# Patient Record
Sex: Female | Born: 1970 | Hispanic: Yes | Marital: Married | Smoking: Never smoker
Health system: Southern US, Community
[De-identification: ages and names within clinical notes are randomized; demographics above are authoritative.]

## PROBLEM LIST (undated history)

## (undated) DIAGNOSIS — E785 Hyperlipidemia, unspecified: Secondary | ICD-10-CM

## (undated) DIAGNOSIS — F419 Anxiety disorder, unspecified: Secondary | ICD-10-CM

## (undated) DIAGNOSIS — E119 Type 2 diabetes mellitus without complications: Secondary | ICD-10-CM

## (undated) HISTORY — PX: NO PAST SURGERIES: SHX2092

## (undated) HISTORY — DX: Hyperlipidemia, unspecified: E78.5

## (undated) HISTORY — DX: Anxiety disorder, unspecified: F41.9

---

## 2004-08-16 ENCOUNTER — Emergency Department (HOSPITAL_COMMUNITY): Admission: EM | Admit: 2004-08-16 | Discharge: 2004-08-16 | Payer: Self-pay | Admitting: Emergency Medicine

## 2008-09-12 ENCOUNTER — Ambulatory Visit: Payer: Self-pay | Admitting: Internal Medicine

## 2008-09-12 ENCOUNTER — Telehealth: Payer: Self-pay | Admitting: Family Medicine

## 2008-09-12 DIAGNOSIS — R079 Chest pain, unspecified: Secondary | ICD-10-CM

## 2008-09-13 ENCOUNTER — Ambulatory Visit: Payer: Self-pay | Admitting: Internal Medicine

## 2008-09-28 ENCOUNTER — Ambulatory Visit: Payer: Self-pay | Admitting: Internal Medicine

## 2009-02-07 ENCOUNTER — Ambulatory Visit: Payer: Self-pay | Admitting: Family Medicine

## 2009-03-06 ENCOUNTER — Ambulatory Visit: Payer: Self-pay | Admitting: Internal Medicine

## 2009-03-28 ENCOUNTER — Telehealth (INDEPENDENT_AMBULATORY_CARE_PROVIDER_SITE_OTHER): Payer: Self-pay | Admitting: *Deleted

## 2009-09-20 ENCOUNTER — Ambulatory Visit: Payer: Self-pay | Admitting: Internal Medicine

## 2009-09-20 DIAGNOSIS — R1013 Epigastric pain: Secondary | ICD-10-CM

## 2009-09-20 DIAGNOSIS — R109 Unspecified abdominal pain: Secondary | ICD-10-CM | POA: Insufficient documentation

## 2009-09-20 DIAGNOSIS — K3189 Other diseases of stomach and duodenum: Secondary | ICD-10-CM

## 2009-09-20 LAB — CONVERTED CEMR LAB
Blood in Urine, dipstick: NEGATIVE
Ketones, urine, test strip: NEGATIVE
Nitrite: NEGATIVE
Urobilinogen, UA: 0.2

## 2009-09-21 ENCOUNTER — Encounter: Payer: Self-pay | Admitting: Internal Medicine

## 2009-09-21 LAB — CONVERTED CEMR LAB: RBC / HPF: NONE SEEN (ref <3)

## 2010-08-26 LAB — CONVERTED CEMR LAB
BUN: 13 mg/dL (ref 6–23)
Beta hcg, urine, semiquantitative: NEGATIVE
Chloride: 106 meq/L (ref 96–112)
Eosinophils Relative: 1 % (ref 0.0–5.0)
Glucose, Bld: 87 mg/dL (ref 70–99)
HCT: 40.5 % (ref 36.0–46.0)
Monocytes Absolute: 0.2 10*3/uL (ref 0.1–1.0)
Monocytes Relative: 2.3 % — ABNORMAL LOW (ref 3.0–12.0)
Neutrophils Relative %: 68.1 % (ref 43.0–77.0)
Platelets: 189 10*3/uL (ref 150–400)
Potassium: 3.7 meq/L (ref 3.5–5.1)
RBC: 4.91 M/uL (ref 3.87–5.11)
WBC: 7.7 10*3/uL (ref 4.5–10.5)

## 2010-08-29 NOTE — Assessment & Plan Note (Signed)
Summary: stomach pains//lch   Vital Signs:  Patient profile:   40 year old female Height:      61.75 inches Weight:      150.4 pounds BMI:     27.83 Temp:     98.2 degrees F BP sitting:   100 / 60  Vitals Entered By: Shary Decamp (September 20, 2009 11:46 AM) CC: abd pain x 2 weeks, +nausea   History of Present Illness: two weeks history of postprandial epigastric pain and nausea has vomited a few times a bitter fluid, no blood  some bloated feeling in the upper abdomen without actual swelling pains sometimes radiates  to the upper chest  review of systems having heartburn from time to time alone w/ her symptoms denies taking Motrin or any Motrin like medications no fever no diarrhea, BMs are normal, stools normal in color appetite is slightly decreased  Current Medications (verified): 1)  None  Allergies (verified): No Known Drug Allergies  Past History:  Past Medical History: G3 P3 on  depoprovera, gets it elsewhere  Past Surgical History: Reviewed history from 09/12/2008 and no changes required. Denies surgical history  Social History: Reviewed history from 03/06/2009 and no changes required. Married 3 kids  original from [place] occupation-- Investment banker, operational   Review of Systems      See HPI  Physical Exam  General:  alert and well-developed.   Eyes:  not pale or jaundice Lungs:  clear to auscultation bilaterally Heart:  normal rate, regular rhythm, and no murmur.   Abdomen:  soft, no distention, no masses, no guarding, and no rigidity.  mild epigastric tenderness. no right upper quadrant tenderness to deep palpation   Impression & Recommendations:  Problem # 1:  DYSPEPSIA (ICD-536.8) dyspeptic symptoms for two weeks gastritis?  PUD?  Gallbladder? Trial with PPIs, see  instructions if no better will consider a ultrasound  and/or  GI referral urinalysis showed trace of leukocytes, symptoms are not consistent with a UTI.  Treat based on  culture  Complete Medication List: 1)  Pantoprazole Sodium 40 Mg Tbec (Pantoprazole sodium) .Marland Kitchen.. 1 by mouth two times a day before meals  Other Orders: UA Dipstick w/o Micro (manual) (62130) Specimen Handling (99000) T-Culture, Urine ([id number]) T-Urine Microscopic ([id number])  Patient Instructions: 1)  take pantoprazole two times a day x 2 weeks , then once a day x 2 months 2)  if is not covered by your insurance you may need to try Prilosec OTC 20mg  (same way to take it) 3)  call in 3 weeks if no better 4)  call any time if you feel worse  Prescriptions: PANTOPRAZOLE SODIUM 40 MG TBEC (PANTOPRAZOLE SODIUM) 1 by mouth two times a day before meals  #60 x 1   Entered and Authorized by:   [name] E. Jyra Lagares MD   Signed by:   Nolon Rod. Jamey Harman MD on 09/20/2009   Method used:   Print then Give to Patient   RxID:   [id number]   Laboratory Results   Urine Tests    Routine Urinalysis   Glucose: negative   (Normal Range: Negative) Bilirubin: negative   (Normal Range: Negative) Ketone: negative   (Normal Range: Negative) Spec. Gravity: >=1.030   (Normal Range: 1.003-1.035) Blood: negative   (Normal Range: Negative) pH: 5.0   (Normal Range: 5.0-8.0) Protein: negative   (Normal Range: Negative) Urobilinogen: 0.2   (Normal Range: 0-1) Nitrite: negative   (Normal Range: Negative) Leukocyte Esterace: moderate   (Normal Range: Negative)

## 2010-09-17 ENCOUNTER — Other Ambulatory Visit: Payer: Self-pay | Admitting: Family Medicine

## 2010-09-17 DIAGNOSIS — R1011 Right upper quadrant pain: Secondary | ICD-10-CM

## 2010-09-20 ENCOUNTER — Ambulatory Visit
Admission: RE | Admit: 2010-09-20 | Discharge: 2010-09-20 | Disposition: A | Discharge status: Home / Self Care | Payer: BC Managed Care – PPO | Source: Ambulatory Visit | Attending: Family Medicine | Admitting: Family Medicine

## 2010-09-20 ENCOUNTER — Encounter (INDEPENDENT_AMBULATORY_CARE_PROVIDER_SITE_OTHER): Payer: Self-pay | Admitting: *Deleted

## 2010-09-20 DIAGNOSIS — R1011 Right upper quadrant pain: Secondary | ICD-10-CM

## 2010-10-04 ENCOUNTER — Ambulatory Visit: Payer: BC Managed Care – PPO | Admitting: Gastroenterology

## 2010-10-04 ENCOUNTER — Encounter (INDEPENDENT_AMBULATORY_CARE_PROVIDER_SITE_OTHER): Payer: Self-pay | Admitting: *Deleted

## 2010-10-09 NOTE — Letter (Signed)
Summary: New Patient letter  [facility]  [address] [place], [place] [address]   Phone: [phone]  Fax: [phone]       10/04/2010 MRN: [id number]  Cheryl Patrick [address] [place], [place]  [address]  Dear Cheryl Patrick,  Welcome to the Gastroenterology Division at [facility].    You are scheduled to see Dr.  Claudette Head on November 12, 2010 at 2:30pm on the 3rd floor at [facility], [address]  We ask that you try to arrive at our office 15 minutes prior to your appointment time to allow for check-in.  We would like you to complete the enclosed self-administered evaluation form prior to your visit and bring it with you on the day of your appointment.  We will review it with you.  Also, please bring a complete list of all your medications or, if you prefer, bring the medication bottles and we will list them.  Please bring your insurance card so that we may make a copy of it.  If your insurance requires a referral to see a specialist, please bring your referral form from your primary care physician.  Co-payments are due at the time of your visit and may be paid by cash, check or credit card.     Your office visit will consist of a consult with your physician (includes a physical exam), any laboratory testing he/she may order, scheduling of any necessary diagnostic testing (e.g. x-ray, ultrasound, CT-scan), and scheduling of a procedure (e.g. Endoscopy, Colonoscopy) if required.  Please allow enough time on your schedule to allow for any/all of these possibilities.    If you cannot keep your appointment, please call [phone] to cancel or reschedule prior to your appointment date.  This allows [place] the opportunity to schedule an appointment for another patient in need of care.  If you do not cancel or reschedule by 5 p.m. the business day prior to your appointment date, you will be charged a $50.00 late cancellation/no-show fee.    Thank you for  choosing [facility] for your medical needs.  We appreciate the opportunity to care for you.  Please visit [place] at our website  to learn more about our practice.                     Sincerely,                                                             The Gastroenterology Division

## 2010-11-12 ENCOUNTER — Ambulatory Visit (INDEPENDENT_AMBULATORY_CARE_PROVIDER_SITE_OTHER): Payer: BC Managed Care – PPO | Admitting: Gastroenterology

## 2010-11-12 ENCOUNTER — Encounter: Payer: Self-pay | Admitting: Gastroenterology

## 2010-11-12 VITALS — BP 100/60 | HR 76 | Ht 62.5 in | Wt 153.4 lb

## 2010-11-12 DIAGNOSIS — K219 Gastro-esophageal reflux disease without esophagitis: Secondary | ICD-10-CM

## 2010-11-12 DIAGNOSIS — R1013 Epigastric pain: Secondary | ICD-10-CM

## 2010-11-12 NOTE — Progress Notes (Signed)
History of Present Illness: This is a 40 year old female with several weeks of epigastric and RUQ pain associated with heartburn and bloating. Patients daughter is with her in the office today and provides all translation for her spanish speaking mother. All symptoms have improved, and almost resolved, on ranitidine 300 mg daily. Her symptoms return immediately if she misses a dose of ranitidine. Abd [place] was negative. She denies, nausea, vomiting, melena, hematochezia, weight loss.  Past Medical History  Diagnosis Date  . Anxiety   . Hyperlipemia    History reviewed. No pertinent past surgical history.  reports that she has never smoked. She has never used smokeless tobacco. She reports that she does not drink alcohol or use illicit drugs. family history includes Diabetes in her father; Hypertension in her mother; and Lymphoma in her mother. No Known Allergies    Outpatient Encounter Prescriptions as of 11/12/2010  Medication Sig Dispense Refill  . ranitidine (ZANTAC) 300 MG tablet Take 300 mg by mouth daily.        Marland Kitchen DISCONTD: pantoprazole (PROTONIX) 40 MG tablet Take 40 mg by mouth 2 (two) times daily.         Review of Systems: Pertinent positive and negative review of systems were noted in the above HPI section. All other review of systems were otherwise negative.  Physical Exam: General: Well developed , well nourished, no acute distress Head: Normocephalic and atraumatic Eyes:  sclerae anicteric, EOMI Ears: Normal auditory acuity Mouth: No deformity or lesions Neck: Supple, no masses or thyromegaly Lungs: Clear throughout to auscultation Heart: Regular rate and rhythm; no murmurs, rubs or bruits Abdomen: Soft, mild epigastric and RUQ tenderness, and non distended. No masses, hepatosplenomegaly or hernias noted. Normal Bowel sounds Musculoskeletal: Symmetrical with no gross deformities  Skin: No lesions on visible extremities Pulses:  Normal pulses noted Extremities: No clubbing,  cyanosis, edema or deformities noted Neurological: Alert oriented x 4, grossly nonfocal Cervical Nodes:  No significant cervical adenopathy Inguinal Nodes: No significant inguinal adenopathy Psychological:  Alert and cooperative. Normal mood and affect  Assessment and Recommendations:  1. Probable GERD. R/O gastritis, ulcer disease. Continue ranitidine 300 mg daily and antireflux measures. The risks, benefits, and alternatives to endoscopy with possible biopsy and possible dilation were discussed with the patient and they consent to proceed.

## 2010-11-12 NOTE — Patient Instructions (Signed)
You have been scheduled for a Upper Endoscopy. Separate instruction sheet given. Patient advised to avoid spicy, acidic, citrus, chocolate, mints, fruit and fruit juices.  Limit the intake of caffeine, alcohol and Soda.  Don't exercise too soon after eating.  Don't lie down within 3-4 hours of eating.  Elevate the head of your bed. Stay on ranitidine.  Cc: Lesle Chris, MD

## 2010-11-13 ENCOUNTER — Encounter: Payer: Self-pay | Admitting: Gastroenterology

## 2010-12-07 ENCOUNTER — Other Ambulatory Visit: Payer: BC Managed Care – PPO | Admitting: Gastroenterology

## 2010-12-07 ENCOUNTER — Encounter: Payer: Self-pay | Admitting: Gastroenterology

## 2010-12-07 ENCOUNTER — Ambulatory Visit (AMBULATORY_SURGERY_CENTER): Payer: BC Managed Care – PPO | Admitting: Gastroenterology

## 2010-12-07 VITALS — BP 118/88 | HR 73 | Temp 96.9°F | Resp 19 | Ht 65.0 in | Wt 151.0 lb

## 2010-12-07 DIAGNOSIS — K219 Gastro-esophageal reflux disease without esophagitis: Secondary | ICD-10-CM

## 2010-12-07 DIAGNOSIS — R1013 Epigastric pain: Secondary | ICD-10-CM

## 2010-12-07 MED ORDER — SODIUM CHLORIDE 0.9 % IV SOLN: 500.0000 mL | INTRAVENOUS | Status: DC

## 2010-12-07 NOTE — Patient Instructions (Signed)
Please read the handouts given to you by your recovery room nurse.  Continue your reflux medicine, and any other medicines you take routinely.  Follow-up with the Dr. When needed.   If you have any questions or concerns, please call us at [phone]. Thank-you.

## 2010-12-10 ENCOUNTER — Telehealth: Payer: Self-pay | Admitting: *Deleted

## 2010-12-10 NOTE — Telephone Encounter (Signed)
Pt unavailable, spoke with daughter Renea Ee who is pt's Nurse, learning disability as well. She states that pt is well, without problems and if she has questions she will call us back.

## 2011-12-14 ENCOUNTER — Emergency Department (HOSPITAL_COMMUNITY): Payer: No Typology Code available for payment source

## 2011-12-14 ENCOUNTER — Emergency Department (HOSPITAL_COMMUNITY)
Admission: EM | Admit: 2011-12-14 | Discharge: 2011-12-14 | Disposition: A | Discharge status: Home / Self Care | Payer: No Typology Code available for payment source | Attending: Emergency Medicine | Admitting: Emergency Medicine

## 2011-12-14 ENCOUNTER — Encounter (HOSPITAL_COMMUNITY): Payer: Self-pay | Admitting: Emergency Medicine

## 2011-12-14 DIAGNOSIS — R079 Chest pain, unspecified: Secondary | ICD-10-CM

## 2011-12-14 DIAGNOSIS — R6884 Jaw pain: Secondary | ICD-10-CM | POA: Insufficient documentation

## 2011-12-14 MED ORDER — HYDROCODONE-ACETAMINOPHEN 5-325 MG PO TABS
1.0000 | ORAL_TABLET | Freq: Once | ORAL | Status: AC
  Administered 2011-12-14: 1 via ORAL
  Filled 2011-12-14: qty 1

## 2011-12-14 MED ORDER — HYDROCODONE-ACETAMINOPHEN 5-325 MG PO TABS: 1.0000 | ORAL_TABLET | Freq: Once | ORAL | Status: AC
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
## 2011-12-14 NOTE — Discharge Instructions (Signed)
Your xrays did not show any broken bones or signs of damage to your heart and lungs. Your jaw xray was normal. It is important for you to apply ice to the area on your chest several times a day to help with the pain. Take the pain medication as needed. Follow up with your family doctor if not improving. Return to the ER if you have worsening pain, trouble breathing, or any other worrisome symptoms.  RESOURCE GUIDE  Dental Problems  Patients with Medicaid: [facility] [phone] [address]                                           [phone] [address] Phone:  [phone]                                                  Phone:  [phone]  If unable to pay or uninsured, contact:  [facility] or [facility]. to become qualified for the adult dental clinic.  Chronic Pain Problems Contact [facility]  [phone] Patients need to be referred by their primary care doctor.  Insufficient Money for Medicine Contact United Way:  call "211" or [facility] [phone].  No Primary Care Doctor Call [facility]  [phone] Other agencies that provide inexpensive medical care    [facility]  [phone]    [facility]  [phone]    [facility]  [phone]    [facility]  [phone]    [facility]  [phone]    [facility]  [phone]  Psychological Services [facility]  [phone] [facility] Services  [phone] [facility]   [phone] (emergency services [phone])  Substance Abuse Resources [facility]  [phone] [facility] [phone] [facility] [phone] Floydene Flock [phone] Residential & Outpatient Substance Abuse Program  [phone]  Abuse/Neglect [facility] Child Abuse Hotline 413-389-8850 [facility] Child Abuse Hotline 425-445-1197 (After  Hours)  Emergency Shelter [facility] [phone]  Maternity Homes [facility] [place] [facility] [phone] [facility] Services [phone]  MRSA Hotline #:   781-042-8257    [facility] Resources  [facility]     United [employer]                          [facility] Dept. [address] [place]                       [address]      [address] [place] [address]  Patrecia Pace  [facility] Phone:  [phone]                                   Phone:  [phone]                 Phone:  [phone]  [facility] Phone:  [phone]  [facility] Child Abuse Hotline 218-809-7817 [phone] (After Hours)  Dolor en el pecho (inespecfico) (Chest Pain, Nonspecific) Frecuentemente es difcil dar un diagnstico especfico de la causa de un dolor en el pecho. Siempre existe una posibilidad de que el dolor est relacionado con algo ms grave como un ataque cardaco o un cogulo en los pulmones. Necesitar realizar un seguimiento con el mdico para Catering manager.  CAUSAS  Acidez.   Neumona o bronquitis.   Ansiedad o estrs.   Una inflamacin de la zona que rodea al corazn (pericarditis) o a los pulmones (pleuritis, pleuresa).   Un cogulo sanguneo en el pulmn.   Pulmones colapsados (neumotrax). Puede aparecer de Gus Height repentina por s solo (neumotrax espontneo) o por un traumatismo en el pecho.   Culebrilla (virus del herpes zster).  Las paredes del pecho estn compuestas de [place], msculos y Dietitian. Cualquiera de estos puede ser fuente del dolor.  Puede haber una contusin en los huesos debido a una lesin.   Puede haber un esguince en los msculos o el cartlago ocasionado por la tos o un esfuerzo.   El cartlago tambin puede verse afectado por una inflamacin y Engineer, agricultural (costocondritis).   DIAGNSTICO Puede ser necesario realizar anlisis de laboratorio u otros estudios tales como radiografas, Materials engineer, prueba de esfuerzo, o diagnstico por imgenes para el corazn para determinar la causa de su dolor.  TRATAMIENTO  El tratamiento depender de la causa que provoque el dolor en el pecho. El tratamiento pueden incluir:   Bloqueadores de cido para la acidez estomacal.   Medicamentos antiinflamatorios.   Analgsicos para las enfermedades inflamatorias.   Antibiticos si existe infeccin.   Podrn aconsejarle que modifique su estilo de vida. Esto incluye dejar de fumar y evitar el alcohol, la cafena y el chocolate.   Se le aconsejar que duerma con la cabeza levantada [place]). Esto reduce la probabilidad de que el cido vuelva hacia atrs desde el estmago hasta el esfago.   La mayora de las veces, Chief Technology Officer en el pecho no especfico mejora dentro de los 2 a 3 das con reposo y medicamentos para Technical sales engineer.  INSTRUCCIONES PARA EL CUIDADO DOMICILIARIO  Si le prescriben antibiticos, tmelos tal como se le indic. Tmelos todos, aunque se sienta mejor.   Durante los [address], evite la actividad fsica que le hace Civil engineer, contracting. Contine con las actividades fsicas tal como se le indic.   No fume.   Evite consumir alcohol.   Slo tome medicamentos de Sales promotion account executive o prescriptos para Primary school teacher, las molestias o bajar la fiebre segn las indicaciones de su mdico.   Siga las indicaciones del profesional que lo asiste para un mayor control si los problemas persisten.   Cumpla con todas las visitas de control programadas. Si no lo hace, podr desarrollar problemas permanentes (crnicos) relacionados con el dolor. Si tiene algn problema para asistir a la cita, debe comunicarse con el establecimiento para obtener asistencia.  SOLICITE ATENCIN MDICA SI:  Tiene problemas que considere que pueden ser efectos secundarios de los medicamentos  que  tomaClint Lipps con cuidado las recomendaciones para su medicacin.   El dolor en el pecho contina incluso despus de haber seguido el tratamiento.   Observa una erupcin en el pecho, que presenta ampollas.  SOLICITE ATENCIN MDICA DE INMEDIATO SI:  El dolor en el pecho aumenta o se extiende al brazo, cuello, mandbula, espalda o abdomen.   Le falta el aire, aumenta la tos o tose y [place].   Tiene dolor intenso en la espalda o el abdomen, nuseas o vmitos.   Presenta debilidad, desmayos o escalofros.   Tiene fiebre.  ESTO ES UNA EMERGENCIA. No espere a que el dolor se vaya. Pida ayuda mdica de inmediato. Comunquese con el servicio de urgencias de su localidad (911 en los [place]). No maneje solo [employer]. EST SEGURO QUE:   Comprende las instrucciones para el alta mdica.   Controlar su enfermedad.   Solicitar atencin mdica de inmediato segn las indicaciones.  Document Released: 07/15/2005 Document Revised: 07/04/2011 [facility] Patient Information 2012 [place], [place].

## 2011-12-14 NOTE — ED Provider Notes (Signed)
Medical screening examination/treatment/procedure(s) were performed by non-physician practitioner and as supervising physician I was immediately available for consultation/collaboration.   Dayton Bailiff, MD 12/14/11 [phone]
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
## 2012-01-01 ENCOUNTER — Ambulatory Visit (INDEPENDENT_AMBULATORY_CARE_PROVIDER_SITE_OTHER): Payer: BC Managed Care – PPO | Admitting: Family Medicine

## 2012-01-01 VITALS — BP 120/82 | HR 75 | Temp 98.1°F | Resp 16 | Ht 61.5 in | Wt 155.8 lb

## 2012-01-01 DIAGNOSIS — T671XXA Heat syncope, initial encounter: Secondary | ICD-10-CM

## 2012-01-01 DIAGNOSIS — IMO0001 Reserved for inherently not codable concepts without codable children: Secondary | ICD-10-CM

## 2012-01-01 DIAGNOSIS — Z0000 Encounter for general adult medical examination without abnormal findings: Secondary | ICD-10-CM

## 2012-01-01 DIAGNOSIS — Z309 Encounter for contraceptive management, unspecified: Secondary | ICD-10-CM

## 2012-01-01 DIAGNOSIS — S20219A Contusion of unspecified front wall of thorax, initial encounter: Secondary | ICD-10-CM

## 2012-01-01 LAB — COMPREHENSIVE METABOLIC PANEL
ALT: 22 U/L (ref 0–35)
CO2: 26 mEq/L (ref 19–32)
Calcium: 9.1 mg/dL (ref 8.4–10.5)
Chloride: 107 mEq/L (ref 96–112)
Potassium: 4 mEq/L (ref 3.5–5.3)
Sodium: 140 mEq/L (ref 135–145)
Total Protein: 7.4 g/dL (ref 6.0–8.3)

## 2012-01-01 LAB — POCT CBC
Hemoglobin: 14.4 g/dL (ref 12.2–16.2)
MPV: 10.4 fL (ref 0–99.8)
POC Granulocyte: 4.4 (ref 2–6.9)
POC MID %: 5.8 %M (ref 0–12)
RBC: 5.26 M/uL (ref 4.04–5.48)
WBC: 7.4 10*3/uL (ref 4.6–10.2)

## 2012-01-01 LAB — LIPID PANEL
HDL: 34 mg/dL — ABNORMAL LOW (ref >39)
LDL Cholesterol: 86 mg/dL (ref 0–99)

## 2012-01-01 LAB — POCT URINE PREGNANCY: Preg Test, Ur: NEGATIVE

## 2012-01-01 MED ORDER — MEDROXYPROGESTERONE ACETATE 150 MG/ML IM SUSP
150.0000 mg | Freq: Once | INTRAMUSCULAR | Status: AC
  Administered 2012-01-01: 150 mg via INTRAMUSCULAR

## 2012-01-01 MED ORDER — MELOXICAM 7.5 MG PO TABS: 7.5000 mg | ORAL_TABLET | Freq: Every day | ORAL | Status: DC

## 2012-01-01 NOTE — Patient Instructions (Signed)
I will be in touch with your labs.  You need to schedule a mammogram now that you are 41 years old!  There are several places in town where you can schedule a mammogram at your convenience.
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
## 2012-01-03 LAB — PAP IG, CT-NG, RFX HPV ASCU

## 2012-01-07 ENCOUNTER — Other Ambulatory Visit: Payer: Self-pay | Admitting: Family Medicine

## 2012-01-07 ENCOUNTER — Encounter: Payer: Self-pay | Admitting: Family Medicine

## 2012-01-07 DIAGNOSIS — Z1231 Encounter for screening mammogram for malignant neoplasm of breast: Secondary | ICD-10-CM

## 2012-01-13 ENCOUNTER — Ambulatory Visit
Admission: RE | Admit: 2012-01-13 | Discharge: 2012-01-13 | Disposition: A | Discharge status: Home / Self Care | Payer: BC Managed Care – PPO | Source: Ambulatory Visit | Attending: Family Medicine | Admitting: Family Medicine

## 2012-01-13 DIAGNOSIS — Z1231 Encounter for screening mammogram for malignant neoplasm of breast: Secondary | ICD-10-CM

## 2012-02-18 ENCOUNTER — Ambulatory Visit (INDEPENDENT_AMBULATORY_CARE_PROVIDER_SITE_OTHER): Payer: BC Managed Care – PPO | Admitting: Family Medicine

## 2012-02-18 VITALS — BP 111/70 | HR 80 | Temp 98.6°F | Resp 16 | Ht 61.58 in | Wt 155.6 lb

## 2012-02-18 DIAGNOSIS — L600 Ingrowing nail: Secondary | ICD-10-CM

## 2012-02-18 DIAGNOSIS — M79676 Pain in unspecified toe(s): Secondary | ICD-10-CM

## 2012-02-18 DIAGNOSIS — M79609 Pain in unspecified limb: Secondary | ICD-10-CM

## 2012-02-18 MED ORDER — TRAMADOL HCL 50 MG PO TABS: 50.0000 mg | ORAL_TABLET | Freq: Three times a day (TID) | ORAL | Status: AC | PRN

## 2012-02-18 NOTE — Progress Notes (Signed)
  Subjective:    Patient ID: Cheryl Patrick, female    DOB: [birth year], 41 y.o.   MRN: [id number]  HPI    Review of Systems     Objective:   Physical Exam  Consent obtained - digital block with  2% lido and marcaine for anesthesia. Betadine prep. Nail removed and xeroform gauze placed.  Wound care d/w pt.       Assessment & Plan:

## 2012-02-18 NOTE — Progress Notes (Signed)
[facility]:  Office Visit  Chief Complaint:  Chief Complaint  Patient presents with  . Ingrown Toenail    x 3 days    HPI: Cheryl Patrick is a 41 y.o. female who complains of  Left big toe pain. She has had it for 3 days after trying to remove thick nail with nail clippers. She  thinks she has a fungal infection of the big toes and has been trying to remove the thickened nail. Pain on lateral aspect of big toe.   Past Medical History  Diagnosis Date  . Anxiety   . Hyperlipemia    History reviewed. No pertinent past surgical history. History   Social History  . Marital Status: Married    Spouse Name: N/A    Number of Children: 3  . Years of Education: N/A   Occupational History  . Shoe sales   .     Social History Main Topics  . Smoking status: Never Smoker   . Smokeless tobacco: Never Used  . Alcohol Use: No  . Drug Use: No  . Sexually Active: None   Other Topics Concern  . None   Social History Narrative  . None   Family History  Problem Relation Age of Onset  . Lymphoma Mother   . Hypertension Mother   . Diabetes Father    No Known Allergies Prior to Admission medications   Not on File     ROS: The patient denies fevers, chills, night sweats, unintentional weight loss, chest pain, palpitations, wheezing, dyspnea on exertion, nausea, vomiting, abdominal pain, dysuria, hematuria, melena, numbness, weakness, or tingling.   All other systems have been reviewed and were otherwise negative with the exception of those mentioned in the HPI and as above.    PHYSICAL EXAM: Filed Vitals:   02/18/12 1608  BP: 111/70  Pulse: 80  Temp: 98.6 F (37 C)  Resp: 16   Filed Vitals:   02/18/12 1608  Height: 5' 1.58" (1.564 m)  Weight: 155 lb 9.6 oz (70.58 kg)   Body mass index is 28.85 kg/(m^2).  General: Alert, no acute distress HEENT:  Normocephalic, atraumatic, oropharynx patent.  Cardiovascular:  Regular rate and  rhythm, no rubs murmurs or gallops.  No Carotid bruits, radial pulse intact. No pedal edema.  Respiratory: Clear to auscultation bilaterally.  No wheezes, rales, or rhonchi.  No cyanosis, no use of accessory musculature GI: No organomegaly, abdomen is soft and non-tender, positive bowel sounds.  No masses. Skin: No rashes. Neurologic: Facial musculature symmetric. Psychiatric: Patient is appropriate throughout our interaction. Lymphatic: No cervical lymphadenopathy Musculoskeletal: Gait intact. Left foot/toe: ROM sensation intact. + nail damage due to nail clippers. + tenderness on lateral aspect of left great toe. + DP. No warmth.   LABS: Results for orders placed in visit on 01/01/12  POCT CBC      Component Value Range   WBC 7.4  4.6 - 10.2 K/uL   Lymph, poc 2.6  0.6 - 3.4   POC LYMPH PERCENT 35.0  10 - 50 %L   MID (cbc) 0.4  0 - 0.9   POC MID % 5.8  0 - 12 %M   POC Granulocyte 4.4  2 - 6.9   Granulocyte percent 59.2  37 - 80 %G   RBC 5.26  4.04 - 5.48 M/uL   Hemoglobin 14.4  12.2 - 16.2 g/dL   HCT, POC 16.1  09.6 - 47.9 %   MCV 84.2  80 -  97 fL   MCH, POC 27.4  27 - 31.2 pg   MCHC 32.5  31.8 - 35.4 g/dL   RDW, POC 16.1     Platelet Count, POC 235  142 - 424 K/uL   MPV 10.4  0 - 99.8 fL  COMPREHENSIVE METABOLIC PANEL      Component Value Range   Sodium 140  135 - 145 mEq/L   Potassium 4.0  3.5 - 5.3 mEq/L   Chloride 107  96 - 112 mEq/L   CO2 26  19 - 32 mEq/L   Glucose, Bld 102 (*) 70 - 99 mg/dL   BUN 18  6 - 23 mg/dL   Creat 0.96  0.45 - 4.09 mg/dL   Total Bilirubin 0.4  0.3 - 1.2 mg/dL   Alkaline Phosphatase 79  39 - 117 U/L   AST 19  0 - 37 U/L   ALT 22  0 - 35 U/L   Total Protein 7.4  6.0 - 8.3 g/dL   Albumin 4.6  3.5 - 5.2 g/dL   Calcium 9.1  8.4 - 81.1 mg/dL  POCT URINE PREGNANCY      Component Value Range   Preg Test, Ur Negative    TSH      Component Value Range   TSH 1.474  0.350 - 4.500 uIU/mL  LIPID PANEL      Component Value Range   Cholesterol 158   0 - 200 mg/dL   Triglycerides 914 (*) <150 mg/dL   HDL 34 (*) >78 mg/dL   Total CHOL/HDL Ratio 4.6     VLDL 38  0 - 40 mg/dL   LDL Cholesterol 86  0 - 99 mg/dL  PAP IG, CT-NG, RFX HPV ASCU      Component Value Range   Specimen adequacy:       FINAL DIAGNOSIS:       COMMENTS:       Cytotechnologist:       Chlamydia Probe Amp NEGATIVE     GC Probe Amp NEGATIVE       EKG/XRAY:   Primary read interpreted by Dr. Conley Rolls at [facility].   ASSESSMENT/PLAN: Encounter Diagnosis  Name Primary?  . Ingrown left big toenail Yes   Removed entire toe nail Rx: Tramadol for pain F/u and wound care as directed   Cheryl Pourciau PHUONG, DO 02/18/2012 4:22 PM

## 2012-11-09 ENCOUNTER — Ambulatory Visit: Payer: BC Managed Care – PPO

## 2012-11-09 ENCOUNTER — Ambulatory Visit (INDEPENDENT_AMBULATORY_CARE_PROVIDER_SITE_OTHER): Payer: BC Managed Care – PPO | Admitting: Family Medicine

## 2012-11-09 VITALS — BP 110/80 | HR 77 | Temp 98.2°F | Resp 18 | Ht 62.0 in | Wt 159.0 lb

## 2012-11-09 DIAGNOSIS — R079 Chest pain, unspecified: Secondary | ICD-10-CM

## 2012-11-09 DIAGNOSIS — Z3040 Encounter for surveillance of contraceptives, unspecified: Secondary | ICD-10-CM

## 2012-11-09 DIAGNOSIS — K219 Gastro-esophageal reflux disease without esophagitis: Secondary | ICD-10-CM

## 2012-11-09 LAB — COMPREHENSIVE METABOLIC PANEL
AST: 16 U/L (ref 0–37)
Albumin: 4.3 g/dL (ref 3.5–5.2)
Alkaline Phosphatase: 68 U/L (ref 39–117)
BUN: 17 mg/dL (ref 6–23)
Creat: 0.69 mg/dL (ref 0.50–1.10)
Potassium: 3.7 mEq/L (ref 3.5–5.3)
Total Bilirubin: 0.5 mg/dL (ref 0.3–1.2)

## 2012-11-09 LAB — POCT CBC
Hemoglobin: 13.9 g/dL (ref 12.2–16.2)
Lymph, poc: 2.4 (ref 0.6–3.4)
MCHC: 31.6 g/dL — AB (ref 31.8–35.4)
MPV: 9.6 fL (ref 0–99.8)
POC Granulocyte: 6.4 (ref 2–6.9)
POC MID %: 6.3 %M (ref 0–12)
Platelet Count, POC: 224 10*3/uL (ref 142–424)

## 2012-11-09 LAB — LIPID PANEL
HDL: 36 mg/dL — ABNORMAL LOW (ref >39)
LDL Cholesterol: 75 mg/dL (ref 0–99)
Total CHOL/HDL Ratio: 4.1 Ratio
Triglycerides: 191 mg/dL — ABNORMAL HIGH (ref <150)
VLDL: 38 mg/dL (ref 0–40)

## 2012-11-09 LAB — POCT GLYCOSYLATED HEMOGLOBIN (HGB A1C): Hemoglobin A1C: 5.2

## 2012-11-09 MED ORDER — LEVONORGESTREL-ETHINYL ESTRAD 0.1-20 MG-MCG PO TABS: 1.0000 | ORAL_TABLET | Freq: Every day | ORAL | Status: DC

## 2012-11-09 MED ORDER — RANITIDINE HCL 150 MG PO TABS: 150.0000 mg | ORAL_TABLET | Freq: Two times a day (BID) | ORAL | Status: DC

## 2012-11-09 NOTE — Patient Instructions (Addendum)
Please try ibuprofen or tylenol for your chest wall pain.  If this continues or gets worse please come and see [place] or otherwise seek care.    You may start the birth control pill now or after your next period.  Remember to continue to use condoms for the first 2 weeks.    I will be in touch with the rest of your labs when they come in.

## 2012-11-09 NOTE — Progress Notes (Signed)
[facility] [address] [place] [address] [phone]  Date:  11/09/2012   Name:  Cheryl Patrick   DOB:  07-Mar-1971   MRN:  [id number]  PCP:  Anne Ng    Chief Complaint: Annual Exam   History of Present Illness:  Cheryl Patrick is a 41 y.o. very pleasant female patient who presents with the following:  Here today seeking a CPE. However, she was seen by myself for a CPE/ pap last June and also has complaint of chest pain today, so we converted the visit to a regular visit.    Yesterday she noted chest pain in the right side of her chest.  She will also get some right shoulder pain, and "shakiness" in her right hand when this occurs.  She had 2 episodes of this pain yesterday- the first lasted for 2 hours, and then she had another 3 hour episode of CP last night.  She has no CP now, but her chest still feels "sore."   She also had CP in the past- this was accompanied with back pain and got better with deep breath or walking. She did not see a heart doctor or have a stress test, and the pain resolved.    She also has lower back pain. This has been going on for about 3 weeks.  The area "burns."    She was using Depo- provera for contraception in the past.  Her last shot was about one year ago.  She is using condoms right now but would like to have another method of contraception.  Getting in for her depo- shots is difficult for her, so she would like to use OCP instead.      She has never been a smoker.  She has never had a DVT/ PE, no history of cancer.  No history of migraine.    LMP was 10/27/12.  Condoms with all intercourse since then.    She is also concerned about a problem with her left great toenail.  The toenail is thick and she does not like the appearance, but it is not painful.    She has also used zantac in the past for gastritis and would like an rx for this to have on hand.    She is also concerned about  lack of sexual desire and wonders if anything can be done about this.    Past Medical History  Diagnosis Date  . Anxiety   . Hyperlipemia     History reviewed. No pertinent past surgical history.  History  Substance Use Topics  . Smoking status: Never Smoker   . Smokeless tobacco: Never Used  . Alcohol Use: No    Family History  Problem Relation Age of Onset  . Lymphoma Mother   . Hypertension Mother   . Diabetes Father     No Known Allergies  Medication list has been reviewed and updated.  No current outpatient prescriptions on file prior to visit.   Current Facility-Administered Medications on File Prior to Visit  Medication Dose Route Frequency Provider Last Rate Last Dose  . 0.9 %  sodium chloride infusion  500 mL Intravenous Continuous Meryl Dare, MD        Review of Systems:  As per HPI- otherwise negative. There is no family history of CAD that she is aware of  Physical Examination: Filed Vitals:   11/09/12 1211  BP: 110/80  Pulse: 77  Temp: 98.2 F (36.8  C)  Resp: 18   Filed Vitals:   11/09/12 1211  Height: 5\' 2"  (1.575 m)  Weight: 159 lb (72.122 kg)   Body mass index is 29.07 kg/(m^2). Ideal Body Weight: Weight in (lb) to have BMI = 25: 136.4  GEN: WDWN, NAD, Non-toxic, A & O x 3 HEENT: Atraumatic, Normocephalic. Neck supple. No masses, No LAD.  Bilateral TM wnl, oropharynx normal.  PEERL,EOMI.   Ears and Nose: No external deformity. CV: RRR, No M/G/R. No JVD. No thrill. No extra heart sounds. PULM: CTA B, no wheezes, crackles, rhonchi. No retractions. No resp. distress. No accessory muscle use. ABD: S, NT, ND, +BS. No rebound. No HSM. EXTR: No c/c/e NEURO Normal gait.  PSYCH: Normally interactive. Conversant. Not depressed or anxious appearing.  Calm demeanor.  Able to reproduce her CP by pressing on the pectoralis muscle (right) and by manipulating the right shoulder, especially with external rotation.  The CP is clearly reproducible  and is MSK in origin  [facility] reading (PRIMARY) by  Dr. Patsy Lager. CXR:  NAD, normal CXR   CHEST - 2 VIEW  Comparison: 12/14/2011  Findings: The heart and pulmonary vascularity are within normal limits. The lungs are clear bilaterally. No bony abnormality is seen.  IMPRESSION: No acute abnormality noted.   EKG: NSR, no ST elevation or depression.  Compared to EKG from 12/2009- no change noted.     Results for orders placed in visit on 11/09/12  POCT CBC      Result Value Range   WBC 9.4  4.6 - 10.2 K/uL   Lymph, poc 2.4  0.6 - 3.4   POC LYMPH PERCENT 26.0  10 - 50 %L   MID (cbc) 0.6  0 - 0.9   POC MID % 6.3  0 - 12 %M   POC Granulocyte 6.4  2 - 6.9   Granulocyte percent 67.7  37 - 80 %G   RBC 5.17  4.04 - 5.48 M/uL   Hemoglobin 13.9  12.2 - 16.2 g/dL   HCT, POC 16.1  09.6 - 47.9 %   MCV 85.1  80 - 97 fL   MCH, POC 26.9 (*) 27 - 31.2 pg   MCHC 31.6 (*) 31.8 - 35.4 g/dL   RDW, POC 04.5     Platelet Count, POC 224  142 - 424 K/uL   MPV 9.6  0 - 99.8 fL  POCT GLYCOSYLATED HEMOGLOBIN (HGB A1C)      Result Value Range   Hemoglobin A1C 5.2    POCT URINE PREGNANCY      Result Value Range   Preg Test, Ur Negative      Assessment and Plan: Chest pain - Plan: POCT CBC, POCT glycosylated hemoglobin (Hb A1C), Comprehensive metabolic panel, TSH, Lipid panel, EKG 12-Lead, DG Chest 2 View  Contraceptive use - Plan: POCT urine pregnancy, levonorgestrel-ethinyl estradiol (AVIANE,ALESSE,LESSINA) 0.1-20 MG-MCG tablet  GERD (gastroesophageal reflux disease) - Plan: ranitidine (ZANTAC) 150 MG tablet  Converted CPE visit to OV today.  Her CP is MSK in origin- I am easily able to reproduce her pain by pressing on her chest.  Reassurance, and asked her to let us know if any change in her symptoms Refilled zantac to use as needed rx for OCP, and discussed use.  She has a fungal nail, right great toenail. At this time she is comfortable with observation.  If she prefers treatment we can  culture and use lamisil at a later date.   Encouraged her to keep  communication open with her husband to try and improve their sexual relationship.    Signed Abbe Amsterdam, MD

## 2012-11-10 ENCOUNTER — Encounter: Payer: Self-pay | Admitting: Family Medicine

## 2013-03-19 ENCOUNTER — Telehealth: Payer: Self-pay

## 2013-03-19 DIAGNOSIS — K219 Gastro-esophageal reflux disease without esophagitis: Secondary | ICD-10-CM

## 2013-03-19 MED ORDER — RANITIDINE HCL 150 MG PO TABS: 150.0000 mg | ORAL_TABLET | Freq: Two times a day (BID) | ORAL | Status: DC

## 2013-03-19 NOTE — Telephone Encounter (Signed)
Patient needs her rx for gastritis (i think its the zantac) called into [facility] on [address]. She originally had it sent to the [facility] on [address] but they lost her RX and told her to contact us to send it again. But this time she wants it sent to [facility]. Please call back at [phone]

## 2013-03-19 NOTE — Telephone Encounter (Signed)
Ranitidine sent

## 2013-10-23 ENCOUNTER — Ambulatory Visit: Payer: No Typology Code available for payment source

## 2013-10-23 ENCOUNTER — Ambulatory Visit (INDEPENDENT_AMBULATORY_CARE_PROVIDER_SITE_OTHER): Payer: No Typology Code available for payment source | Admitting: Emergency Medicine

## 2013-10-23 VITALS — BP 130/82 | HR 110 | Temp 98.6°F | Resp 17 | Ht 62.0 in | Wt 165.0 lb

## 2013-10-23 DIAGNOSIS — J189 Pneumonia, unspecified organism: Secondary | ICD-10-CM

## 2013-10-23 DIAGNOSIS — R05 Cough: Secondary | ICD-10-CM

## 2013-10-23 DIAGNOSIS — R079 Chest pain, unspecified: Secondary | ICD-10-CM

## 2013-10-23 DIAGNOSIS — R0602 Shortness of breath: Secondary | ICD-10-CM

## 2013-10-23 DIAGNOSIS — R059 Cough, unspecified: Secondary | ICD-10-CM

## 2013-10-23 LAB — GLUCOSE, POCT (MANUAL RESULT ENTRY): POC Glucose: 88 mg/dl (ref 70–99)

## 2013-10-23 LAB — POCT CBC
Granulocyte percent: 64 %G (ref 37–80)
HCT, POC: 43.4 % (ref 37.7–47.9)
HEMOGLOBIN: 13.9 g/dL (ref 12.2–16.2)
LYMPH, POC: 3 (ref 0.6–3.4)
MCH: 27.3 pg (ref 27–31.2)
MCHC: 32 g/dL (ref 31.8–35.4)
MCV: 85.3 fL (ref 80–97)
MID (CBC): 0.5 (ref 0–0.9)
MPV: 9.6 fL (ref 0–99.8)
PLATELET COUNT, POC: 260 10*3/uL (ref 142–424)
POC GRANULOCYTE: 6.1 (ref 2–6.9)
POC LYMPH %: 31.1 % (ref 10–50)
POC MID %: 4.9 % (ref 0–12)
RBC: 5.09 M/uL (ref 4.04–5.48)
RDW, POC: 14.2 %
WBC: 9.6 10*3/uL (ref 4.6–10.2)

## 2013-10-23 MED ORDER — PREDNISONE 20 MG PO TABS: ORAL_TABLET | ORAL | Status: DC

## 2013-10-23 MED ORDER — IPRATROPIUM BROMIDE 0.02 % IN SOLN
0.5000 mg | Freq: Once | RESPIRATORY_TRACT | Status: AC
  Administered 2013-10-23: 0.5 mg via RESPIRATORY_TRACT

## 2013-10-23 MED ORDER — BENZONATATE 100 MG PO CAPS: 100.0000 mg | ORAL_CAPSULE | Freq: Three times a day (TID) | ORAL | Status: DC | PRN

## 2013-10-23 MED ORDER — ALBUTEROL SULFATE HFA 108 (90 BASE) MCG/ACT IN AERS: 2.0000 | INHALATION_SPRAY | RESPIRATORY_TRACT | Status: DC | PRN

## 2013-10-23 MED ORDER — ALBUTEROL SULFATE (2.5 MG/3ML) 0.083% IN NEBU
2.5000 mg | INHALATION_SOLUTION | Freq: Once | RESPIRATORY_TRACT | Status: AC
  Administered 2013-10-23: 2.5 mg via RESPIRATORY_TRACT

## 2013-10-23 NOTE — Progress Notes (Signed)
This chart was scribed for Viviann Spare A. Cleta Alberts, MD by Ashley Jacobs, [facility] Scribe. The patient was seen in room and the patient's care was started at 5:39 PM.   Cough Associated symptoms include chest pain, myalgias and shortness of breath.  Chest Pain  Associated symptoms include abdominal pain, back pain, a cough, shortness of breath and vomiting.  Abdominal Pain Associated symptoms include myalgias and vomiting.  Shortness of Breath Associated symptoms include abdominal pain, chest pain and vomiting.   HPI Comments: Cheryl Patrick is a 43 y.o. female who arrives to the [facility] complaining of constant, unchanged cough, chest pain, abdominal pain and SOB. Pt had a fever a week ago which is only present in the morning. She is vomiting due to excessive coughing. Pt went to Dr. Quintella Reichert and was treated for bronchitis earlier this week. She reports taking the medications rx but has not experienced any relief. Pt has been in the [place] for the past two years. Denies any prior similar episodes. Denies any allergies to medications. Pt does not smoke tobacco. Nothing seems to make the symptoms better. Pt mentions having rib and back pain. She has a past medical hx of anxiety and hyperlipemia. Denies hx of asthma and family hx of asthma. Pt had sick contact with her children who had similar sx.  Denies pregnancy and reports taking birth control.  Pt's daughter translated during exam.  Review of Systems  Respiratory: Positive for cough and shortness of breath.   Cardiovascular: Positive for chest pain.  Gastrointestinal: Positive for vomiting and abdominal pain.  Musculoskeletal: Positive for back pain and myalgias.    Physical Exam H. EENT exam is normal. Chest there are bilateral rhonchi with prolonged expiratory phase  Heart regular rate and rhythm without murmurs Abdomen soft nontender Extremities there is no calf pain ortenderness  [facility]  reading (PRIMARY) by  Dr. Cleta Alberts appears to be a patchy infiltrate in the right lower lobe rule out focal area of pneumonia Results for orders placed in visit on 10/23/13  POCT CBC      Result Value Ref Range   WBC 9.6  4.6 - 10.2 K/uL   Lymph, poc 3.0  0.6 - 3.4   POC LYMPH PERCENT 31.1  10 - 50 %L   MID (cbc) 0.5  0 - 0.9   POC MID % 4.9  0 - 12 %M   POC Granulocyte 6.1  2 - 6.9   Granulocyte percent 64.0  37 - 80 %G   RBC 5.09  4.04 - 5.48 M/uL   Hemoglobin 13.9  12.2 - 16.2 g/dL   HCT, POC 95.6  21.3 - 47.9 %   MCV 85.3  80 - 97 fL   MCH, POC 27.3  27 - 31.2 pg   MCHC 32.0  31.8 - 35.4 g/dL   RDW, POC 08.6     Platelet Count, POC 260  142 - 424 K/uL   MPV 9.6  0 - 99.8 fL  GLUCOSE, POCT (MANUAL RESULT ENTRY)      Result Value Ref Range   POC Glucose 88  70 - 99 mg/dl  .  Filed Vitals:   10/23/13 1702  BP: 130/82  Pulse: 110  Temp: 98.6 F (37 C)  Resp: 17   Assessment patient has a focal patch of pneumonia in the right base along with reactive airways disease. She did improve with use of an albuterol inhaler we'll continue the Biaxin she  is on stop the naproxen and I gave her Tessalon Perles to help with the cough.  COORDINATION OF CARE:  5:39 PM Discussed course of care with pt which includes breathing treatment. Pt understands and agrees.    Plan she will continue Biaxin. She will use albuterol HFA inhaler. She was also given Tessalon Perles to help with her cough.

## 2013-10-23 NOTE — Patient Instructions (Signed)
Neumonía en el adulto  (Pneumonia, Adult)  La neumonía es una infección en los pulmones.   CAUSAS  La causa puede ser un virus o una bacteria. Generalmente estas infecciones se producen por la aspiración de partículas infecciosas que ingresan a los pulmones (tracto respiratorio).  SÍNTOMAS  · Tos.  · Fiebre.  · Dolor en el pecho.  · Frecuencia respiratoria aumentada.  · Dificultad respiratoria.  · Producción de mucosidad.  DIAGNÓSTICO   Si presenta los síntomas comunes de la neumonía, normalmente el médico confirmará el diagnóstico con una radiografía de tórax. La radiografía mostrará la anormalidad del pulmón (infiltrados pulmonares) si tiene neumonía. Podrán realizarse otras pruebas de sangre, orina o esputo para encontrar la causa específica de su neumonía. El profesional que lo asiste le realizará pruebas (como gases en sangre o una oximetría de pulso) para verificar el correcto funcionamiento de los pulmones.  TRATAMIENTO  Algunos tipos de neumonía pueden contagiarse a otras personas al toser o estornudar. Es posible que le pidan que utilice una máscara antes y durante el examen. Si la neumonía es causada por una bacteria, puede tratarse con medicamentos antibióticos. Si la neumonía es causada por el virus de la influenza, puede tratarse con medicamentos antivirales. La mayoría de las demás infecciones virales deben seguir su curso. Estas infecciones no responderán a los antibióticos.   PREVENCIÓN  La vacuna antineumocóccica está disponible para prevenir la neumonía bacteriana común. Generalmente se aconseja su aplicación en:  · Personas mayores de 65 años de edad.  · Pacientes que están en tratamiento de quimioterapia.  · Personas con trastornos pulmonares crónicos, como bronquitis o enfisema.  · Personas con problemas del sistema inmunológico.  Si usted es mayor de 65 años de edad o tiene un trastorno que lo pone en situación de alto riesgo, es posible que reciba una vacuna antineumocócica. En algunos países,  también se recomienda la aplicación de rutina de la vacuna contra la influenza. Esta vacuna puede ayudar a prevenir algunos casos de neumonía. Es posible que le ofrezcan aplicarse la vacuna contra la influenza como parte del tratamiento.   Si es fumador, es el momento de abandonar el hábito. Podrá recibir instrucciones para facilitarle la decisión de dejar de fumar. Los profesionales pueden proporcionarle medicamentos y asesoramiento para ayudarlo.  INSTRUCCIONES PARA EL CUIDADO DOMICILIARIO  · Puede utilizar antitusivos si no puede descansar bien; sin embargo, la tos ayuda a despejar los pulmones. Debe evitar utilizar medicamentos para detener la tos, si puede.  · Es posible que el médico le haya prescrito medicamentos si cree que su neumonía es causada por una bacteria o por la influenza. Finalice la prescripción completa, aunque comience a sentirse mejor.  · El profesional también puede recomendarle o prescribirle un expectorante para aflojar la mucosidad y eliminarla con la tos.  · Utilice los medicamentos de venta libre o de prescripción para el dolor, el malestar o la fiebre, según se lo indique el profesional que lo asiste.  · No fume. El fumar es una de las causas más frecuentes de bronquitis y puede contribuir a la neumonía. Si es fumador y continúa fumando, la tos puede durar varias semanas después que la neumonía haya desaparecido.  · Un vaporizador o humidificador con vapor frío en la habitación o en la casa pueden ayudar a aflojar la mucosidad.  · La tos generalmente empeora por la noche. Duerma en posición semisentada en una reposera o use un par de almohadas debajo de la cabeza.  · Haga reposo todo el tiempo   que necesite. El organismo le hará saber cuando tiene que descansar.  SOLICITE ATENCIÓN MÉDICA DE INMEDIATO SI:  · La enfermedad empeora. Esto vale especialmente en el caso de que usted sea una persona mayor o se encuentre débil por otra enfermedad.  · No puede controlar la tos con medicamentos y  no puede dormir debido a ello.  · Comienza a escupir sangre al toser.  · Presenta dolor que empeora o no puede controlar con los medicamentos.  · Tiene fiebre.  · Si alguno de los síntomas que lo trajeron a la consulta empeoran en vez de mejorar.  · Siente falta de aire o dolor en el pecho.  ESTÉ SEGURO QUE:   · Comprende las instrucciones para el alta médica.  · Controlará su enfermedad.  · Solicitará atención médica de inmediato según las indicaciones.  Document Released: 04/24/2005 Document Revised: 10/07/2011  [employer]® Patient Information ©2014 [employer].

## 2014-03-12 ENCOUNTER — Other Ambulatory Visit: Payer: Self-pay | Admitting: Family Medicine

## 2014-03-15 ENCOUNTER — Ambulatory Visit (INDEPENDENT_AMBULATORY_CARE_PROVIDER_SITE_OTHER): Payer: No Typology Code available for payment source | Admitting: Family Medicine

## 2014-03-15 VITALS — BP 114/68 | HR 79 | Temp 97.5°F | Resp 16 | Ht 62.0 in | Wt 168.0 lb

## 2014-03-15 DIAGNOSIS — R252 Cramp and spasm: Secondary | ICD-10-CM

## 2014-03-15 DIAGNOSIS — Z23 Encounter for immunization: Secondary | ICD-10-CM

## 2014-03-15 DIAGNOSIS — R3589 Other polyuria: Secondary | ICD-10-CM

## 2014-03-15 DIAGNOSIS — N393 Stress incontinence (female) (male): Secondary | ICD-10-CM

## 2014-03-15 DIAGNOSIS — Z3009 Encounter for other general counseling and advice on contraception: Secondary | ICD-10-CM

## 2014-03-15 DIAGNOSIS — R358 Other polyuria: Secondary | ICD-10-CM

## 2014-03-15 DIAGNOSIS — M62838 Other muscle spasm: Secondary | ICD-10-CM

## 2014-03-15 DIAGNOSIS — K219 Gastro-esophageal reflux disease without esophagitis: Secondary | ICD-10-CM

## 2014-03-15 DIAGNOSIS — Z0000 Encounter for general adult medical examination without abnormal findings: Secondary | ICD-10-CM

## 2014-03-15 LAB — COMPREHENSIVE METABOLIC PANEL WITH GFR
ALT: 23 U/L (ref 0–35)
AST: 19 U/L (ref 0–37)
Albumin: 4.1 g/dL (ref 3.5–5.2)
Alkaline Phosphatase: 68 U/L (ref 39–117)
BUN: 12 mg/dL (ref 6–23)
CO2: 26 meq/L (ref 19–32)
Calcium: 8.8 mg/dL (ref 8.4–10.5)
Chloride: 103 meq/L (ref 96–112)
Creat: 0.64 mg/dL (ref 0.50–1.10)
Glucose, Bld: 93 mg/dL (ref 70–99)
Potassium: 4.2 meq/L (ref 3.5–5.3)
Sodium: 137 meq/L (ref 135–145)
Total Bilirubin: 0.4 mg/dL (ref 0.2–1.2)
Total Protein: 6.9 g/dL (ref 6.0–8.3)

## 2014-03-15 LAB — CK: Total CK: 49 U/L (ref 7–177)

## 2014-03-15 LAB — POCT URINALYSIS DIPSTICK
Bilirubin, UA: NEGATIVE
Blood, UA: NEGATIVE
Glucose, UA: NEGATIVE
Ketones, UA: NEGATIVE
NITRITE UA: NEGATIVE
PROTEIN UA: NEGATIVE
SPEC GRAV UA: 1.025
UROBILINOGEN UA: 0.2
pH, UA: 5.5

## 2014-03-15 LAB — POCT UA - MICROSCOPIC ONLY
BACTERIA, U MICROSCOPIC: NEGATIVE
CASTS, UR, LPF, POC: NEGATIVE
CRYSTALS, UR, HPF, POC: NEGATIVE
Epithelial cells, urine per micros: NEGATIVE
Mucus, UA: NEGATIVE
WBC, Ur, HPF, POC: NEGATIVE
YEAST UA: NEGATIVE

## 2014-03-15 LAB — POCT CBC

## 2014-03-15 LAB — GLUCOSE, POCT (MANUAL RESULT ENTRY): POC Glucose: 93 mg/dl (ref 70–99)

## 2014-03-15 LAB — POCT URINE PREGNANCY: PREG TEST UR: NEGATIVE

## 2014-03-15 LAB — POCT GLYCOSYLATED HEMOGLOBIN (HGB A1C): HEMOGLOBIN A1C: 5.8

## 2014-03-15 LAB — POCT SEDIMENTATION RATE: POCT SED RATE: 10 mm/h (ref 0–22)

## 2014-03-15 LAB — C-REACTIVE PROTEIN: CRP: <0.5 mg/dL

## 2014-03-15 LAB — MAGNESIUM: MAGNESIUM: 2 mg/dL (ref 1.5–2.5)

## 2014-03-15 MED ORDER — RANITIDINE HCL 150 MG PO TABS: 150.0000 mg | ORAL_TABLET | Freq: Two times a day (BID) | ORAL | Status: DC

## 2014-03-15 MED ORDER — MELOXICAM 15 MG PO TABS: 15.0000 mg | ORAL_TABLET | Freq: Every day | ORAL | Status: DC

## 2014-03-15 MED ORDER — CYCLOBENZAPRINE HCL 10 MG PO TABS: 10.0000 mg | ORAL_TABLET | Freq: Every day | ORAL | Status: DC

## 2014-03-15 NOTE — Patient Instructions (Addendum)
Call breast center to schedule your mammogram. We will do your pap smear next summer - 12/2014  Ejercicios de Kegel  (Kegel Exercises) El objetivo de los ejercicios de Kegel es aislar y Company secretary los msculos del suelo plvico. Estos msculos actan como una hamaca que soporta el recto, la vagina, el intestino delgado y [place]. A medida que los msculos se debilitan, se hunden y esos rganos son desplazados de sus posiciones normales. Los ejercicios de Kegel fortalecen los msculos del suelo plvico y ayudan a Careers information officer control de la vejiga y del intestino, mejoran la respuesta sexual y Biomedical scientist a Lawyer problemas y Chartered certified accountant durante el [place]. Se pueden hacer en cualquier lugar y en cualquier momento.  CMO REALIZAR LOS EJERCICIOS DE KEGEL  1. Localice los msculos del suelo plvico. Para ello apriete (contraiga) los msculos que se utilizan para tratar de TEFL teacher el flujo de [place]. Usted se sentir una opresin en el rea vaginal (mujeres) y que se eleva y comprime la zona rectal (hombres y mujeres). 2. Para comenzar, contraiga los msculos plvicos durante 2 a 5 segundos y despus reljelos durante 2 a 5 segundos. Esta es una serie. Repita 4 a 5 series con una breve pausa en el medio. 3. Contraiga los msculos de la pelvis durante 8 a 10 segundos y despus reljelos durante 8 a 10 segundos. Repita 4 a 5 series. Si no puede contraer los msculos de la pelvis durante 8 a 10 segundos, trate de 5 a 7 segundos y Scientist, research (physical sciences) 8 a 10 segundos. Su objetivo es Education officer, environmental 4 a 5 series de 10 Advice worker. Mantenga el estmago, las nalgas y las piernas relajadas durante los ejercicios. Realice ambas series de contracciones, cortas y largas. Vare las posiciones. Haga las contracciones 3 a 4 veces por Futures trader. Haga las series mientras est:    Acostado en la cama por la [place].  De pie en el almuerzo.  Sentado por las tardes.  Acostado en la cama por la noche.  Debe hacer  40 a 50 contracciones por da. No realice ms ejercicios de Kegel por da que lo recomendado. El exceso de ejercicio puede causar fatiga muscular. Contine con estos ejercicios durante al menos 15 a 20 semanas o segn las indicaciones de su mdico.  Document Released: 07/01/2012 [facility] Patient Information 2015 [place], [place]. This information is not intended to replace advice given to you by your health care provider. Make sure you discuss any questions you have with your health care provider. Contenido de potasio de los alimentos (Potassium Content of Foods) El potasio es un mineral que se encuentra en muchos alimentos y bebidas. Ayuda a Radio producer equilibrio de lquidos y [employer] organismo, e influye en la regularidad con la que el corazn late. El potasio tambin ayuda a Chief Operating Officer la presin arterial y a Programme researcher, broadcasting/film/video y [place]. Algunos medicamentos y afecciones pueden cambiar el equilibrio de potasio en el cuerpo. Cuando esto sucede, puede ayudar a Boeing de potasio a travs de los alimentos que consume. El mdico o el nutricionista le recomendarn la cantidad de potasio que debe ingerir por Futures trader. Las siguientes listas proporcionan la cantidad de potasio (entre parntesis) por porcin en cada alimento. CON ALTO CONTENIDO DE POTASIO  Los siguientes alimentos y bebidas tienen 200 mg o ms de potasio por porcin:  Damascos, 2 crudos o 5 secos (200 mg)  Alc[place]ofa, 1 mediana (345 mg)  Aguacate, crudo, 1/4 (245 mg)  Banana, 1 mediana (425 mg)  Frijoles, lima o frijoles en salsa de tomates, enlatados, 1/2 taza (280 mg)  Frijoles blancos, enlatados, 1/2 taza (595 mg)  Carne asada, 3 onzas/85 g (320 mg)  Carne molida, 3 onzas/85 g (270 mg)  Remolachas, crudas o cocidas, 1/2 taza (260 mg)  Bollitos de salvado, 2 onzas/57 g (300 mg)  Brcoli, 1/2 taza (230 mg)  Repollitos de Bruselas, 1/2 taza (250 mg)  Meln, 1/2 taza (215  mg)  Cereales, 100 % de salvado, 1/2 taza (200 a 400 mg)  [place] de queso, sola, comida rpida, 1 (225 a 400 mg)  Pollo, 3 onzas/85 g (220 mg)  Almejas, enlatadas, 3 onzas/85 g (535 mg)  Cangrejo, 3 onzas/85 g (225 mg)  Dtiles, 5 (270 mg)  Frijoles y guisantes secos, 1/2 taza (300 a 475 mg)  Higos, secos, 2 (260 mg)  Pescado: halibut, atn, bacalao, pargo, 3 onzas/85 g (480 mg)  Pescado: salmn, abadejo, pez espada, perca, 3 onzas/85 g (300 mg)  Pescado: atn, enlatado, 3 onzas/85 g (200 mg)  Papas fritas, comida rpida, 3 onzas/85 g (470 mg)  Granola con frutas y frutos secos, 1/2 taza (200 mg)  Jugo de pomelo, 1/2 taza (200 mg)  Hojas verdes de Psychiatric nurse, 1/2 taza (655 mg)  Meln dulce, 1/2 taza (200 mg)  Col rizada, 1 taza (300 mg)  Kiwi, 1 mediano (240 mg)  Colirrbano, colinabo, chiriva, 1/2 taza (280 mg)  Lentejas, 1/2 taza (365 mg)  Mango, 1 (325 mg)  Leche con chocolate, 1 taza (420 mg)  Leche: descremada, parcialmente descremada, entera, suero de [place], 1 taza (350 a 380 mg)  Melaza, 1 cucharada (295 mg)  Championes, 1/2 taza (280 mg)  Nectarina, 1 (275 mg)  Frutos secos: almendras, manes, avellanas, nueces de [place], castaas de caj, mezclados, 1 onza/28 g (200 mg)  Frutos secos: pistachos, 1 onza/28 g (295 mg)  Naranja, 1 (240 mg)  Jugo de naranja, 1/2 taza (235 mg)  Papaya, mediana, 1/2 fruta (390 mg)  Mantequilla de man, con trozos, 2 cucharadas (240 mg)  Mantequilla de man, sin trozos, 2 cucharadas (210 mg)  Pera, 1 mediana (200 mg)  Granada, 1 entera (400 mg)  Jugo de granada, 1/2 taza (215 mg)  Cerdo, 3 onzas/85 g (350 mg)  Papas fritas (de bolsa), 1 onza/85 g (465 mg)  Papa, asada, con piel, 1 mediana (925 mg)  Papas, hervidas, 1/2 taza (255 mg)  Papas, pur, 1/2 taza (330 mg)  Jugo de ciruela, 1/2 taza (370 mg)  Ciruelas, 5 (305 mg)  Pudin de chocolate, 1/2 taza (230 mg)  [place]aza, enlatada, 1/2  taza (250 mg)  Pasas de uva, sin semilla, 1/4 taza (270 mg)  Semillas de girasol o calabaza, 1 onza/28 g (240 mg)  Leche de soja, 1 taza (300 mg)  Espinaca, 1/2 taza (420 mg)  Espinaca, enlatada, 1/2 taza (370 mg)  Batata, asada, con piel, 1 mediana (450 mg)  Acelga suiza, 1/2 taza (480 mg)  Jugo de tomate o verduras, 1/2 taza (275 mg)  Salsa o pur de tomates, 1/2 taza (400 a 550 mg)  Tomate, crudo, 1 mediano (290 mg)  Tomates, enlatados, 1/2 taza (200 a 300 mg)  Pavo, 3 onzas/85 g (250 mg)  Germen de trigo, 1 onza/28 g (250 mg)  [place]aza de invierno, 1/2 taza (250 mg)  Yogur, comn o con frutas, 6 onzas/177 ml (260 a 435 mg)  Calabacn, 1/2 taza (220 mg) CON CONTENIDO MODERADO DE POTASIO Los  siguientes alimentos y bebidas tienen entre 50 y 200 mg de potasio por porcin:  Manzana, 1 (150 mg)  Jugo de [place], 1/2 taza (150 mg)  Pur de Psychologist, educational, 1/2 taza (90 mg)  Nctar de damasco, 1/2 taza (140 mg)  Esprragos, pequeos, 1/2 taza o 6 esprragos (155 mg)  Bagel con canela y pasas de uva, 1 (130 mg)  Bagel con huevo o comn, 4 pulgada/10 cm, 1 (70 mg)  Frijoles verdes, 1/2 taza (90 mg)  Frijoles amarillos, 1/2 taza (190 mg)  Cerveza, regular, 12 onzas/355 ml (100 mg)  Remolachas, enlatadas, 1/2 taza (125 mg)  Moras, 1/2 taza (115 mg)  Arndanos, 1/2 taza (60 mg)  Pan integral, 1 rebanada (70 mg)  Brcoli, crudo, 1/2 taza (145 mg)  Repollo, 1/2 taza (150 mg)  Zanahorias, crudas o cocidas, 1/2 taza (180 mg)  Coliflor, cruda, 1/2 taza (150 mg)  Apio, crudo, 1/2 taza (155 mg)  Cereales, copos de salvado, 1/2 taza (120 a 150 mg)  Requesn, 1/2 taza (110 mg)  Cerezas, 10 (150 mg)  Chocolate, barra de 1 1/2 onza/43 g (165 mg)  Caf, de cafetera, 6 onzas/177 ml (90 mg)  Maz, 1/2 taza o 1 espiga (195 mg)  Pepinos, 1/2 taza (80 mg)  Huevo, grande, 1 (60 mg)  Berenjena, 1/2 taza (60 mg)  Endivia, cruda, 1/2 taza (80 mg)  Muffin  ingls, 1 (65 mg)    Cctel de frutas, 1/2 taza (115 mg)  Jugo de uvas, 1/2 taza (170 mg)  Pomelo, 1/2 (175 mg)  Uvas, 1/2 taza (155 mg)  Hojas verdes: col rizada, nabo, col, 1/2 taza (110 a 150 mg)  Helado o yogur helado, de chocolate, 1/2 taza (175 mg)  Helado o yogur helado, de vainilla, 1/2 taza (120 a 150 mg)  Limones, limas, 1 (80 mg)  Lechuga, todos los tipos, 1 taza (100 mg)  Terex [employer], 1/2 taza (150 mg)  Championes, crudos, 1/2 taza (110 mg)  Frutos secos: nueces, pacanas o macadamia, 1 onza/28 g (125mg )  Avena, 1/2 taza (80 mg)  Qu[place]omb, 1/2 taza (110 mg)  Cebolla, cruda, 1/2 taza (120 mg)  Durazno, 1 (185 mg)  Duraznos, enlatados, 1/2 taza (120 mg)  Peras, enlatadas, 1/2 taza (120 mg)  Guisantes, congelados, 1/2 taza (90 mg)  Pimientos, verdes, 1/2 taza (130 mg)  Pimientos, rojos, 1/2 taza (160 mg)  Jugo de pia, 1/2 taza (165 mg)  Pia, fresca o enlatada, 1/2 taza (100 mg)  Ciruelas, 1 (105 mg)  Pudin de vainilla, 1/2 taza (150 mg)  Frambuesas, 1/2 taza (90 mg)  Ruibardo, 1/2 taza (115 mg)  Arroz salvaje, 1/2 taza (80 mg)  Camarones, 3 onzas/85 g (155 mg)  Espinaca, cruda, 1 taza (170 mg)  Fresas, 1/2 taza (125 mg)  Calabaza de verano, 1/2 taza (175 a 200 mg)  Acelga suiza, cruda, 1 taza (135 mg)  Mandarina, 1 (140 mg)  T, 6 onzas/177 ml (65 mg)  Nabos, 1/2 taza (140 mg)  Sanda, 1/2 taza (85 mg)  Vino tinto, de mesa, 5 onzas/148 ml (180 mg)  Vino blanco, de mesa, 5 onzas/148 ml (100 mg) CON BAJO CONTENIDO DE POTASIO Los siguientes alimentos y bebidas tienen menos de 50 mg de potasio por porcin:  Pan blanco, 1 rebanada (30 g)  Bebidas gaseosas, 12 onzas/355 ml (menos de 5 mg)  Queso, 1 onza/28 g (20 a 30 mg)  Arndanos rojos, 1/2 taza (45 mg)  Cctel con jugo de arndanos rojos, 1/2 taza (20  mg)  Grasas y aceites, 1 cucharada (menos de 5 mg)  Hummus, 1 cucharada (32 mg)  Nctar: papaya, mango  o pera, 1/2 taza (35 mg)  Arroz blanco o integral, 1/2 taza (50 mg)  Espaguetis o macarrones, cocidos, 1/2 taza (30 mg)  Tortilla de harina o maz, 1 (50 mg)  Waffle, 4 pulgadas/10 cm, 1 (50 mg)  Castaas de agua, 1/2 taza (40 mg) Document Released: 04/08/2012 Document Revised: 07/20/2013 [facility] Patient Information 2015 [place], [place]. This information is not intended to replace advice given to you by your health care provider. Make sure you discuss any questions you have with your health care provider.  Keeping You Healthy  Get These Tests 1. Blood Pressure- Have your blood pressure checked once a year by your health care provider.  Normal blood pressure is 120/80. 2. Weight- Have your body mass index (BMI) calculated to screen for obesity.  BMI is measure of body fat based on height and weight.  You can also calculate your own BMI at https://www.west-esparza.com/. 3. Cholesterol- Have your cholesterol checked every 5 years starting at age 57 then yearly starting at age 7. 4. Chlamydia, HIV, and other sexually transmitted diseases- Get screened every year until age 31, then within three months of each new sexual provider. 5. Pap Smear- Every 1-3 years; discuss with your health care provider. 6. Mammogram- Every year starting at age 37  Take these medicines  Calcium with Vitamin D-Your body needs 1200 mg of Calcium each day and [phone] IU of Vitamin D daily.  Your body can only absorb 500 mg of Calcium at a time so Calcium must be taken in 2 or 3 divided doses throughout the day.  Multivitamin with folic acid- Once daily if it is possible for you to become pregnant.  Get these Immunizations  Gardasil-Series of three doses; prevents HPV related illness such as genital warts and cervical cancer.  Menactra-Single dose; prevents meningitis.  Tetanus shot- Every 10 years.  Flu shot-Every year.  Take these steps 1. Do not smoke-Your healthcare provider can help you quit.  For tips  on how to quit go to www.smokefree.gov or call 1-800 QUITNOW. 2. Be physically active- Exercise 5 days a week for at least 30 minutes.  If you are not already physically active, start slow and gradually work up to 30 minutes of moderate physical activity.  Examples of moderate activity include walking briskly, dancing, swimming, bicycling, etc. 3. Breast Cancer- A self breast exam every month is important for early detection of breast cancer.  For more information and instruction on self breast exams, ask your healthcare provider or [email or web address]. 4. Eat a healthy diet- Eat a variety of healthy foods such as fruits, vegetables, whole grains, low fat milk, low fat cheeses, yogurt, lean meats, poultry and fish, beans, nuts, tofu, etc.  For more information go to www. Thenutritionsource.org 5. Drink alcohol in moderation- Limit alcohol intake to one drink or less per day. Never drink and drive. 6. Depression- Your emotional health is as important as your physical health.  If you're feeling down or losing interest in things you normally enjoy please talk to your healthcare provider about being screened for depression. 7. Dental visit- Brush and floss your teeth twice daily; visit your dentist twice a year. 8. Eye doctor- Get an eye exam at least every 2 years. 9. Helmet use- Always wear a helmet when riding a bicycle, motorcycle, rollerblading or skateboarding. 10. Safe sex- If you may be exposed to sexually  transmitted infections, use a condom. 11. Seat belts- Seat belts can save your live; always wear one. 12. Smoke/Carbon Monoxide detectors- These detectors need to be installed on the appropriate level of your home. Replace batteries at least once a year. 13. Skin cancer- When out in the sun please cover up and use sunscreen 15 SPF or higher. 14. Violence- If anyone is threatening or hurting you, please tell your healthcare provider.  Distensin Torcica Teacher, English as a foreign language(Thoracic  Strain) Usted se ha daado los msculos o tendones que estn unidos a la parte superior de la espalda, por detrs del pecho. Esta lesin se denomina distensin torcica, esguince torcico o distensin de la espalda media.  CAUSAS Puede tener diferentes causas. Una lesin menos grave se produce cuando un msculo se distiende pero no se rompe. Una lesin ms grave es la(ruptura) de un msculo o un tendn. En lesiones menos graves podr existir alguna prdida de fuerza. A veces puede haber fracturas de los huesos a los cuales estn Boston Scientificunidos los msculos. Estas fracturas son raras, excepto cuando hubo un golpe directo (traumatismo) o los huesos estn dbiles debido a la osteoporosis o a la edad. La causa de los esguinces crnicos puede ser el uso excesivo o inadecuado durante ciertos movimientos. La obesidad tambin aumenta el riesgo de lesiones en la espalda. Los esguinces agudos pueden producirse por una lesin o por falta de precalentamiento adecuado antes de hacer ejercicios. En general, no hay causas evidentes para el esguince torcico. SNTOMAS El sntoma principal es el dolor, especialmente con los movimientos, por ejemplo cuando se hacen ejercicios.  DIAGNSTICO Normalmente puede diagnosticarse con rayos-x y un examen fsico. TRATAMIENTO  La fisioterapia puede ser til para la recuperacin. El Firefightermdico le indicar ejercicios o lo derivar a un fisioterapeuta despus de que el dolor mejore.  Cuando el dolor se calme, deber realizar un programa de fortalecimiento y rehabilitacin adecuado para el tipo de deporte u ocupacin que Biomedical engineerrealiza.  Entre en calor antes de realizar deporte o actividad fsica. Tambin puede ser beneficioso que elongue despus de la actividad fsica.  Puede tomar medicamentos de H. J. Heinzventa libre. Consulte a su mdico qu medicamentos puede tomar. Si esta es la primera vez que le sucede esta lesin, un cuidado adecuado y un tiempo de curacin suficiente antes de Games developercomenzar la actividad  ayudar a prevenir una incapacidad de Air cabin crewlargo plazo. Los desgarros de ligamentos y tendones requieren tanto tiempo para curarse The First Americancomo los huesos rotos. El tiempos promedio de curacin puede ser de slo una semana para un esguince medio. Para desgarros de msculos y tendones, el tiempo promedio de curacin puede ser desde 6 semanas a 2 meses. INSTRUCCIONES PARA EL CUIDADO DOMICILIARIO  Aplique hielo sobre la zona lesionada. Puede hacerse masajes con hielo segn las indicaciones.  Ponga el hielo en una bolsa plstica.  Coloque una toalla entre la piel y la bolsa de hielo.  Deje la bolsa de hielo durante 15 a 20 minutos 3 a 4 veces por da, durante los 2 Entergy Corporationprimeros das.  Utilice los medicamentos de venta libre o de prescripcin para Chief Technology Officerel dolor, Environmental health practitionerel malestar o la [place] Bloom[place] Grovefiebre, segn se lo indique el profesional que lo asiste.  Concurra a las consultas de fisioterapia si se le han prescrito.  Use vendas y aparatos ortopdicos para la espalda segn las instrucciones. SOLICITE ATENCIN MDICA DE INMEDIATO SI:  Aumenta el hematoma, la hinchazn o el dolor.  No consigue Engineer, materialsaliviar el dolor con la medicacin.  Comienza a Financial risk analystsentir que le falta el aire, Electronics engineersiente dolor en  el pecho o le sube la fiebre.  El problema parece empeorar ms que mejorar. ASEGRESE DE QUE:   Comprende estas instrucciones.  Controlar su enfermedad.  Solicitar ayuda de inmediato si no mejora o empeora. Document Released: 10/22/2007 Document Revised: 10/07/2011 [facility] Patient Information 2015 [place], [place]. This information is not intended to replace advice given to you by your health care provider. Make sure you discuss any questions you have with your health care provider.

## 2014-03-15 NOTE — Progress Notes (Signed)
Subjective:    Patient ID: Cheryl Patrick, female    DOB: 07/08/1971, 43 y.o.   MRN: [id number] Chief Complaint  Patient presents with  . Annual Exam    HPI  Right shoulder and back pain for the past 3 mos - worsening - feels inflammed - worse when laying down on right side so now sleeping on her left.  Even when she lifts or rotates her arm gets worse. Is right-handed. Works on [employer].  Worse at night but now hurting during the day as well.  Taking advil w/ some relief but no injuries. No numbness, tingling, or weakness in right hand.   Also having cramping in Rt calf.  At night also for about 6 wks.  2 bottles of water during the day. She has gastritis sxs and so can't eat banana s Does have some urinary incontence - mainly stress, has had kids by vaginal delivery. Also worried about birth control and had condom broke 8/15.  Was not taking OCPs - just didn't have any refills.  LMP was 8/05. Would like to get Depo-Provera injection - stopped prev as didn't able to get in regularly but now she thinks she can.  OCP caused HAs. All prev paps normal.  Past Medical History  Diagnosis Date  . Anxiety   . Hyperlipemia    History reviewed. No pertinent past surgical history. No current outpatient prescriptions on file prior to visit.   No current facility-administered medications on file prior to visit.   No Known Allergies Family History  Problem Relation Age of Onset  . Lymphoma Mother   . Hypertension Mother   . Diabetes Father    History   Social History  . Marital Status: Married    Spouse Name: N/A    Number of Children: 3  . Years of Education: N/A   Occupational History  . Shoe sales   .     Social History Main Topics  . Smoking status: Never Smoker   . Smokeless tobacco: Never Used  . Alcohol Use: No  . Drug Use: No  . Sexual Activity: No   Other Topics Concern  . None   Social History Narrative  . None      Review of Systems    Constitutional: Positive for fatigue. Negative for fever, chills, diaphoresis and appetite change.  Eyes: Negative for visual disturbance.  Respiratory: Negative for cough and shortness of breath.   Cardiovascular: Negative for chest pain, palpitations and leg swelling.  Endocrine: Negative for polydipsia and polyuria.  Genitourinary: Positive for urgency and enuresis. Negative for decreased urine volume and menstrual problem.  Musculoskeletal: Positive for arthralgias, back pain and myalgias.  Neurological: Negative for syncope, weakness, numbness and headaches.  Hematological: Does not bruise/bleed easily.  Psychiatric/Behavioral: Positive for sleep disturbance.       Objective:  BP 114/68  Pulse 79  Temp(Src) 97.5 F (36.4 C) (Oral)  Resp 16  Ht 5\' 2"  (1.575 m)  Wt 168 lb (76.204 kg)  BMI 30.72 kg/m2  SpO2 99%  Physical Exam  Constitutional: She is oriented to person, place, and time. She appears well-developed and well-nourished. No distress.  HENT:  Head: Normocephalic and atraumatic.  Right Ear: Tympanic membrane, external ear and ear canal normal.  Left Ear: Tympanic membrane, external ear and ear canal normal.  Nose: Nose normal. No mucosal edema or rhinorrhea.  Mouth/Throat: Uvula is midline, oropharynx is clear and moist and mucous membranes are normal. No posterior oropharyngeal  erythema.  Eyes: Conjunctivae and EOM are normal. Pupils are equal, round, and reactive to light. Right eye exhibits no discharge. Left eye exhibits no discharge. No scleral icterus.  Neck: Normal range of motion. Neck supple. No thyromegaly present.  Cardiovascular: Normal rate, regular rhythm, normal heart sounds and intact distal pulses.   Pulmonary/Chest: Effort normal and breath sounds normal. No respiratory distress.  Abdominal: Soft. Bowel sounds are normal. There is no tenderness.  Genitourinary: No breast swelling, tenderness, discharge or bleeding.  Musculoskeletal: She exhibits no  edema.  Lymphadenopathy:    She has no cervical adenopathy.  Neurological: She is alert and oriented to person, place, and time. She has normal reflexes.  Skin: Skin is warm and dry. She is not diaphoretic. No erythema.  Psychiatric: She has a normal mood and affect. Her behavior is normal.          Assessment & Plan:   Routine general medical examination at a health care facility - pt reminded to schedule her mammogram with the breast center.  Will do pap smear in 1 yr at her next CPE - is due 6/16.  Muscle cramps at night - Plan: POCT CBC, POCT SEDIMENTATION RATE, Comprehensive metabolic panel, CK, Magnesium, C-reactive protein, TSH  Muscle spasms of neck - Plan: POCT SEDIMENTATION RATE, Comprehensive metabolic panel, CK, Magnesium, C-reactive protein, TSH  Muscle spasm of right shoulder - Plan: POCT SEDIMENTATION RATE, Comprehensive metabolic panel, CK, Magnesium, C-reactive protein  Polyuria - Plan: POCT UA - Microscopic Only, POCT urinalysis dipstick, POCT glucose (manual entry), POCT glycosylated hemoglobin (Hb A1C), Urine culture, Comprehensive metabolic panel  Stress incontinence - Plan: POCT UA - Microscopic Only, POCT urinalysis dipstick, Urine culture, Comprehensive metabolic panel, TSH  Gastroesophageal reflux disease, esophagitis presence not specified - Plan: Comprehensive metabolic panel, ranitidine (ZANTAC) 150 MG tablet  Family planning - Plan: POCT urine pregnancy, hCG, serum, qualitative  Need for prophylactic vaccination with combined diphtheria-tetanus-pertussis (DTP) vaccine - Plan: Tdap vaccine greater than or equal to 7yo IM  Meds ordered this encounter  Medications  . ranitidine (ZANTAC) 150 MG tablet    Sig: Take 1 tablet (150 mg total) by mouth 2 (two) times daily.    Dispense:  60 tablet    Refill:  11  . cyclobenzaprine (FLEXERIL) 10 MG tablet    Sig: Take 1 tablet (10 mg total) by mouth at bedtime.    Dispense:  30 tablet    Refill:  0  .  meloxicam (MOBIC) 15 MG tablet    Sig: Take 1 tablet (15 mg total) by mouth daily. For shoulder/back pain    Dispense:  30 tablet    Refill:  1    Norberto SorensonEva Janea Schwenn, MD MPH  Results for orders placed in visit on 03/15/14  URINE CULTURE      Result Value Ref Range   Colony Count 6,000 COLONIES/ML     Organism ID, Bacteria Insignificant Growth    HCG, SERUM, QUALITATIVE      Result Value Ref Range   Preg, Serum NEG    COMPREHENSIVE METABOLIC PANEL      Result Value Ref Range   Sodium 137  135 - 145 mEq/L   Potassium 4.2  3.5 - 5.3 mEq/L   Chloride 103  96 - 112 mEq/L   CO2 26  19 - 32 mEq/L   Glucose, Bld 93  70 - 99 mg/dL   BUN 12  6 - 23 mg/dL   Creat 1.470.64  8.290.50 - 5.621.10  mg/dL   Total Bilirubin 0.4  0.2 - 1.2 mg/dL   Alkaline Phosphatase 68  39 - 117 U/L   AST 19  0 - 37 U/L   ALT 23  0 - 35 U/L   Total Protein 6.9  6.0 - 8.3 g/dL   Albumin 4.1  3.5 - 5.2 g/dL   Calcium 8.8  8.4 - 81.1 mg/dL  CK      Result Value Ref Range   Total CK 49  7 - 177 U/L  MAGNESIUM      Result Value Ref Range   Magnesium 2.0  1.5 - 2.5 mg/dL  C-REACTIVE PROTEIN      Result Value Ref Range   CRP <0.5  <0.60 mg/dL  TSH      Result Value Ref Range   TSH 1.829  0.350 - 4.500 uIU/mL  POCT UA - MICROSCOPIC ONLY      Result Value Ref Range   WBC, Ur, HPF, POC neg     RBC, urine, microscopic 0-2     Bacteria, U Microscopic neg     Mucus, UA neg     Epithelial cells, urine per micros neg     Crystals, Ur, HPF, POC neg     Casts, Ur, LPF, POC neg     Yeast, UA neg    POCT URINALYSIS DIPSTICK      Result Value Ref Range   Color, UA yellow     Clarity, UA clear     Glucose, UA neg     Bilirubin, UA neg     Ketones, UA neg     Spec Grav, UA 1.025     Blood, UA neg     pH, UA 5.5     Protein, UA neg     Urobilinogen, UA 0.2     Nitrite, UA neg     Leukocytes, UA Trace    POCT URINE PREGNANCY      Result Value Ref Range   Preg Test, Ur Negative    GLUCOSE, POCT (MANUAL RESULT ENTRY)       Result Value Ref Range   POC Glucose 93  70 - 99 mg/dl  POCT CBC      Result Value Ref Range   WBC    4.6 - 10.2 K/uL   Lymph, poc    0.6 - 3.4   POC LYMPH PERCENT    10 - 50 %L   MID (cbc)    0 - 0.9   POC MID %    0 - 12 %M   POC Granulocyte    2 - 6.9   Granulocyte percent    37 - 80 %G   RBC    4.04 - 5.48 M/uL   Hemoglobin    12.2 - 16.2 g/dL   HCT, POC    91.4 - 78.2 %   MCV    80 - 97 fL   MCH, POC    27 - 31.2 pg   MCHC    31.8 - 35.4 g/dL   RDW, POC       Platelet Count, POC    142 - 424 K/uL   MPV    0 - 99.8 fL  POCT GLYCOSYLATED HEMOGLOBIN (HGB A1C)      Result Value Ref Range   Hemoglobin A1C 5.8    POCT SEDIMENTATION RATE      Result Value Ref Range   POCT SED RATE 10  0 - 22 mm/hr

## 2014-03-16 LAB — TSH: TSH: 1.829 u[IU]/mL (ref 0.350–4.500)

## 2014-03-16 LAB — HCG, SERUM, QUALITATIVE: PREG SERUM: NEGATIVE

## 2014-03-17 ENCOUNTER — Ambulatory Visit (INDEPENDENT_AMBULATORY_CARE_PROVIDER_SITE_OTHER): Payer: No Typology Code available for payment source | Admitting: Family Medicine

## 2014-03-17 VITALS — BP 100/72 | HR 80 | Temp 97.8°F | Resp 16 | Ht 62.0 in | Wt 168.0 lb

## 2014-03-17 DIAGNOSIS — R7309 Other abnormal glucose: Secondary | ICD-10-CM

## 2014-03-17 DIAGNOSIS — Z309 Encounter for contraceptive management, unspecified: Secondary | ICD-10-CM

## 2014-03-17 DIAGNOSIS — R7303 Prediabetes: Secondary | ICD-10-CM

## 2014-03-17 LAB — URINE CULTURE: Colony Count: 6000

## 2014-03-17 MED ORDER — MEDROXYPROGESTERONE ACETATE 150 MG/ML IM SUSP
150.0000 mg | Freq: Once | INTRAMUSCULAR | Status: AC
  Administered 2014-03-17: 150 mg via INTRAMUSCULAR

## 2014-03-17 MED ORDER — MEDROXYPROGESTERONE ACETATE 150 MG/ML IM SUSP
150.0000 mg | INTRAMUSCULAR | Status: DC
  Administered 2014-06-20: 150 mg via INTRAMUSCULAR

## 2014-03-17 NOTE — Progress Notes (Signed)
   Subjective:    Patient ID: Cheryl Patrick, female    DOB: 02/02/1971, 43 y.o.   MRN: [id number] Chief Complaint  Patient presents with  . Depo Provera    HPI  Pt is doing well, no concerns. Here for her first Depo-Provera shot since serum hcg we obtained at her visit yest was negative.   She is doing well. Already starting to have some relief of her left shoulder and neck pain - took the flexeril last night and actually rested well for once.  Past Medical History  Diagnosis Date  . Anxiety   . Hyperlipemia    Current Outpatient Prescriptions on File Prior to Visit  Medication Sig Dispense Refill  . cyclobenzaprine (FLEXERIL) 10 MG tablet Take 1 tablet (10 mg total) by mouth at bedtime.  30 tablet  0  . meloxicam (MOBIC) 15 MG tablet Take 1 tablet (15 mg total) by mouth daily. For shoulder/back pain  30 tablet  1  . ranitidine (ZANTAC) 150 MG tablet Take 1 tablet (150 mg total) by mouth 2 (two) times daily.  60 tablet  11   No current facility-administered medications on file prior to visit.   No Known Allergies   Review of Systems  Constitutional: Negative for fever, chills, diaphoresis, activity change, appetite change and fatigue.  Genitourinary: Negative for vaginal bleeding and vaginal discharge.  Musculoskeletal: Positive for arthralgias, back pain, myalgias, neck pain and neck stiffness. Negative for gait problem and joint swelling.  Skin: Negative for rash.  Hematological: Negative for adenopathy.  Psychiatric/Behavioral: Negative for sleep disturbance.       Objective:  BP 100/72  Pulse 80  Temp(Src) 97.8 F (36.6 C) (Oral)  Resp 16  Ht 5\' 2"  (1.575 m)  Wt 168 lb (76.204 kg)  BMI 30.72 kg/m2  SpO2 99%  LMP 03/02/2014  Physical Exam  Constitutional: She is oriented to person, place, and time. She appears well-developed and well-nourished. No distress.  HENT:  Head: Normocephalic and atraumatic.  Right Ear: External ear normal.  Eyes: Conjunctivae are  normal. No scleral icterus.  Pulmonary/Chest: Effort normal.  Neurological: She is alert and oriented to person, place, and time.  Skin: Skin is warm and dry. She is not diaphoretic. No erythema.  Psychiatric: She has a normal mood and affect. Her behavior is normal.       Assessment & Plan:   Unspecified contraceptive management - Plan: medroxyPROGESTERone (DEPO-PROVERA) injection 150 mg, medroxyPROGESTERone (DEPO-PROVERA) injection 150 mg - first dose today since neg serum hcg 2d ago. Ok to continue depo-provera x 1 yr - needs pap 6/16.  Discuss importance of ca/vit D supp while on depo at f/u.  Stress incontinence - start kegel exercises.  Left shoulder and neck pain - improving w/ flexeril and meloxicam. Cont with heat and stretching. If no relief in 4-6 wks, f/u for xrays and cons PT referral.  Pre-diabetes - advised low carb diet, decrease sugar, rec loose 5-10 lbs and recheck in 1 yr Meds ordered this encounter  Medications  . medroxyPROGESTERone (DEPO-PROVERA) injection 150 mg    Sig:   . medroxyPROGESTERone (DEPO-PROVERA) injection 150 mg    Sig:     I personally performed the services described in this documentation, which was scribed in my presence. The recorded information has been reviewed and considered, and addended by me as needed.  Norberto SorensonEva Shaw, MD MPH

## 2014-06-20 ENCOUNTER — Ambulatory Visit (INDEPENDENT_AMBULATORY_CARE_PROVIDER_SITE_OTHER): Payer: No Typology Code available for payment source | Admitting: Radiology

## 2014-06-20 DIAGNOSIS — Z309 Encounter for contraceptive management, unspecified: Secondary | ICD-10-CM

## 2014-06-20 DIAGNOSIS — N912 Amenorrhea, unspecified: Secondary | ICD-10-CM

## 2014-06-20 LAB — POCT URINE PREGNANCY: Preg Test, Ur: NEGATIVE

## 2014-08-25 ENCOUNTER — Ambulatory Visit: Payer: No Typology Code available for payment source | Admitting: Obstetrics

## 2014-09-06 ENCOUNTER — Ambulatory Visit (INDEPENDENT_AMBULATORY_CARE_PROVIDER_SITE_OTHER): Payer: 59 | Admitting: Physician Assistant

## 2014-09-06 DIAGNOSIS — Z309 Encounter for contraceptive management, unspecified: Secondary | ICD-10-CM

## 2014-09-06 MED ORDER — MEDROXYPROGESTERONE ACETATE 150 MG/ML IM SUSP
150.0000 mg | Freq: Once | INTRAMUSCULAR | Status: AC
  Administered 2014-09-06: 150 mg via INTRAMUSCULAR

## 2014-09-06 NOTE — Progress Notes (Signed)
She is late for her Depo for 4 days - she has not been sexually active in the last week.  She was given her Depo injection today and the timing for her next injection.  She was instructed to use condoms for 1 week.

## 2015-04-12 ENCOUNTER — Other Ambulatory Visit: Payer: Self-pay | Admitting: Family Medicine

## 2015-04-21 ENCOUNTER — Encounter: Payer: Self-pay | Admitting: *Deleted

## 2015-07-06 ENCOUNTER — Ambulatory Visit (INDEPENDENT_AMBULATORY_CARE_PROVIDER_SITE_OTHER): Payer: 59 | Admitting: Family Medicine

## 2015-07-06 VITALS — BP 112/72 | HR 87 | Temp 98.6°F | Resp 16 | Ht 62.0 in | Wt 168.0 lb

## 2015-07-06 DIAGNOSIS — Z0000 Encounter for general adult medical examination without abnormal findings: Secondary | ICD-10-CM

## 2015-07-06 DIAGNOSIS — K5900 Constipation, unspecified: Secondary | ICD-10-CM | POA: Diagnosis not present

## 2015-07-06 DIAGNOSIS — Z124 Encounter for screening for malignant neoplasm of cervix: Secondary | ICD-10-CM

## 2015-07-06 LAB — POCT URINALYSIS DIP (MANUAL ENTRY)
BILIRUBIN UA: NEGATIVE
Bilirubin, UA: NEGATIVE
Blood, UA: NEGATIVE
GLUCOSE UA: NEGATIVE
LEUKOCYTES UA: NEGATIVE
Nitrite, UA: NEGATIVE
PROTEIN UA: NEGATIVE
Spec Grav, UA: 1.02
Urobilinogen, UA: 0.2
pH, UA: 5

## 2015-07-06 LAB — POC MICROSCOPIC URINALYSIS (UMFC): Mucus: ABSENT

## 2015-07-06 MED ORDER — CYCLOBENZAPRINE HCL 10 MG PO TABS: 10.0000 mg | ORAL_TABLET | Freq: Every day | ORAL | Status: DC

## 2015-07-06 MED ORDER — RANITIDINE HCL 150 MG PO TABS: 150.0000 mg | ORAL_TABLET | Freq: Two times a day (BID) | ORAL | Status: DC

## 2015-07-06 MED ORDER — POLYETHYLENE GLYCOL 3350 17 G PO PACK: 17.0000 g | PACK | Freq: Every day | ORAL | Status: DC

## 2015-07-06 MED ORDER — IBUPROFEN 600 MG PO TABS: 600.0000 mg | ORAL_TABLET | Freq: Three times a day (TID) | ORAL | Status: DC | PRN

## 2015-07-06 NOTE — Patient Instructions (Signed)
Take the Ibuprofen up to every 6 hours for pain.    Use the Flexeril as a muscle relaxer at night.    We will let you know the results of your labs.    Take Miralax once a day for constipation.    It was good to see you today

## 2015-07-06 NOTE — Progress Notes (Signed)
Cheryl Patrick is a 44 y.o. female who presents to Urgent Care today for Right arm pain:  1.  Right arm pain:  Present for about 3 weeks.  Started as minor pain but has gradually worsened.  No incititing triggers or trauma.  Takes ibuprofen with intermittent relief.  Hurts worse at night when moves her neck or lies in bed.  No numbness/tingling/paresthesias.    2.  Constipation:  Present for about 1 month.  Attempts to evacuate bowels several times a day with straining, but only able to go once a day if at all.  No diarrhea.  Has not tried anything for relief.  Occasional abdominal pain when she strains during bowel movement.  No bleeding. No anal itching.  No tenesmus.   .   Preventative health: Patient requesting pap smear today.   PMH reviewed.  Past Medical History  Diagnosis Date  . Anxiety   . Hyperlipemia    History reviewed. No pertinent past surgical history.  Medications reviewed. Current Outpatient Prescriptions  Medication Sig Dispense Refill  . cyclobenzaprine (FLEXERIL) 10 MG tablet Take 1 tablet (10 mg total) by mouth at bedtime. (Patient not taking: Reported on 07/06/2015) 30 tablet 0  . meloxicam (MOBIC) 15 MG tablet Take 1 tablet (15 mg total) by mouth daily. For shoulder/back pain (Patient not taking: Reported on 07/06/2015) 30 tablet 1  . ranitidine (ZANTAC) 150 MG tablet Take 1 tablet (150 mg total) by mouth 2 (two) times daily. PATIENT NEEDS OFFICE VISIT FOR ADDITIONAL REFILLS (Patient not taking: Reported on 07/06/2015) 60 tablet 0   No current facility-administered medications for this visit.    ROS as above otherwise neg.  No chest pain, palpitations, SOB, Fever, Chills, Abd pain, N/V/D.   Physical Exam:  BP 112/72 mmHg  Pulse 87  Temp(Src) 98.6 F (37 C)  Resp 16  Ht 5\' 2"  (1.575 m)  Wt 168 lb (76.204 kg)  BMI 30.72 kg/m2  SpO2 98% Gen:  Alert, cooperative patient who appears stated age in no acute distress.  Vital signs reviewed. HEENT: EOMI,  MMM Pulm:   Clear to auscultation bilaterally with good air movement.  No wheezes or rales noted.   Cardiac:  Regular rate and rhythm without murmur auscultated.   Abd:  Soft/nondistended/nontender.  Good bowel sounds throughout all four quadrants.  No masses noted.  Exts: Non edematous BL  LE, warm and well perfused.  MSK:  TTP along Right shoulder and neck, mild. Palpable spasm noted Right neck. Full range of motion of neck.  Rest of back is nontender and good range of motion. GYN:  External genitalia within normal limits.  Vaginal mucosa pink, moist, normal rugae.  Nonfriable cervix without lesions, no discharge or bleeding noted on speculum exam.  Pap smear performed. Bimanual exam revealed normal, nongravid uterus.  No cervical motion tenderness. No adnexal masses bilaterally.   Neuro: Strength 5/5 BL hands and UE's   Assessment and Plan:  1.  Neck spasm: - contributing to Right shoulder pain - treat with flexeril and Ibuprofen.  - FU PRN   2.  Constipation: - treat with miralax.  - FU if no response.   3.  Preventative health care: -pap smear obtained - Will also put in referral for mammogram based on patient preference.

## 2015-07-07 LAB — BASIC METABOLIC PANEL
BUN: 17 mg/dL (ref 7–25)
CHLORIDE: 103 mmol/L (ref 98–110)
CO2: 23 mmol/L (ref 20–31)
Calcium: 8.5 mg/dL — ABNORMAL LOW (ref 8.6–10.2)
Creat: 0.64 mg/dL (ref 0.50–1.10)
Glucose, Bld: 112 mg/dL — ABNORMAL HIGH (ref 65–99)
Potassium: 3.9 mmol/L (ref 3.5–5.3)
Sodium: 135 mmol/L (ref 135–146)

## 2015-07-07 LAB — CBC
HCT: 40 % (ref 36.0–46.0)
Hemoglobin: 13.2 g/dL (ref 12.0–15.0)
MCH: 27.6 pg (ref 26.0–34.0)
MCHC: 33 g/dL (ref 30.0–36.0)
MCV: 83.7 fL (ref 78.0–100.0)
MPV: 10 fL (ref 8.6–12.4)
PLATELETS: 214 10*3/uL (ref 150–400)
RBC: 4.78 MIL/uL (ref 3.87–5.11)
RDW: 13.8 % (ref 11.5–15.5)
WBC: 9.7 10*3/uL (ref 4.0–10.5)

## 2015-07-10 LAB — PAP IG W/ RFLX HPV ASCU

## 2015-07-11 ENCOUNTER — Other Ambulatory Visit: Payer: Self-pay

## 2015-07-11 DIAGNOSIS — Z1231 Encounter for screening mammogram for malignant neoplasm of breast: Secondary | ICD-10-CM

## 2016-03-28 ENCOUNTER — Ambulatory Visit (INDEPENDENT_AMBULATORY_CARE_PROVIDER_SITE_OTHER): Payer: 59

## 2016-03-28 ENCOUNTER — Ambulatory Visit (INDEPENDENT_AMBULATORY_CARE_PROVIDER_SITE_OTHER): Payer: 59 | Admitting: Physician Assistant

## 2016-03-28 VITALS — BP 120/70 | HR 92 | Temp 98.9°F | Resp 18 | Ht 62.0 in | Wt 169.0 lb

## 2016-03-28 DIAGNOSIS — R1013 Epigastric pain: Secondary | ICD-10-CM

## 2016-03-28 DIAGNOSIS — K5909 Other constipation: Secondary | ICD-10-CM | POA: Diagnosis not present

## 2016-03-28 LAB — POCT URINE PREGNANCY: Preg Test, Ur: NEGATIVE

## 2016-03-28 MED ORDER — RANITIDINE HCL 150 MG PO TABS: 150.0000 mg | ORAL_TABLET | Freq: Two times a day (BID) | ORAL | 0 refills | Status: DC

## 2016-03-28 MED ORDER — POLYETHYLENE GLYCOL 3350 17 GM/SCOOP PO POWD: 17.0000 g | Freq: Two times a day (BID) | ORAL | 1 refills | Status: DC | PRN

## 2016-03-28 NOTE — Progress Notes (Signed)
Cheryl Patrick  MRN: [id number] DOB: 16-Jan-1971  PCP: Georgian Co, PA-C  Subjective:  Pt is a 45 y.o. female presenting to clinic for stomach pain. Her pain started one week ago in her right upper quadrant and has since moved to the left side of her belly. Pain is described as vague, dull, is intermittent throughout the day, radiates to her back. Today one episode of diarrhea and nausea. No blood.   Describes her usual, every day bowel movements as "little". She has tried Tylenol, no relief.  No change in appetite. No vomiting.   History of dyspepsia. Takes Zantac.      Review of Systems  Constitutional: Negative for appetite change, chills, fatigue, fever and unexpected weight change.  Respiratory: Negative.   Cardiovascular: Negative.   Gastrointestinal: Positive for abdominal pain, constipation, diarrhea and nausea. Negative for blood in stool and vomiting.  Genitourinary: Negative for dysuria, flank pain and pelvic pain.    Patient Active Problem List   Diagnosis Date Noted  . DYSPEPSIA 09/20/2009  . ABDOMINAL PAIN OTHER SPECIFIED SITE 09/20/2009  . CHEST PAIN 09/12/2008    Current Outpatient Prescriptions on File Prior to Visit  Medication Sig Dispense Refill  . ranitidine (ZANTAC) 150 MG tablet Take 1 tablet (150 mg total) by mouth 2 (two) times daily. PATIENT NEEDS OFFICE VISIT FOR ADDITIONAL REFILLS 60 tablet 0  . cyclobenzaprine (FLEXERIL) 10 MG tablet Take 1 tablet (10 mg total) by mouth at bedtime. (Patient not taking: Reported on 03/28/2016) 30 tablet 0  . meloxicam (MOBIC) 15 MG tablet Take 1 tablet (15 mg total) by mouth daily. For shoulder/back pain (Patient not taking: Reported on 07/06/2015) 30 tablet 1   No current facility-administered medications on file prior to visit.     No Known Allergies  Objective:  BP 120/70 (BP Location: Right Arm, Patient Position: Sitting, Cuff Size: Small)   Pulse 92   Temp 98.9 F (37.2 C) (Oral)   Resp 18   Ht 5\' 2"   (1.575 m)   Wt 169 lb (76.7 kg)   SpO2 98%   BMI 30.91 kg/m   Physical Exam  Constitutional: She is oriented to person, place, and time and well-developed, well-nourished, and in no distress. No distress.  Cardiovascular: Normal rate, regular rhythm and normal heart sounds.   Pulmonary/Chest: Effort normal and breath sounds normal. No respiratory distress.  Abdominal: Soft. Bowel sounds are normal. She exhibits no distension and no mass. There is tenderness (epigastric and LUQ).  Neurological: She is alert and oriented to person, place, and time. GCS score is 15.  Skin: Skin is warm and dry.  Psychiatric: Mood, memory, affect and judgment normal.  Vitals reviewed.  Dg Abd 1 View  Result Date: 03/28/2016 CLINICAL DATA:  Stomach pain starting a week ago in the right upper quadrant. EXAM: ABDOMEN - 1 VIEW COMPARISON:  None. FINDINGS: The bowel gas pattern is normal. No radio-opaque calculi or other significant radiographic abnormality are seen. Extensive bowel content is identified throughout colon. IMPRESSION: No bowel obstruction.  Constipation. Electronically Signed   By: Sherian Rein M.D.   On: 03/28/2016 17:55    Assessment and Plan :  1. Other constipation - DG Abd 1 View; Future - POCT urine pregnancy - polyethylene glycol powder (GLYCOLAX/MIRALAX) powder; Take 17 g by mouth 2 (two) times daily as needed.  Dispense: 500 g; Refill: 1 - Instructions printed out and given to patient on constipation treatment with colace + miralax.  - Patient education materials  printed out and given to patient about constipation.   2. Dyspepsia - ranitidine (ZANTAC) 150 MG tablet; Take 1 tablet (150 mg total) by mouth 2 (two) times daily. PATIENT NEEDS OFFICE VISIT FOR ADDITIONAL REFILLS  Dispense: 60 tablet; Refill: 0    Marco CollieWhitney Jannette Cotham, PA-C  [facility] 03/28/2016 5:30 PM

## 2016-03-28 NOTE — Patient Instructions (Addendum)
For constipation   Make sure you are drinking enough water daily. Make sure you are getting enough fiber in your diet - this will make you regular - you can eat high fiber foods or use metamucil as a supplement - it is really important to drink enough water when using fiber supplements.  If your stools are hard or are formed balls or you have to strain a stool softener will help - use colace 2-3 capsule a day  For gentle treatment of constipation Use Miralax 1-2 capfuls a day until your stools are soft and regular and then decrease the usage - you can use this daily  For more aggressive treatment of constipation Use 4 capfuls of Colace and 6 doses of Miralax and drink it in 2 hours - this should result in several watery stools - if it does not repeat the next day and then go to daily miralax for a week to make sure your bowels are clean and retrained to work properly  For the most aggressive treatment of constipation Use 14 capfuls of Miralax in 1 gallon of fluid (gatoraid or water work well or a combination of the two) and drink over 12h - it is ok to eat during this time and then use Miralax 1 capful daily for about 2 weeks to prevent the constipation from returning     IF you received an x-ray today, you will receive an invoice from [facility]. Please contact [facility] at [phone] with questions or concerns regarding your invoice.   IF you received labwork today, you will receive an invoice from [facility]. Please contact [facility] at [phone] with questions or concerns regarding your invoice.   Our billing staff will not be able to assist you with questions regarding bills from these companies.  You will be contacted with the lab results as soon as they are available. The fastest way to get your results is to activate your My Chart account. Instructions are located on the last page of this paperwork. If you have not heard from us  regarding the results in 2 weeks, please contact this office.    Estreimiento - Adultos (Constipation, Adult) Estreimiento significa que una persona tiene menos de tres evacuaciones en una semana, dificultad para defecar, o que las heces son secas, duras, o ms grandes que lo normal. A medida que envejecemos el estreimiento es ms comn. Una dieta baja en fibra, no tomar suficientes lquidos y el uso de ciertos medicamentos pueden Scientist, research (life sciences)empeorar el estreimiento.  CAUSAS   Ciertos medicamentos, como los antidepresivos, analgsicos, suplementos de hierro, anticidos y diurticos.  Algunas enfermedades, como la diabetes, el sndrome del colon irritable, enfermedad de la tiroides, o depresin.  No beber suficiente agua.  No consumir suficientes alimentos ricos en fibra.  Situaciones de estrs o viajes.  Falta de actividad fsica o de ejercicio.  Ignorar la necesidad sbita de Advertising copywriterdefecar.  Uso en exceso de laxantes. SIGNOS Y SNTOMAS   Defecar menos de tres veces por semana.  Dificultad para defecar.  Tener las heces secas y duras, o ms grandes que las normales.  Sensacin de estar lleno o hinchado.  Dolor en la parte baja del abdomen.  No sentir alivio despus de defecar. DIAGNSTICO  El mdico le har una historia clnica y un examen fsico. Pueden hacerle exmenes adicionales para el estreimiento grave. Estos estudios pueden ser:  Un radiografa con enema de bario para examinar el recto, el colon y, en algunos casos, el intestino  delgado.  Una sigmoidoscopia para examinar el colon inferior.  Una colonoscopia para examinar todo el colon. TRATAMIENTO  El tratamiento depender de la gravedad del estreimiento y de la causa. Algunos tratamientos nutricionales son beber ms lquidos y comer ms alimentos ricos en fibra. El cambio en el estilo de vida incluye hacer ejercicios de [place] regular. Si estas recomendaciones para Public relations account executive dieta y en el estilo de vida no  ayudan, el mdico le puede indicar el uso de laxantes de venta libre para ayudarlo a Advertising copywriter. Los medicamentos recetados se pueden prescribir si los medicamentos de venta libre no lo [place].  INSTRUCCIONES PARA EL CUIDADO EN EL HOGAR   Consuma alimentos con alto contenido de [place], como frutas, vegetales, cereales integrales y porotos.  Limite los alimentos procesados ricos en grasas y azcar, como las papas fritas, hamburguesas, galletas, dulces y refrescos.  Puede agregar un suplemento de fibra a su dieta si no obtiene lo suficiente de los alimentos.  Beba suficiente lquido para Photographer orina clara o de color amarillo plido.  Haga ejercicio regularmente o segn las indicaciones del mdico.  Vaya al bao cuando sienta la necesidad de ir. No se aguante las ganas.  Tome solo medicamentos de venta libre o recetados, segn las indicaciones del mdico. No tome otros medicamentos para el estreimiento sin consultarlo antes con su mdico. SOLICITE ATENCIN MDICA DE INMEDIATO SI:   Observa sangre brillante en las heces.  El estreimiento dura ms de 4 das o [place].  Siente dolor abdominal o rectal.  Las heces son delgadas como un lpiz.  Pierde peso de [place] inexplicable. ASEGRESE DE QUE:   Comprende estas instrucciones.  Controlar su afeccin.  Recibir ayuda de inmediato si no mejora o si empeora.   Esta informacin no tiene Theme park manager el consejo del mdico. Asegrese de hacerle al mdico cualquier pregunta que tenga.   Document Released: 08/04/2007 Document Revised: 08/05/2014 Elsevier Interactive Patient Education [employer]

## 2017-05-07 ENCOUNTER — Inpatient Hospital Stay (HOSPITAL_COMMUNITY): Payer: Self-pay

## 2017-05-07 ENCOUNTER — Inpatient Hospital Stay (HOSPITAL_COMMUNITY)
Admission: AD | Admit: 2017-05-07 | Discharge: 2017-05-07 | Disposition: A | Discharge status: Home / Self Care | Payer: Self-pay | Source: Ambulatory Visit | Attending: Obstetrics and Gynecology | Admitting: Obstetrics and Gynecology

## 2017-05-07 ENCOUNTER — Encounter (HOSPITAL_COMMUNITY): Payer: Self-pay | Admitting: *Deleted

## 2017-05-07 DIAGNOSIS — R1032 Left lower quadrant pain: Secondary | ICD-10-CM | POA: Insufficient documentation

## 2017-05-07 DIAGNOSIS — O26891 Other specified pregnancy related conditions, first trimester: Secondary | ICD-10-CM | POA: Insufficient documentation

## 2017-05-07 DIAGNOSIS — F419 Anxiety disorder, unspecified: Secondary | ICD-10-CM | POA: Insufficient documentation

## 2017-05-07 DIAGNOSIS — Z3A01 Less than 8 weeks gestation of pregnancy: Secondary | ICD-10-CM | POA: Insufficient documentation

## 2017-05-07 DIAGNOSIS — O208 Other hemorrhage in early pregnancy: Secondary | ICD-10-CM | POA: Insufficient documentation

## 2017-05-07 DIAGNOSIS — O9989 Other specified diseases and conditions complicating pregnancy, childbirth and the puerperium: Secondary | ICD-10-CM | POA: Insufficient documentation

## 2017-05-07 DIAGNOSIS — O034 Incomplete spontaneous abortion without complication: Secondary | ICD-10-CM | POA: Insufficient documentation

## 2017-05-07 DIAGNOSIS — O209 Hemorrhage in early pregnancy, unspecified: Secondary | ICD-10-CM

## 2017-05-07 DIAGNOSIS — O99341 Other mental disorders complicating pregnancy, first trimester: Secondary | ICD-10-CM | POA: Insufficient documentation

## 2017-05-07 DIAGNOSIS — O09511 Supervision of elderly primigravida, first trimester: Secondary | ICD-10-CM | POA: Insufficient documentation

## 2017-05-07 DIAGNOSIS — R109 Unspecified abdominal pain: Secondary | ICD-10-CM

## 2017-05-07 DIAGNOSIS — E785 Hyperlipidemia, unspecified: Secondary | ICD-10-CM | POA: Insufficient documentation

## 2017-05-07 LAB — URINALYSIS, ROUTINE W REFLEX MICROSCOPIC
BILIRUBIN URINE: NEGATIVE
Glucose, UA: NEGATIVE mg/dL
Ketones, ur: NEGATIVE mg/dL
NITRITE: NEGATIVE
PH: 7 (ref 5.0–8.0)
Protein, ur: NEGATIVE mg/dL
Specific Gravity, Urine: 1.017 (ref 1.005–1.030)

## 2017-05-07 LAB — CBC
HEMATOCRIT: 40.7 % (ref 36.0–46.0)
HEMOGLOBIN: 13.7 g/dL (ref 12.0–15.0)
MCH: 28.1 pg (ref 26.0–34.0)
MCHC: 33.7 g/dL (ref 30.0–36.0)
MCV: 83.4 fL (ref 78.0–100.0)
Platelets: 200 10*3/uL (ref 150–400)
RBC: 4.88 MIL/uL (ref 3.87–5.11)
RDW: 13.4 % (ref 11.5–15.5)
WBC: 8.9 10*3/uL (ref 4.0–10.5)

## 2017-05-07 LAB — URINALYSIS, MICROSCOPIC (REFLEX)

## 2017-05-07 LAB — WET PREP, GENITAL
CLUE CELLS WET PREP: NONE SEEN
SPERM: NONE SEEN
TRICH WET PREP: NONE SEEN
YEAST WET PREP: NONE SEEN

## 2017-05-07 LAB — ABO/RH: ABO/RH(D): O POS

## 2017-05-07 LAB — HCG, QUANTITATIVE, PREGNANCY: hCG, Beta Chain, Quant, S: 13000 m[IU]/mL — ABNORMAL HIGH (ref <5)

## 2017-05-07 LAB — POCT PREGNANCY, URINE: Preg Test, Ur: POSITIVE — AB

## 2017-05-07 MED ORDER — ACETAMINOPHEN 500 MG PO TABS
500.0000 mg | ORAL_TABLET | Freq: Once | ORAL | Status: AC
  Administered 2017-05-07: 500 mg via ORAL
  Filled 2017-05-07: qty 1

## 2017-05-07 MED ORDER — OXYCODONE HCL 5 MG PO CAPS: 5.0000 mg | ORAL_CAPSULE | Freq: Four times a day (QID) | ORAL | 0 refills | Status: DC | PRN

## 2017-05-07 NOTE — Discharge Instructions (Signed)
Aborto incompleto °(Incomplete Miscarriage) °Un aborto espontáneo es la pérdida repentina de un bebé en gestación (feto) antes de la semana 20 del embarazo. En un aborto espontáneo, partes del feto o la placenta (alumbramiento) permanecen en el cuerpo. °El aborto espontáneo puede ser una experiencia que afecte emocionalmente a la persona. Hable con su médico si tiene preguntas sobre el aborto espontáneo, el proceso de duelo y los planes futuros de embarazo. °CAUSAS °· Algunos problemas cromosómicos pueden hacer imposible que el bebé se desarrolle normalmente. Los problemas con los genes o cromosomas del bebé son, en la mayoría de los casos, el resultado de errores que se producen, al azar, cuando el embrión se divide y crece. Estos problemas no se heredan de los padres. °· Infección en el cuello del útero. °· Problemas hormonales. °· Problemas en el cuello del útero, como tener un útero incompetente. Esto ocurre cuando los tejidos no son lo suficientemente fuertes como para contener el embarazo. °· Problemas del útero, como un útero con forma anormal, los fibromas o anormalidades congénitas. °· Ciertas enfermedades crónicas. °· No fume, no beba alcohol, ni consuma drogas. °· Traumatismos. ° °SÍNTOMAS °· Sangrado o manchado vaginal, con o sin cólicos o dolor. °· Dolor o cólicos en el abdomen o en la cintura. °· Eliminación de líquido, tejidos o coágulos grandes por la vagina. ° °DIAGNÓSTICO °El médico le hará un examen físico. También le indicará una ecografía para confirmar el aborto. Es posible que se realicen análisis de sangre. °TRATAMIENTO °· Generalmente se realiza un procedimiento de dilatación y curetaje (D y C). Durante el procedimiento de dilatación y curetaje, el cuello del útero se abre (dilata) y se retira todo resto de tejido fetal o placentario del útero. °· Si hay una infección, le recetarán antibióticos. Posiblemente le receten otros medicamentos para reducir (contraer) el tamaño del útero si hay  mucha hemorragia. °· Si su tipo de sangre es Rh negativo y el del bebé es Rh positivo, necesitará una inyección de inmunoglobulina Rho(D). Esta inyección protegerá a los futuros bebés de tener problemas de compatibilidad Rh en futuros embarazos. °· Probablemente le indiquen reposo. Esto significa que debe quedarse en cama y levantarse únicamente para ir al baño. ° °INSTRUCCIONES PARA EL CUIDADO EN EL HOGAR °· Haga reposo según las indicaciones del médico. °· Limite las actividades según las indicaciones del médico. Es posible que se le permita retomar las actividades livianas si no se le realizó un curetaje, pero necesitará tratamiento adicional. °· Lleve un registro de la cantidad de toallas sanitarias que usa por día. Observe cuán impregnadas (saturadas) están. Registre esta información. °· No  use tampones. °· No se haga duchas vaginales ni tenga relaciones sexuales hasta que el médico la autorice. °· Asista a todas las citas de seguimiento para una nueva evaluación y para continuar el tratamiento. °· Sólo tome medicamentos de venta libre o recetados para calmar el dolor, el malestar o bajar la fiebre, según las indicaciones de su médico. °· Tome los antibióticos como le indicó el médico. Asegúrese de que finaliza la prescripción completa aunque se sienta mejor. ° °SOLICITE ATENCIÓN MÉDICA DE INMEDIATO SI: °· Siente calambres intensos en el estómago, en la espalda o en el abdomen. °· Le sube la fiebre sin motivo (asegúrese de registrar las cifras). °· Elimina coágulos grandes o tejidos (consérvelos para que el médico los analice). °· La hemorragia aumenta. °· Se siente mareada, débil o tiene episodios de desmayo. ° °ASEGÚRESE DE QUE: °· Comprende estas instrucciones. °· Controlará su afección. °·   Recibirá ayuda de inmediato si no mejora o si empeora. ° °Esta información no tiene como fin reemplazar el consejo del médico. Asegúrese de hacerle al médico cualquier pregunta que tenga. °Document Released: 07/15/2005  Document Revised: 05/05/2013 Document Reviewed: 02/11/2013 °Elsevier Interactive Patient Education © 2017 Elsevier Inc. ° °

## 2017-05-07 NOTE — MAU Provider Note (Signed)
Chief Complaint: Abdominal Pain; Vaginal Bleeding; and Possible Pregnancy   First Provider Initiated Contact with Patient 05/07/17 1730        SUBJECTIVE HPI Cheryl Patrick is a 46 y.o. G5P2103 at 5-6 weeks by LMP who presents to maternity admissions reporting left-sided lower abdominal pain with vaginal bleeding and brown discharge that began today. Pain in abdomen is getting stronger and feels like cramping. Pt took a home pregnancy test weeks ago that indicated her current pregnancy, and attended an outside clinic yesterday to receive a free ultrasound that confirmed the pregnancy. Pt is unsure how far along she is, but estimates that she is 5-6 weeks  based on her last menstrual period. Pt describes the lower abdominal pain as constant, 7/10, and exacerbated by laying down (partially alleviated by sitting/standing upright). She denies vaginal burning/itching, headache, dizziness, nausea/vomiting, or fevers/chills. She endorses increased urinary frequency.    Past Medical History:  Diagnosis Date  . Anxiety   . Hyperlipemia    Past Surgical History:  Procedure Laterality Date  . NO PAST SURGERIES     Social History   Social History  . Marital status: Married    Spouse name: N/A  . Number of children: 3  . Years of education: N/A   Occupational History  . Shoe sales   .  [employer]   Social History Main Topics  . Smoking status: Never Smoker  . Smokeless tobacco: Never Used  . Alcohol use No  . Drug use: No  . Sexual activity: No   Other Topics Concern  . Not on file   Social History Narrative  . No narrative on file   No current facility-administered medications on file prior to encounter.    Current Outpatient Prescriptions on File Prior to Encounter  Medication Sig Dispense Refill  . polyethylene glycol powder (GLYCOLAX/MIRALAX) powder Take 17 g by mouth 2 (two) times daily as needed. 500 g 1  . ranitidine (ZANTAC) 150 MG tablet Take 1 tablet (150 mg total)  by mouth 2 (two) times daily. PATIENT NEEDS OFFICE VISIT FOR ADDITIONAL REFILLS 60 tablet 0   No Known Allergies  I have reviewed patient's Past Medical Hx, Surgical Hx, Family Hx, Social Hx, medications and allergies.   ROS:  Review of Systems  Constitutional: Negative for chills and fever.  Respiratory: Negative for shortness of breath.   Cardiovascular: Negative for chest pain.  Gastrointestinal: Negative for abdominal pain, constipation, diarrhea, nausea and vomiting.  Genitourinary: Positive for frequency, pelvic pain, vaginal bleeding and vaginal discharge. Negative for dysuria and flank pain.   Review of Systems  Other systems negative   Physical Exam  Physical Exam Patient Vitals for the past 24 hrs:  BP Temp Temp src Pulse Resp SpO2 Weight  05/07/17 1655 127/86 98.1 F (36.7 C) Other 83 16 99 % 78 kg (172 lb)   Constitutional: Well-developed, well-nourished female in no acute distress.  Cardiovascular: normal rate Respiratory: normal effort GI: Abd soft, tender in LLQ, non-distended. No guarding or rebound tenderness.  Pos BS x 4 MS: Extremities nontender, no edema, normal ROM Neurologic: Alert and oriented x 4.  GU: Neg CVAT.  PELVIC EXAM: Cervix pink, visually closed, without lesion, scant white creamy discharge, light blood, vaginal walls and external genitalia normal Bimanual exam: Cervix 0/long/high, firm, anterior, neg CMT, uterus nontender, nonenlarged, left adnexal tenderness  LAB RESULTS Results for orders placed or performed during the hospital encounter of 05/07/17 (from the past 24 hour(s))  Urinalysis, Routine  w reflex microscopic     Status: Abnormal   Collection Time: 05/07/17  4:59 PM  Result Value Ref Range   Color, Urine YELLOW YELLOW   APPearance CLEAR CLEAR   Specific Gravity, Urine 1.017 1.005 - 1.030   pH 7.0 5.0 - 8.0   Glucose, UA NEGATIVE NEGATIVE mg/dL   Hgb urine dipstick LARGE (A) NEGATIVE   Bilirubin Urine NEGATIVE NEGATIVE    Ketones, ur NEGATIVE NEGATIVE mg/dL   Protein, ur NEGATIVE NEGATIVE mg/dL   Nitrite NEGATIVE NEGATIVE   Leukocytes, UA TRACE (A) NEGATIVE  Urinalysis, Microscopic (reflex)     Status: Abnormal   Collection Time: 05/07/17  4:59 PM  Result Value Ref Range   RBC / HPF 0-5 0 - 5 RBC/hpf   WBC, UA 0-5 0 - 5 WBC/hpf   Bacteria, UA RARE (A) NONE SEEN   Squamous Epithelial / LPF 0-5 (A) NONE SEEN   Mucus PRESENT   Wet prep, genital     Status: Abnormal   Collection Time: 05/07/17  5:59 PM  Result Value Ref Range   Yeast Wet Prep HPF POC NONE SEEN NONE SEEN   Trich, Wet Prep NONE SEEN NONE SEEN   Clue Cells Wet Prep HPF POC NONE SEEN NONE SEEN   WBC, Wet Prep HPF POC MODERATE (A) NONE SEEN   Sperm NONE SEEN   Pregnancy, urine POC     Status: Abnormal   Collection Time: 05/07/17  6:03 PM  Result Value Ref Range   Preg Test, Ur POSITIVE (A) NEGATIVE  ABO/Rh     Status: None (Preliminary result)   Collection Time: 05/07/17  6:23 PM  Result Value Ref Range   ABO/RH(D) O POS   CBC     Status: None   Collection Time: 05/07/17  6:23 PM  Result Value Ref Range   WBC 8.9 4.0 - 10.5 K/uL   RBC 4.88 3.87 - 5.11 MIL/uL   Hemoglobin 13.7 12.0 - 15.0 g/dL   HCT 91.4 78.2 - 95.6 %   MCV 83.4 78.0 - 100.0 fL   MCH 28.1 26.0 - 34.0 pg   MCHC 33.7 30.0 - 36.0 g/dL   RDW 21.3 08.6 - 57.8 %   Platelets 200 150 - 400 K/uL  hCG, quantitative, pregnancy     Status: Abnormal   Collection Time: 05/07/17  6:23 PM  Result Value Ref Range   hCG, Beta Chain, Quant, S 13,000 (H) <5 mIU/mL    --/--/O POS (10/10 1823)  IMAGING US Ob Comp Less 14 Wks  Result Date: 05/07/2017 CLINICAL DATA:  Left-sided abdominal pain and acute bleeding tearing first-trimester pregnancy. Quantitative beta HCG 13,000. Gestational age by LMP is 9 weeks 2 days. EXAM: OBSTETRIC <14 WK [place] AND TRANSVAGINAL OB US TECHNIQUE: Both transabdominal and transvaginal ultrasound examinations were performed for complete evaluation of the  gestation as well as the maternal uterus, adnexal regions, and pelvic cul-de-sac. Transvaginal technique was performed to assess early pregnancy. COMPARISON:  None for this pregnancy. FINDINGS: Intrauterine gestational sac: A single gestational sac is elongated, extending towards the lower uterine segment. Yolk sac:  Irregular Embryo:  Present Cardiac Activity: Not present CRL:  7.7  Mm   6 w   4 d Subchorionic hemorrhage:  None visualized. Maternal uterus/adnexae: Uterus and adnexa are otherwise within normal limits. There is no significant free fluid. IMPRESSION: Irregular gestational sac and no cardiac activity within embryo with a crown-rump length of greater than 7 mm. Findings meet definitive criteria for  failed pregnancy. This follows SRU consensus guidelines: Diagnostic Criteria for Nonviable Pregnancy Early in the First Trimester. [facility] Mis J Med [phone]. Electronically Signed   By: Marin Roberts M.D.   On: 05/07/2017 20:01   US Ob Transvaginal  Result Date: 05/07/2017 CLINICAL DATA:  Left-sided abdominal pain and acute bleeding tearing first-trimester pregnancy. Quantitative beta HCG 13,000. Gestational age by LMP is 9 weeks 2 days. EXAM: OBSTETRIC <14 WK [place] AND TRANSVAGINAL OB US TECHNIQUE: Both transabdominal and transvaginal ultrasound examinations were performed for complete evaluation of the gestation as well as the maternal uterus, adnexal regions, and pelvic cul-de-sac. Transvaginal technique was performed to assess early pregnancy. COMPARISON:  None for this pregnancy. FINDINGS: Intrauterine gestational sac: A single gestational sac is elongated, extending towards the lower uterine segment. Yolk sac:  Irregular Embryo:  Present Cardiac Activity: Not present CRL:  7.7  Mm   6 w   4 d Subchorionic hemorrhage:  None visualized. Maternal uterus/adnexae: Uterus and adnexa are otherwise within normal limits. There is no significant free fluid. IMPRESSION: Irregular gestational sac and  no cardiac activity within embryo with a crown-rump length of greater than 7 mm. Findings meet definitive criteria for failed pregnancy. This follows SRU consensus guidelines: Diagnostic Criteria for Nonviable Pregnancy Early in the First Trimester. [facility] Mis J Med [id number]. Electronically Signed   By: Marin Roberts M.D.   On: 05/07/2017 20:01    MAU Management/MDM: Vitals and nursing note reviewed  Labs/imaging orders placed and reviewed Ordered usual first trimester r/o ectopic labs.   Pelvic exam and cultures done [place] ordered to rule out ectopic Tylenol given for pain in MAU  Cultures were done to rule out pelvic infection Blood drawn for Quant HCG, CBC, ABO/Rh  Hcg elevated [place] however with failed pregnancy  Discussed results with patient and management. Patient wants to have watchful waiting for incomplete abortion. She declines medication.   ASSESSMENT 1. Incomplete abortion   2. Vaginal bleeding in pregnancy, first trimester   3. Abdominal pain during pregnancy in first trimester   4. Vaginal bleeding affecting early pregnancy   5. Abdominal cramping     PLAN Discharge home ins table condition Follow-up in clinic in 7 days for miscarriage follow-up and repeat US Miscarriage precautions and bleeding precautions given Rx for pain medication given Handout provided Patient wants birth control at follow-up visit  Encouraged to return here if she develops worsening of symptoms, increase in pain, fever, or other concerning symptoms.   Josephine Igo 05/07/2017  8:19 PM   OB FELLOW MAU DISCHARGE ATTESTATION  I have seen and examined this patient. I agree with above documentation in medical student's note and have made edits as needed.   Caryl Ada, DO OB Fellow 5:02 PM

## 2017-05-07 NOTE — MAU Note (Signed)
Woke up with left sided abdominal pain Pain is getting stronger throughout the day Rating 8/10 Intermittent Cramping in nature  +vaginal bleeding Brown in color More noticeable when wipes  LMP 03/03/17  States had ultrasound on 05/06/17 at [address] in [place] was unable to see anything on ultrasound  Has an appointment in 3 weeks to repeat the ultrasound

## 2017-05-08 LAB — GC/CHLAMYDIA PROBE AMP (~~LOC~~) NOT AT ARMC
CHLAMYDIA, DNA PROBE: NEGATIVE
Neisseria Gonorrhea: NEGATIVE

## 2017-05-10 ENCOUNTER — Inpatient Hospital Stay (HOSPITAL_COMMUNITY): Payer: Self-pay

## 2017-05-10 ENCOUNTER — Inpatient Hospital Stay (HOSPITAL_COMMUNITY): Payer: Self-pay | Admitting: Anesthesiology

## 2017-05-10 ENCOUNTER — Encounter (HOSPITAL_COMMUNITY): Payer: Self-pay

## 2017-05-10 ENCOUNTER — Encounter (HOSPITAL_COMMUNITY)
Admission: AD | Disposition: A | Discharge status: Home / Self Care | Payer: Self-pay | Source: Ambulatory Visit | Attending: Obstetrics and Gynecology

## 2017-05-10 ENCOUNTER — Ambulatory Visit (HOSPITAL_COMMUNITY)
Admission: AD | Admit: 2017-05-10 | Discharge: 2017-05-10 | Disposition: A | Discharge status: Home / Self Care | Payer: Self-pay | Source: Ambulatory Visit | Attending: Obstetrics and Gynecology | Admitting: Obstetrics and Gynecology

## 2017-05-10 DIAGNOSIS — O031 Delayed or excessive hemorrhage following incomplete spontaneous abortion: Secondary | ICD-10-CM | POA: Insufficient documentation

## 2017-05-10 DIAGNOSIS — E785 Hyperlipidemia, unspecified: Secondary | ICD-10-CM | POA: Insufficient documentation

## 2017-05-10 DIAGNOSIS — O209 Hemorrhage in early pregnancy, unspecified: Secondary | ICD-10-CM

## 2017-05-10 DIAGNOSIS — F419 Anxiety disorder, unspecified: Secondary | ICD-10-CM | POA: Insufficient documentation

## 2017-05-10 DIAGNOSIS — O034 Incomplete spontaneous abortion without complication: Secondary | ICD-10-CM

## 2017-05-10 HISTORY — PX: DILATION AND EVACUATION: SHX1459

## 2017-05-10 LAB — CBC
HEMATOCRIT: 36.5 % (ref 36.0–46.0)
HEMATOCRIT: 29.4 % — AB (ref 36.0–46.0)
HEMATOCRIT: 30 % — AB (ref 36.0–46.0)
HEMOGLOBIN: 12 g/dL (ref 12.0–15.0)
Hemoglobin: 9.7 g/dL — ABNORMAL LOW (ref 12.0–15.0)
Hemoglobin: 9.8 g/dL — ABNORMAL LOW (ref 12.0–15.0)
MCH: 27.8 pg (ref 26.0–34.0)
MCH: 28.1 pg (ref 26.0–34.0)
MCH: 27.7 pg (ref 26.0–34.0)
MCHC: 32.9 g/dL (ref 30.0–36.0)
MCHC: 33 g/dL (ref 30.0–36.0)
MCHC: 32.7 g/dL (ref 30.0–36.0)
MCV: 84.7 fL (ref 78.0–100.0)
MCV: 85.2 fL (ref 78.0–100.0)
MCV: 84.7 fL (ref 78.0–100.0)
PLATELETS: 166 10*3/uL (ref 150–400)
Platelets: 188 10*3/uL (ref 150–400)
Platelets: 167 10*3/uL (ref 150–400)
RBC: 4.31 MIL/uL (ref 3.87–5.11)
RBC: 3.45 MIL/uL — AB (ref 3.87–5.11)
RBC: 3.54 MIL/uL — AB (ref 3.87–5.11)
RDW: 13.3 % (ref 11.5–15.5)
RDW: 13.5 % (ref 11.5–15.5)
RDW: 13.4 % (ref 11.5–15.5)
WBC: 15.2 10*3/uL — AB (ref 4.0–10.5)
WBC: 12.9 10*3/uL — AB (ref 4.0–10.5)
WBC: 13.3 10*3/uL — ABNORMAL HIGH (ref 4.0–10.5)

## 2017-05-10 LAB — TYPE AND SCREEN
ABO/RH(D): O POS
ANTIBODY SCREEN: NEGATIVE

## 2017-05-10 SURGERY — DILATION AND EVACUATION, UTERUS
Anesthesia: General | Site: Vagina

## 2017-05-10 MED ORDER — SOD CITRATE-CITRIC ACID 500-334 MG/5ML PO SOLN
30.0000 mL | Freq: Once | ORAL | Status: AC
  Administered 2017-05-10: 30 mL via ORAL

## 2017-05-10 MED ORDER — OXYCODONE HCL 5 MG PO TABS: 5.0000 mg | ORAL_TABLET | Freq: Once | ORAL | Status: DC | PRN

## 2017-05-10 MED ORDER — ONDANSETRON HCL 4 MG/2ML IJ SOLN
INTRAMUSCULAR | Status: DC | PRN
Start: 2017-05-10 — End: 2017-05-10
  Administered 2017-05-10: 4 mg via INTRAVENOUS

## 2017-05-10 MED ORDER — DOCUSATE SODIUM 100 MG PO CAPS: 100.0000 mg | ORAL_CAPSULE | Freq: Two times a day (BID) | ORAL | 2 refills | Status: DC

## 2017-05-10 MED ORDER — LIDOCAINE HCL 1 % IJ SOLN
INTRAMUSCULAR | Status: DC | PRN
  Administered 2017-05-10: 20 mL

## 2017-05-10 MED ORDER — FERROUS GLUCONATE 324 (38 FE) MG PO TABS: 324.0000 mg | ORAL_TABLET | Freq: Two times a day (BID) | ORAL | 0 refills | Status: DC

## 2017-05-10 MED ORDER — FAMOTIDINE IN NACL 20-0.9 MG/50ML-% IV SOLN
INTRAVENOUS | Status: AC
  Filled 2017-05-10: qty 50

## 2017-05-10 MED ORDER — SOD CITRATE-CITRIC ACID 500-334 MG/5ML PO SOLN
ORAL | Status: AC
  Filled 2017-05-10: qty 15

## 2017-05-10 MED ORDER — KETOROLAC TROMETHAMINE 30 MG/ML IJ SOLN
INTRAMUSCULAR | Status: DC | PRN
  Administered 2017-05-10: 30 mg via INTRAVENOUS

## 2017-05-10 MED ORDER — DEXAMETHASONE SODIUM PHOSPHATE 10 MG/ML IJ SOLN
INTRAMUSCULAR | Status: DC | PRN
  Administered 2017-05-10: 10 mg via INTRAVENOUS

## 2017-05-10 MED ORDER — FENTANYL CITRATE (PF) 100 MCG/2ML IJ SOLN
INTRAMUSCULAR | Status: AC
Start: 2017-05-10 — End: 2017-05-10
  Filled 2017-05-10: qty 2

## 2017-05-10 MED ORDER — PHENYLEPHRINE HCL 10 MG/ML IJ SOLN
INTRAMUSCULAR | Status: DC | PRN
  Administered 2017-05-10 (×2): 80 ug via INTRAVENOUS

## 2017-05-10 MED ORDER — DOXYCYCLINE HYCLATE 100 MG IV SOLR
100.0000 mg | Freq: Once | INTRAVENOUS | Status: AC
  Administered 2017-05-10: 100 mg via INTRAVENOUS
  Filled 2017-05-10: qty 100

## 2017-05-10 MED ORDER — HYDROMORPHONE HCL 1 MG/ML IJ SOLN: 0.2500 mg | INTRAMUSCULAR | Status: DC | PRN

## 2017-05-10 MED ORDER — OXYCODONE-ACETAMINOPHEN 5-325 MG PO TABS
2.0000 | ORAL_TABLET | Freq: Once | ORAL | Status: AC
  Administered 2017-05-10: 2 via ORAL
  Filled 2017-05-10: qty 2

## 2017-05-10 MED ORDER — SUFENTANIL CITRATE 50 MCG/ML IV SOLN
INTRAVENOUS | Status: DC | PRN
  Administered 2017-05-10: 50 ug via INTRAVENOUS

## 2017-05-10 MED ORDER — MIDAZOLAM HCL 2 MG/2ML IJ SOLN
INTRAMUSCULAR | Status: AC
  Filled 2017-05-10: qty 2

## 2017-05-10 MED ORDER — MEPERIDINE HCL 25 MG/ML IJ SOLN: 6.2500 mg | INTRAMUSCULAR | Status: DC | PRN

## 2017-05-10 MED ORDER — OXYCODONE HCL 5 MG/5ML PO SOLN: 5.0000 mg | Freq: Once | ORAL | Status: DC | PRN

## 2017-05-10 MED ORDER — LACTATED RINGERS IV SOLN
INTRAVENOUS | Status: DC
  Administered 2017-05-10 (×2): via INTRAVENOUS

## 2017-05-10 MED ORDER — LIDOCAINE HCL (CARDIAC) 20 MG/ML IV SOLN
INTRAVENOUS | Status: DC | PRN
  Administered 2017-05-10: 100 mg via INTRAVENOUS

## 2017-05-10 MED ORDER — LACTATED RINGERS IV BOLUS (SEPSIS)
1000.0000 mL | Freq: Once | INTRAVENOUS | Status: AC
  Administered 2017-05-10: 1000 mL via INTRAVENOUS

## 2017-05-10 MED ORDER — PROMETHAZINE HCL 25 MG/ML IJ SOLN
12.5000 mg | Freq: Once | INTRAMUSCULAR | Status: AC
  Administered 2017-05-10: 12.5 mg via INTRAVENOUS
  Filled 2017-05-10: qty 1

## 2017-05-10 MED ORDER — PROMETHAZINE HCL 25 MG/ML IJ SOLN: 6.2500 mg | INTRAMUSCULAR | Status: DC | PRN

## 2017-05-10 MED ORDER — SCOPOLAMINE 1 MG/3DAYS TD PT72
MEDICATED_PATCH | TRANSDERMAL | Status: DC | PRN
  Administered 2017-05-10: 1 via TRANSDERMAL

## 2017-05-10 MED ORDER — MISOPROSTOL 200 MCG PO TABS
800.0000 ug | ORAL_TABLET | Freq: Once | ORAL | Status: DC
  Filled 2017-05-10: qty 4

## 2017-05-10 MED ORDER — PROPOFOL 500 MG/50ML IV EMUL
INTRAVENOUS | Status: DC | PRN
Start: 2017-05-10 — End: 2017-05-10
  Administered 2017-05-10: 150 mg via INTRAVENOUS

## 2017-05-10 MED ORDER — FAMOTIDINE IN NACL 20-0.9 MG/50ML-% IV SOLN
20.0000 mg | Freq: Once | INTRAVENOUS | Status: AC
  Administered 2017-05-10: 20 mg via INTRAVENOUS

## 2017-05-10 SURGICAL SUPPLY — 18 items
CATH ROBINSON RED A/P 16FR (CATHETERS) ×3 IMPLANT
DECANTER SPIKE VIAL GLASS SM (MISCELLANEOUS) ×3 IMPLANT
GLOVE BIOGEL PI IND STRL 7.0 (GLOVE) ×1 IMPLANT
GLOVE BIOGEL PI INDICATOR 7.0 (GLOVE) ×2
GLOVE SURG SS PI 7.0 STRL IVOR (GLOVE) ×3 IMPLANT
GOWN STRL REUS W/TWL LRG LVL3 (GOWN DISPOSABLE) ×3 IMPLANT
GOWN STRL REUS W/TWL XL LVL3 (GOWN DISPOSABLE) ×3 IMPLANT
KIT BERKELEY 1ST TRIMESTER 3/8 (MISCELLANEOUS) ×3 IMPLANT
NS IRRIG 1000ML POUR BTL (IV SOLUTION) ×3 IMPLANT
PACK VAGINAL MINOR WOMEN LF (CUSTOM PROCEDURE TRAY) ×3 IMPLANT
PAD OB MATERNITY 4.3X12.25 (PERSONAL CARE ITEMS) ×3 IMPLANT
PAD PREP 24X48 CUFFED NSTRL (MISCELLANEOUS) ×3 IMPLANT
SET BERKELEY SUCTION TUBING (SUCTIONS) ×3 IMPLANT
TOWEL OR 17X24 6PK STRL BLUE (TOWEL DISPOSABLE) ×6 IMPLANT
VACURETTE 10 RIGID CVD (CANNULA) ×2 IMPLANT
VACURETTE 7MM CVD STRL WRAP (CANNULA) IMPLANT
VACURETTE 8 RIGID CVD (CANNULA) ×2 IMPLANT
VACURETTE 9 RIGID CVD (CANNULA) IMPLANT

## 2017-05-10 NOTE — Progress Notes (Signed)
Dr. Vergie Living made aware of CBC results no new orders received at this time.

## 2017-05-10 NOTE — Progress Notes (Addendum)
See formal MAU Note  Heavy bleeding with incomplete AB at approx 7wks based on u/s. VS are currently normal and stable (HR in the 70s-80s in the room and normotensive). No active bleeding currently but chux pad has about 100-219mL of blood thus far. Interpreter used and recommend suction d&c which they are amenable to.  CBC Latest Ref Rng & Units 05/10/2017 05/07/2017 07/06/2015  WBC 4.0 - 10.5 K/uL 15.2(H) 8.9 9.7  Hemoglobin 12.0 - 15.0 g/dL 16.1 09.6 04.5  Hematocrit 36.0 - 46.0 % 36.5 40.7 40.0  Platelets 150 - 400 K/uL 188 200 214   O POS  House coverage aware Doxy 100 IV pre op Rpt CBC in PACU  Cornelia Copa MD Attending [facility] (Faculty Practice) 05/10/2017 Time: [phone]

## 2017-05-10 NOTE — Discharge Instructions (Signed)
Dilatacin y curetaje o curetaje por aspiracin, cuidados posteriores (Dilation and Curettage or Vacuum Curettage, Care After) Estas indicaciones le proporcionan informacin acerca de cmo deber cuidarse despus del procedimiento. El mdico tambin podr darle instrucciones especficas. Comunquese con el mdico si tiene algn problema o tiene preguntas despus del procedimiento. CUIDADOS EN EL HOGAR  No conduzca durante 24horas.  Espere 1 semana antes de realizar actividades que la cansen mucho.  Tmese la temperatura dos (2) veces al da, durante 4 das. Antelas. Comunquese con su mdico si tiene fiebre.  No permanezca de pie durante mucho tiempo.  No levante, empuje ni jale objetos de ms de 10 libras (4,5 kilogramos).  Solo suba escaleras una o dos veces por da.  Descanse con frecuencia.  Siga con su dieta habitual.  Beba suficiente lquido para mantener el pis (orina) claro o de color amarillo plido.  Si tiene dificultad para mover el intestino (estreimiento), puede hacer lo siguiente: ? Tome algn medicamento que la ayude a mover el intestino (laxante) segn las indicaciones de su mdico. ? Consuma ms fruta y salvado. ? Beba ms lquidos.  Tome una ducha, no un bao de inmersin, durante el tiempo que le indique su mdico.  No practique natacin ni use el jacuzzi hasta que el mdico la autorice.  Pdale a alguien que se quede con usted durante 1 o 2das despus del procedimiento.  No se haga duchas vaginales, no use tampones ni tenga sexo (relaciones sexuales) durante 2 semanas.  Solo tome los medicamentos que le haya indicado su mdico. No tome aspirina. Puede ocasionar hemorragias.  Cumpla con los controles mdicos.  SOLICITE AYUDA SI:  Tiene clicos o siente un dolor que no puede controlar con los medicamentos.  Siente dolor en el vientre (abdomen).  Advierte un olor ftido que proviene de la vagina.  Tiene una erupcin cutnea.  Tiene problemas con  los medicamentos.  SOLICITE AYUDA DE INMEDIATO SI:  Tiene una hemorragia ms abundante que un perodo normal.  Tiene fiebre.  Siente dolor en el pecho.  Tiene dificultad para respirar.  Se siente mareada o siente como si fuera a desmayarse (vahdo).  Se desmaya.  Siente dolor en la zona superior de los hombros.  Tiene una hemorragia vaginal, con o sin grumos de sangre (cogulos sanguneos).  ASEGRESE DE QUE:  Comprende estas instrucciones.  Controlar su afeccin.  Recibir ayuda de inmediato si no mejora o si empeora.  Esta informacin no tiene como fin reemplazar el consejo del mdico. Asegrese de hacerle al mdico cualquier pregunta que tenga. Document Released: 03/13/2011 Document Revised: 07/20/2013 Document Reviewed: 02/11/2013 Elsevier Interactive Patient Education  2017 Elsevier Inc.  

## 2017-05-10 NOTE — Transfer of Care (Signed)
Immediate Anesthesia Transfer of Care Note  Patient: Cheryl Patrick  Procedure(s) Performed: DILATATION AND EVACUATION (N/A Vagina )  Patient Location: PACU  Anesthesia Type:General  Level of Consciousness: awake and sedated  Airway & Oxygen Therapy: Patient Spontanous Breathing and Patient connected to nasal cannula oxygen  Post-op Assessment: Report given to RN  Post vital signs: Reviewed and stable  Last Vitals:  Vitals:   05/10/17 1833 05/10/17 1916  BP:  (!) 91/49  Pulse:  65  Resp:    Temp:    SpO2: 99% 100%    Last Pain:  Vitals:   05/10/17 1632  TempSrc:   PainSc: 9          Complications: No apparent anesthesia complications

## 2017-05-10 NOTE — Anesthesia Procedure Notes (Signed)
Procedure Name: Intubation Date/Time: 05/10/2017 7:35 PM Performed by: Gerrard Crystal, Sheron Nightingale Pre-anesthesia Checklist: Patient identified, Emergency Drugs available, Suction available, Patient being monitored and Timeout performed Patient Re-evaluated:Patient Re-evaluated prior to induction Oxygen Delivery Method: Circle system utilized Induction Type: IV induction and Cricoid Pressure applied Laryngoscope Size: Mac and 3 Grade View: Grade I Laser Tube: Cuffed inflated with minimal occlusive pressure - saline Number of attempts: 1 Airway Equipment and Method: Video-laryngoscopy Placement Confirmation: ETT inserted through vocal cords under direct vision,  positive ETCO2 and breath sounds checked- equal and bilateral Secured at: 21 cm Dental Injury: Teeth and Oropharynx as per pre-operative assessment

## 2017-05-10 NOTE — Progress Notes (Signed)
Rn called Dr. Rachelle Hora regarding patient's platelet count of 86,000. Dr Rachelle Hora said to continue the motrin.

## 2017-05-10 NOTE — MAU Note (Signed)
Pt to OR.

## 2017-05-10 NOTE — MAU Note (Signed)
Pt had a syncopal episode today at home. Arrived EMS. Has had a lot of bleeding, changing a pad every 5 minutes and saturating. Having abdominal pain 9/10.

## 2017-05-10 NOTE — Op Note (Signed)
Operative Note   05/10/2017  PRE-OP DIAGNOSIS: Incomplete abortion. Heavy vaginal bleeding. Failed expectant management of missed abortion   POST-OP DIAGNOSIS: Same.  SURGEON: Surgeon(s) and Role:    * Naydelin Ziegler, Billey Gosling, MD - Primary  ASSISTANT: None  PROCEDURE:  Suction dilation and curettage  ANESTHESIA: GETA and paracervical block  ESTIMATED BLOOD LOSS: 25mL  DRAINS: I/O cath UOP per anesthesia report  TOTAL IV FLUIDS: per anesthesia report  SPECIMENS: products of conception to pathology  VTE PROPHYLAXIS: SCDs to the bilateral lower extremities  ANTIBIOTICS: Doxycycline  IV x 1 pre op  COMPLICATIONS: none  DISPOSITION: PACU - hemodynamically stable.  CONDITION: stable  BLOOD TYPE: O POS. Rhogam given:not applicable  FINDINGS: Exam under anesthesia revealed 8-10 week sized uterus with no masses and bilateral adnexa without masses or fullness. Necrotic appearing products of conception were seen, with gritty texture in all four quadrants.   PROCEDURE IN DETAIL:  After informed consent was obtained, the patient was taken to the operating room where anesthesia was obtained without difficulty. The patient was positioned in the dorsal lithotomy position in [place] stirrups. The patient was examined under anesthesia, with the above noted findings.  The bi-valved speculum was placed inside the patient's vagina, and the the anterior lip of the cervix was seen and grasped with the tenaculum.  A paracervical block was achieved with 20mL of 1% lidocaine and the cervix easily passed a #10 cannula. The suction was then calibrated to and connected to the number 10 cannula, which was then introduced with the above noted findings. A gentle curettage was done at the end and yield no products of conception.   The suction was then done one more time to remove any remaining curettage material.   Excellent hemostasis was noted, and all instruments were removed, with excellent hemostasis  noted throughout.  She was then taken out of dorsal lithotomy. The patient tolerated the procedure well.  Sponge, lap and instrument counts were correct x2.  The patient was taken to recovery room in excellent condition.  Cornelia Copa MD Attending Center for [facility])

## 2017-05-10 NOTE — Anesthesia Preprocedure Evaluation (Addendum)
Anesthesia Evaluation  Patient identified by MRN, date of birth, ID band Patient awake    Reviewed: Allergy & Precautions, NPO status , Patient's Chart, lab work & pertinent test results  Airway Mallampati: III  TM Distance: >3 FB Neck ROM: Full    Dental no notable dental hx.    Pulmonary neg pulmonary ROS,    Pulmonary exam normal breath sounds clear to auscultation       Cardiovascular negative cardio ROS Normal cardiovascular exam Rhythm:Regular Rate:Normal     Neuro/Psych negative neurological ROS  negative psych ROS   GI/Hepatic negative GI ROS, Neg liver ROS,   Endo/Other  negative endocrine ROS  Renal/GU negative Renal ROS     Musculoskeletal negative musculoskeletal ROS (+)   Abdominal (+) + obese,   Peds  Hematology negative hematology ROS (+)   Anesthesia Other Findings   Reproductive/Obstetrics negative OB ROS                             Anesthesia Physical Anesthesia Plan  ASA: III and emergent  Anesthesia Plan: General   Post-op Pain Management:    Induction: Intravenous, Rapid sequence and Cricoid pressure planned  PONV Risk Score and Plan: 3 and Ondansetron, Dexamethasone, Midazolam and Scopolamine patch - Pre-op  Airway Management Planned: Video Laryngoscope Planned and Oral ETT  Additional Equipment:   Intra-op Plan:   Post-operative Plan: Extubation in OR  Informed Consent: I have reviewed the patients History and Physical, chart, labs and discussed the procedure including the risks, benefits and alternatives for the proposed anesthesia with the patient or authorized representative who has indicated his/her understanding and acceptance.   Dental advisory given  Plan Discussed with: CRNA  Anesthesia Plan Comments:        Anesthesia Quick Evaluation

## 2017-05-10 NOTE — H&P (Signed)
Chief Complaint: Loss of Consciousness; Vaginal Bleeding; and Abdominal Pain   None     SUBJECTIVE HPI: Cheryl Patrick is a 46 y.o. [phone] at [redacted]w[redacted]d by LMP who presents to maternity admissions via EMS for syncopal episode at home and heavy vaginal bleeding today. She was seen in MAU on 05/07/17 and US showed 7 week IUP without cardiac activity, definitive for failed pregnancy.  She was offered medication but selected expectant management with follow up in [facility] in 1 week. Today, her bleeding increased and she reports sitting on the toilet at home with blood pouring out when she lost consciousness. Her daughter was present with her and did not let her fall.  She called 911 and EMS brough the pt to MAU. She reports dizziness and cramping abdominal pain with heavy bleeding. She has not tried any treatments. There are no other associated symptoms. She denies vaginal bleeding, vaginal itching/burning, urinary symptoms, h/a, dizziness, n/v, or fever/chills.     HPI  Past Medical History:  Diagnosis Date  . Anxiety   . Hyperlipemia    Past Surgical History:  Procedure Laterality Date  . NO PAST SURGERIES     Social History   Social History  . Marital status: Married    Spouse name: N/A  . Number of children: 3  . Years of education: N/A   Occupational History  . Shoe sales   .  [employer]   Social History Main Topics  . Smoking status: Never Smoker  . Smokeless tobacco: Never Used  . Alcohol use No  . Drug use: No  . Sexual activity: No   Other Topics Concern  . Not on file   Social History Narrative  . No narrative on file   No current facility-administered medications on file prior to encounter.    Current Outpatient Prescriptions on File Prior to Encounter  Medication Sig Dispense Refill  . oxycodone (OXY-IR) 5 MG capsule Take 1 capsule (5 mg total) by mouth every 6 (six) hours as needed. 10 capsule 0  . polyethylene glycol powder (GLYCOLAX/MIRALAX) powder Take 17  g by mouth 2 (two) times daily as needed. 500 g 1  . ranitidine (ZANTAC) 150 MG tablet Take 1 tablet (150 mg total) by mouth 2 (two) times daily. PATIENT NEEDS OFFICE VISIT FOR ADDITIONAL REFILLS 60 tablet 0   No Known Allergies  ROS:  Review of Systems  Constitutional: Negative for chills, fatigue and fever.  Respiratory: Negative for shortness of breath.   Cardiovascular: Negative for chest pain.  Gastrointestinal: Positive for abdominal pain.  Genitourinary: Positive for pelvic pain. Negative for difficulty urinating, dysuria, flank pain, vaginal bleeding, vaginal discharge and vaginal pain.  Neurological: Negative for dizziness and headaches.  Psychiatric/Behavioral: Negative.      I have reviewed patient's Past Medical Hx, Surgical Hx, Family Hx, Social Hx, medications and allergies.   Physical Exam   Patient Vitals for the past 24 hrs:  BP Temp Temp src Pulse Resp SpO2  05/10/17 1631 125/71 97.8 F (36.6 C) Oral 86 20 96 %   Constitutional: Well-developed, well-nourished female in no acute distress.  Cardiovascular: normal rate Respiratory: normal effort GI: Abd soft, non-tender. Pos BS x 4 MS: Extremities nontender, no edema, normal ROM Neurologic: Alert and oriented x 4.  GU: Neg CVAT.  PELVIC EXAM: Cervix pink, visually closed, without lesion, large amount dark red bleeding with large clots, vaginal walls and external genitalia normal Bimanual exam: Cervix 0/long/high, firm, anterior, neg CMT, uterus  tender, ~ 7 week size, adnexa without tenderness, enlargement, or mass   LAB RESULTS Results for orders placed or performed during the hospital encounter of 05/10/17 (from the past 24 hour(s))  CBC     Status: Abnormal   Collection Time: 05/10/17  5:39 PM  Result Value Ref Range   WBC 15.2 (H) 4.0 - 10.5 K/uL   RBC 4.31 3.87 - 5.11 MIL/uL   Hemoglobin 12.0 12.0 - 15.0 g/dL   HCT 16.1 09.6 - 04.5 %   MCV 84.7 78.0 - 100.0 fL   MCH 27.8 26.0 - 34.0 pg   MCHC 32.9  30.0 - 36.0 g/dL   RDW 40.9 81.1 - 91.4 %   Platelets 188 150 - 400 K/uL    --/--/O POS (10/10 1823)  IMAGING US Ob Comp Less 14 Wks  Result Date: 05/07/2017 CLINICAL DATA:  Left-sided abdominal pain and acute bleeding tearing first-trimester pregnancy. Quantitative beta HCG 13,000. Gestational age by LMP is 9 weeks 2 days. EXAM: OBSTETRIC <14 WK [place] AND TRANSVAGINAL OB US TECHNIQUE: Both transabdominal and transvaginal ultrasound examinations were performed for complete evaluation of the gestation as well as the maternal uterus, adnexal regions, and pelvic cul-de-sac. Transvaginal technique was performed to assess early pregnancy. COMPARISON:  None for this pregnancy. FINDINGS: Intrauterine gestational sac: A single gestational sac is elongated, extending towards the lower uterine segment. Yolk sac:  Irregular Embryo:  Present Cardiac Activity: Not present CRL:  7.7  Mm   6 w   4 d Subchorionic hemorrhage:  None visualized. Maternal uterus/adnexae: Uterus and adnexa are otherwise within normal limits. There is no significant free fluid. IMPRESSION: Irregular gestational sac and no cardiac activity within embryo with a crown-rump length of greater than 7 mm. Findings meet definitive criteria for failed pregnancy. This follows SRU consensus guidelines: Diagnostic Criteria for Nonviable Pregnancy Early in the First Trimester. Macy Mis J Med [id number]. Electronically Signed   By: Marin Roberts M.D.   On: 05/07/2017 20:01   US Ob Transvaginal  Result Date: 05/10/2017 CLINICAL DATA:  Heavy vaginal bleeding. EXAM: TRANSVAGINAL OB ULTRASOUND TECHNIQUE: Transvaginal ultrasound was performed for complete evaluation of the gestation as well as the maternal uterus, adnexal regions, and pelvic cul-de-sac. COMPARISON:  05/07/2017 FINDINGS: Intrauterine gestational sac: Single Yolk sac:  Single Embryo:  Present Cardiac Activity: Absent CRL:   0.99 cm 7 w 0 d Subchorionic hemorrhage:  None visualized.  IMPRESSION: Irregular large gestational sac containing an embryo with no cardiac activity greater than 7 mm. Findings meet definitive criteria for failed pregnancy. This follows SRU consensus guidelines: Diagnostic Criteria for Nonviable Pregnancy Early in the First Trimester. Macy Mis J Med [phone]. Electronically Signed   By: Ted Mcalpine M.D.   On: 05/10/2017 17:23   US Ob Transvaginal  Result Date: 05/07/2017 CLINICAL DATA:  Left-sided abdominal pain and acute bleeding tearing first-trimester pregnancy. Quantitative beta HCG 13,000. Gestational age by LMP is 9 weeks 2 days. EXAM: OBSTETRIC <14 WK [place] AND TRANSVAGINAL OB US TECHNIQUE: Both transabdominal and transvaginal ultrasound examinations were performed for complete evaluation of the gestation as well as the maternal uterus, adnexal regions, and pelvic cul-de-sac. Transvaginal technique was performed to assess early pregnancy. COMPARISON:  None for this pregnancy. FINDINGS: Intrauterine gestational sac: A single gestational sac is elongated, extending towards the lower uterine segment. Yolk sac:  Irregular Embryo:  Present Cardiac Activity: Not present CRL:  7.7  Mm   6 w   4 d Subchorionic hemorrhage:  None visualized.  Maternal uterus/adnexae: Uterus and adnexa are otherwise within normal limits. There is no significant free fluid. IMPRESSION: Irregular gestational sac and no cardiac activity within embryo with a crown-rump length of greater than 7 mm. Findings meet definitive criteria for failed pregnancy. This follows SRU consensus guidelines: Diagnostic Criteria for Nonviable Pregnancy Early in the First Trimester. Macy Mis J Med [id number]. Electronically Signed   By: Marin Roberts M.D.   On: 05/07/2017 20:01    MAU Management/MDM: Ordered labs and reviewed results.  Hgb stable with slight drop from 13.7 to 12.0 in 3 days.  [place] indicates missed ab with POCs still in uterus. Pt with normal VS and hgb but bleeding large  amount and syncope at home so not stable for discharge and Cytotec at home.  Discussed use of medication here in MAU today vs D&C and pt would like to try medications.  Cytotec 800 mcg buccal dose ordered.  Percocet 5/325 x 2 tabs PO and Phenergan 12.5 mg IV given for pain and nausea.   Pt sat up to take Cytotec but vomited and became pale.  BP lying down was noted to be 80s/60s.  Consult Dr Vergie Living, Cytotec was not given.  Dr Vergie Living to bedside.  Pt consented and prepped for OR for D&C.   Sharen Counter Certified Nurse-Midwife 05/10/2017  6:03 PM

## 2017-05-11 ENCOUNTER — Encounter (HOSPITAL_COMMUNITY): Payer: Self-pay | Admitting: Obstetrics and Gynecology

## 2017-05-11 NOTE — Anesthesia Postprocedure Evaluation (Signed)
Anesthesia Post Note  Patient: Cheryl Patrick  Procedure(s) Performed: DILATATION AND EVACUATION (N/A Vagina )     Patient location during evaluation: PACU Anesthesia Type: General Level of consciousness: sedated and patient cooperative Pain management: pain level controlled Vital Signs Assessment: post-procedure vital signs reviewed and stable Respiratory status: spontaneous breathing Cardiovascular status: stable Anesthetic complications: no    Last Vitals:  Vitals:   05/10/17 2315 05/10/17 2330  BP: 98/67 112/72  Pulse: 85 92  Resp: 20 20  Temp:  37.1 C  SpO2: 97% 98%    Last Pain:  Vitals:   05/10/17 2330  TempSrc: Oral  PainSc:    Pain Goal:                 Lewie Loron

## 2017-06-12 ENCOUNTER — Ambulatory Visit: Payer: Self-pay | Admitting: Obstetrics and Gynecology

## 2017-06-12 ENCOUNTER — Encounter: Payer: Self-pay | Admitting: Obstetrics and Gynecology

## 2017-06-12 VITALS — BP 116/70 | HR 70 | Ht 60.0 in | Wt 171.0 lb

## 2017-06-12 DIAGNOSIS — Z9889 Other specified postprocedural states: Secondary | ICD-10-CM

## 2017-06-12 DIAGNOSIS — R4589 Other symptoms and signs involving emotional state: Secondary | ICD-10-CM

## 2017-06-12 DIAGNOSIS — Z09 Encounter for follow-up examination after completed treatment for conditions other than malignant neoplasm: Secondary | ICD-10-CM

## 2017-06-12 NOTE — Progress Notes (Signed)
Patient verbally consented to meet with Behavioral Health Clinician about presenting concerns. Dr Pickens aware  

## 2017-06-12 NOTE — Progress Notes (Signed)
[facility] 06/12/2017  CC: regular post op visit  Subjective:    S/p 10/13 suction d&c for failed medical management of at 7wks. She was discharged from the pacu. Patient is doing well and no bleeding feels like a period is about to start.    Objective:    BP 116/70   Pulse 70   Ht 5' (1.524 m)   Wt 171 lb (77.6 kg)   LMP 03/03/2017   Breastfeeding? No   BMI 33.40 kg/m   NAD  Assessment:    Doing well postoperatively.    Plan:   Long d/w pt re: normal post op course, and how unfortunately it was likely due to something she can't control and that's AMA; she states it wasn't planned but they were excited about the pregnancy. I told her it was okay to try again but likely will have difficulty getting pregnant and then high chance of miscarriage and complications to just be emotionally ready for that possibility.   Pt to let us know if she doesn't have a period in the next few weeks. She's catholic and options d/w her. She was using NFP but her periods were sometimes coming closer together to it was harder to do. Condoms, spermacide d/w her  Interpreter used.   Cheryl Patrick, Jr MD Attending [facility](Faculty Practice)

## 2019-03-11 ENCOUNTER — Other Ambulatory Visit: Payer: Self-pay

## 2019-03-11 DIAGNOSIS — Z20822 Contact with and (suspected) exposure to covid-19: Secondary | ICD-10-CM

## 2019-03-12 LAB — NOVEL CORONAVIRUS, NAA: SARS-CoV-2, NAA: NOT DETECTED

## 2019-06-30 ENCOUNTER — Encounter (HOSPITAL_COMMUNITY): Payer: Self-pay | Admitting: Emergency Medicine

## 2019-06-30 ENCOUNTER — Emergency Department (HOSPITAL_COMMUNITY): Payer: HRSA Program

## 2019-06-30 ENCOUNTER — Inpatient Hospital Stay (HOSPITAL_COMMUNITY)
Admission: EM | Admit: 2019-06-30 | Discharge: 2019-07-05 | DRG: 177 | Disposition: A | Discharge status: Home / Self Care | Payer: HRSA Program | Attending: Internal Medicine | Admitting: Internal Medicine

## 2019-06-30 ENCOUNTER — Other Ambulatory Visit: Payer: Self-pay

## 2019-06-30 DIAGNOSIS — F419 Anxiety disorder, unspecified: Secondary | ICD-10-CM | POA: Diagnosis present

## 2019-06-30 DIAGNOSIS — U071 COVID-19: Secondary | ICD-10-CM | POA: Diagnosis present

## 2019-06-30 DIAGNOSIS — Z23 Encounter for immunization: Secondary | ICD-10-CM | POA: Diagnosis not present

## 2019-06-30 DIAGNOSIS — J1289 Other viral pneumonia: Secondary | ICD-10-CM | POA: Diagnosis present

## 2019-06-30 DIAGNOSIS — E119 Type 2 diabetes mellitus without complications: Secondary | ICD-10-CM | POA: Diagnosis present

## 2019-06-30 DIAGNOSIS — Z79899 Other long term (current) drug therapy: Secondary | ICD-10-CM

## 2019-06-30 DIAGNOSIS — E872 Acidosis: Secondary | ICD-10-CM | POA: Diagnosis present

## 2019-06-30 DIAGNOSIS — Z8249 Family history of ischemic heart disease and other diseases of the circulatory system: Secondary | ICD-10-CM | POA: Diagnosis not present

## 2019-06-30 DIAGNOSIS — Z833 Family history of diabetes mellitus: Secondary | ICD-10-CM | POA: Diagnosis not present

## 2019-06-30 DIAGNOSIS — Z7984 Long term (current) use of oral hypoglycemic drugs: Secondary | ICD-10-CM | POA: Diagnosis not present

## 2019-06-30 DIAGNOSIS — J9601 Acute respiratory failure with hypoxia: Secondary | ICD-10-CM | POA: Diagnosis present

## 2019-06-30 DIAGNOSIS — Z807 Family history of other malignant neoplasms of lymphoid, hematopoietic and related tissues: Secondary | ICD-10-CM

## 2019-06-30 DIAGNOSIS — E785 Hyperlipidemia, unspecified: Secondary | ICD-10-CM | POA: Diagnosis present

## 2019-06-30 DIAGNOSIS — J189 Pneumonia, unspecified organism: Secondary | ICD-10-CM | POA: Diagnosis not present

## 2019-06-30 HISTORY — DX: Type 2 diabetes mellitus without complications: E11.9

## 2019-06-30 LAB — CBC WITH DIFFERENTIAL/PLATELET
Abs Immature Granulocytes: 0.03 10*3/uL (ref 0.00–0.07)
Basophils Absolute: 0 10*3/uL (ref 0.0–0.1)
Basophils Relative: 0 %
Eosinophils Absolute: 0 10*3/uL (ref 0.0–0.5)
Eosinophils Relative: 0 %
HCT: 40.3 % (ref 36.0–46.0)
Hemoglobin: 12.8 g/dL (ref 12.0–15.0)
Immature Granulocytes: 1 %
Lymphocytes Relative: 18 %
Lymphs Abs: 1.1 10*3/uL (ref 0.7–4.0)
MCH: 26.7 pg (ref 26.0–34.0)
MCHC: 31.8 g/dL (ref 30.0–36.0)
MCV: 84 fL (ref 80.0–100.0)
Monocytes Absolute: 0.3 10*3/uL (ref 0.1–1.0)
Monocytes Relative: 6 %
Neutro Abs: 4.5 10*3/uL (ref 1.7–7.7)
Neutrophils Relative %: 75 %
Platelets: 165 10*3/uL (ref 150–400)
RBC: 4.8 MIL/uL (ref 3.87–5.11)
RDW: 13.2 % (ref 11.5–15.5)
WBC: 6 10*3/uL (ref 4.0–10.5)
nRBC: 0 % (ref 0.0–0.2)

## 2019-06-30 LAB — COMPREHENSIVE METABOLIC PANEL
ALT: 36 U/L (ref 0–44)
AST: 45 U/L — ABNORMAL HIGH (ref 15–41)
Albumin: 3.5 g/dL (ref 3.5–5.0)
Alkaline Phosphatase: 60 U/L (ref 38–126)
Anion gap: 10 (ref 5–15)
BUN: 10 mg/dL (ref 6–20)
CO2: 26 mmol/L (ref 22–32)
Calcium: 8.4 mg/dL — ABNORMAL LOW (ref 8.9–10.3)
Chloride: 100 mmol/L (ref 98–111)
Creatinine, Ser: 0.66 mg/dL (ref 0.44–1.00)
GFR calc Af Amer: >60 mL/min (ref >60)
GFR calc non Af Amer: >60 mL/min (ref >60)
Glucose, Bld: 169 mg/dL — ABNORMAL HIGH (ref 70–99)
Potassium: 3.5 mmol/L (ref 3.5–5.1)
Sodium: 136 mmol/L (ref 135–145)
Total Bilirubin: 0.5 mg/dL (ref 0.3–1.2)
Total Protein: 7.4 g/dL (ref 6.5–8.1)

## 2019-06-30 LAB — CBG MONITORING, ED: Glucose-Capillary: 230 mg/dL — ABNORMAL HIGH (ref 70–99)

## 2019-06-30 MED ORDER — INSULIN ASPART 100 UNIT/ML ~~LOC~~ SOLN
0.0000 [IU] | Freq: Three times a day (TID) | SUBCUTANEOUS | Status: DC
  Administered 2019-07-01: 5 [IU] via SUBCUTANEOUS
  Administered 2019-07-01: 7 [IU] via SUBCUTANEOUS
  Administered 2019-07-01: 3 [IU] via SUBCUTANEOUS
  Administered 2019-07-02: 7 [IU] via SUBCUTANEOUS
  Administered 2019-07-02 (×2): 5 [IU] via SUBCUTANEOUS
  Administered 2019-07-03: 2 [IU] via SUBCUTANEOUS
  Administered 2019-07-03: 3 [IU] via SUBCUTANEOUS
  Administered 2019-07-03: 7 [IU] via SUBCUTANEOUS
  Administered 2019-07-04: 5 [IU] via SUBCUTANEOUS
  Administered 2019-07-04: 7 [IU] via SUBCUTANEOUS
  Administered 2019-07-04: 5 [IU] via SUBCUTANEOUS
  Administered 2019-07-05: 7 [IU] via SUBCUTANEOUS
  Filled 2019-06-30: qty 0.09

## 2019-06-30 MED ORDER — DEXAMETHASONE SODIUM PHOSPHATE 10 MG/ML IJ SOLN
6.0000 mg | Freq: Every day | INTRAMUSCULAR | Status: DC
  Administered 2019-07-01 – 2019-07-03 (×4): 6 mg via INTRAVENOUS
  Filled 2019-06-30 (×4): qty 1

## 2019-06-30 MED ORDER — ENOXAPARIN SODIUM 40 MG/0.4ML ~~LOC~~ SOLN
40.0000 mg | Freq: Every day | SUBCUTANEOUS | Status: DC
  Administered 2019-06-30 – 2019-07-02 (×3): 40 mg via SUBCUTANEOUS
  Filled 2019-06-30 (×3): qty 0.4

## 2019-06-30 MED ORDER — SODIUM CHLORIDE 0.9 % IV SOLN
1.0000 g | Freq: Every day | INTRAVENOUS | Status: DC
  Administered 2019-06-30 – 2019-07-01 (×2): 1 g via INTRAVENOUS
  Filled 2019-06-30: qty 1
  Filled 2019-06-30 (×2): qty 10

## 2019-06-30 MED ORDER — INSULIN ASPART 100 UNIT/ML ~~LOC~~ SOLN
0.0000 [IU] | Freq: Every day | SUBCUTANEOUS | Status: DC
  Administered 2019-06-30 – 2019-07-02 (×3): 2 [IU] via SUBCUTANEOUS
  Administered 2019-07-03 – 2019-07-04 (×2): 3 [IU] via SUBCUTANEOUS
  Filled 2019-06-30: qty 0.05

## 2019-06-30 MED ORDER — IPRATROPIUM BROMIDE HFA 17 MCG/ACT IN AERS
2.0000 | INHALATION_SPRAY | Freq: Once | RESPIRATORY_TRACT | Status: AC
  Administered 2019-06-30: 2 via RESPIRATORY_TRACT
  Filled 2019-06-30: qty 12.9

## 2019-06-30 MED ORDER — ALBUTEROL SULFATE HFA 108 (90 BASE) MCG/ACT IN AERS
6.0000 | INHALATION_SPRAY | Freq: Once | RESPIRATORY_TRACT | Status: AC
  Administered 2019-06-30: 6 via RESPIRATORY_TRACT
  Filled 2019-06-30: qty 6.7

## 2019-06-30 MED ORDER — SODIUM CHLORIDE 0.9 % IV SOLN
500.0000 mg | Freq: Every day | INTRAVENOUS | Status: DC
  Administered 2019-06-30 – 2019-07-01 (×2): 500 mg via INTRAVENOUS
  Filled 2019-06-30 (×3): qty 500

## 2019-06-30 MED ORDER — HYDROCODONE-HOMATROPINE 5-1.5 MG/5ML PO SYRP
5.0000 mL | ORAL_SOLUTION | Freq: Once | ORAL | Status: AC
  Administered 2019-06-30: 5 mL via ORAL
  Filled 2019-06-30: qty 5

## 2019-06-30 MED ORDER — METHYLPREDNISOLONE SODIUM SUCC 125 MG IJ SOLR
125.0000 mg | Freq: Once | INTRAMUSCULAR | Status: AC
  Administered 2019-06-30: 125 mg via INTRAVENOUS
  Filled 2019-06-30: qty 2

## 2019-06-30 NOTE — ED Notes (Signed)
Pt states ok to tell her daughter Estill Bamberg information about her medical status daughter phone number [phone]

## 2019-06-30 NOTE — ED Notes (Signed)
Pt ambulated O2 dropped from 94 to 89

## 2019-06-30 NOTE — ED Notes (Signed)
Interpreter Estill Bamberg [phone]-   Used to inform pt of medications being given. Pt with no questions or concerns at this time.

## 2019-06-30 NOTE — ED Provider Notes (Signed)
[facility] DEPT Provider Note   CSN: [id number] Arrival date & time: 06/30/19  1623     History   Chief Complaint Chief Complaint  Patient presents with  . covid+  . Cough  . Shortness of Breath    HPI Cheryl Patrick is a 48 y.o. female.     Patient complains of shortness of breath.  Patient has Covid.  The history is provided by the patient. No language interpreter was used.  Cough Cough characteristics:  Dry and non-productive Sputum characteristics:  Nondescript Severity:  Mild Onset quality:  Sudden Timing:  Constant Progression:  Worsening Chronicity:  New Relieved by:  Nothing Worsened by:  Nothing Associated symptoms: shortness of breath   Associated symptoms: no chest pain, no eye discharge, no headaches and no rash   Shortness of Breath Associated symptoms: cough   Associated symptoms: no abdominal pain, no chest pain, no headaches and no rash     Past Medical History:  Diagnosis Date  . Anxiety   . Diabetes mellitus without complication ([place])   . Hyperlipemia     Patient Active Problem List   Diagnosis Date Noted  . DYSPEPSIA 09/20/2009  . ABDOMINAL PAIN OTHER SPECIFIED SITE 09/20/2009  . CHEST PAIN 09/12/2008    Past Surgical History:  Procedure Laterality Date  . DILATION AND EVACUATION N/A 05/10/2017   Procedure: DILATATION AND EVACUATION;  Surgeon: Aletha Halim, MD;  Location: [facility];  Service: Gynecology;  Laterality: N/A;  . NO PAST SURGERIES       OB History    Gravida  5   Para  3   Term  2   Preterm  1   AB      Living  3     SAB      TAB      Ectopic      Multiple      Live Births  3            Home Medications    Prior to Admission medications   Medication Sig Start Date End Date Taking? Authorizing Provider  acetaminophen (TYLENOL) 500 MG tablet Take 500 mg by mouth every 6 (six) hours as needed for moderate pain.   Yes [provider]  metFORMIN  (GLUCOPHAGE) 500 MG tablet Take 500 mg by mouth 2 (two) times daily. 03/21/19  Yes [provider]  ferrous gluconate (FERGON) 324 MG tablet Take 1 tablet (324 mg total) by mouth 2 (two) times daily with a meal. Patient not taking: Reported on 06/30/2019 05/10/17   Aletha Halim, MD    Family History Family History  Problem Relation Age of Onset  . Lymphoma Mother   . Hypertension Mother   . Diabetes Father     Social History Social History   Tobacco Use  . Smoking status: Never Smoker  . Smokeless tobacco: Never Used  Substance Use Topics  . Alcohol use: No  . Drug use: No     Allergies   Patient has no known allergies.   Review of Systems Review of Systems  Constitutional: Negative for appetite change and fatigue.  HENT: Negative for congestion, ear discharge and sinus pressure.   Eyes: Negative for discharge.  Respiratory: Positive for cough and shortness of breath.   Cardiovascular: Negative for chest pain.  Gastrointestinal: Negative for abdominal pain and diarrhea.  Genitourinary: Negative for frequency and hematuria.  Musculoskeletal: Negative for back pain.  Skin: Negative for rash.  Neurological: Negative for seizures  and headaches.  Psychiatric/Behavioral: Negative for hallucinations.     Physical Exam Updated Vital Signs BP 130/82   Pulse 93   Temp 99.5 F (37.5 C) (Oral)   Resp (!) 32   LMP 06/13/2019   SpO2 93%   Physical Exam Vitals signs and nursing note reviewed.  Constitutional:      Appearance: She is well-developed.  HENT:     Head: Normocephalic.     Nose: Nose normal.  Eyes:     General: No scleral icterus.    Conjunctiva/sclera: Conjunctivae normal.  Neck:     Musculoskeletal: Neck supple.     Thyroid: No thyromegaly.  Cardiovascular:     Rate and Rhythm: Regular rhythm.     Heart sounds: No murmur. No friction rub. No gallop.      Comments: Tachycardic Pulmonary:     Breath sounds: No stridor. No wheezing or  rales.     Comments: Tachypneic Chest:     Chest wall: No tenderness.  Abdominal:     General: There is no distension.     Tenderness: There is no abdominal tenderness. There is no rebound.  Musculoskeletal: Normal range of motion.  Lymphadenopathy:     Cervical: No cervical adenopathy.  Skin:    Findings: No erythema or rash.  Neurological:     Mental Status: She is alert and oriented to person, place, and time.     Motor: No abnormal muscle tone.     Coordination: Coordination normal.  Psychiatric:        Behavior: Behavior normal.      ED Treatments / Results  Labs (all labs ordered are listed, but only abnormal results are displayed) Labs Reviewed  COMPREHENSIVE METABOLIC PANEL - Abnormal; Notable for the following components:      Result Value   Glucose, Bld 169 (*)    Calcium 8.4 (*)    AST 45 (*)    All other components within normal limits  CBC WITH DIFFERENTIAL/PLATELET    EKG None  Radiology Dg Chest Port 1 View  Result Date: 06/30/2019 CLINICAL DATA:  Short of breath COVID positive EXAM: PORTABLE CHEST 1 VIEW COMPARISON:  10/23/2013 FINDINGS: Patchy mostly peripheral and basilar ground-glass opacities. No pleural effusion. Normal heart size. No pneumothorax. IMPRESSION: Patchy bilateral ground-glass and interstitial opacities, felt consistent with multifocal pneumonia and history of COVID positivity. Electronically Signed   By: Jasmine PangKim  Fujinaga M.D.   On: 06/30/2019 18:11    Procedures Procedures (including critical care time)  Medications Ordered in ED Medications  HYDROcodone-homatropine (HYCODAN) 5-1.5 MG/5ML syrup 5 mL (has no administration in time range)  methylPREDNISolone sodium succinate (SOLU-MEDROL) 125 mg/2 mL injection 125 mg (125 mg Intravenous Given 06/30/19 1733)  albuterol (VENTOLIN HFA) 108 (90 Base) MCG/ACT inhaler 6 puff (6 puffs Inhalation Given 06/30/19 1735)  ipratropium (ATROVENT HFA) inhaler 2 puff (2 puffs Inhalation Given 06/30/19  1734)     Initial Impression / Assessment and Plan / ED Course  I have reviewed the triage vital signs and the nursing notes.  Pertinent labs & imaging results that were available during my care of the patient were reviewed by me and considered in my medical decision making (see chart for details).       Cheryl Patrick was evaluated in Emergency Department on 06/30/2019 for the symptoms described in the history of present illness. She was evaluated in the context of the global COVID-19 pandemic, which necessitated consideration that the patient might be at risk for infection  with the SARS-CoV-2 virus that causes COVID-19. Institutional protocols and algorithms that pertain to the evaluation of patients at risk for COVID-19 are in a state of rapid change based on information released by regulatory bodies including the CDC and federal and state organizations. These policies and algorithms were followed during the patient's care in the ED. Patient became hypoxic at 85% O2 sat while she was walking without room air.  She will be admitted to hospital for Covid  Final Clinical Impressions(s) / ED Diagnoses   Final diagnoses:  COVID-19    ED Discharge Orders    None       Bethann Berkshire, MD 06/30/19 2100

## 2019-06-30 NOTE — H&P (Signed)
TRH H&P    Patient Demographics:    Cheryl Patrick, is a 48 y.o. female  MRN: [id number]  DOB - 1970-09-30  Admit Date - 06/30/2019  Referring MD/NP/PA: Zammitt  Outpatient Primary MD for the patient is System, Pcp Not In  Patient coming from:  home  Chief complaint-  dyspnea   HPI:    Cheryl Patrick  is a 48 y.o. female, w Anxiety,  hyperlipidemia, Dm2, covid-19 positive at [facility] tx with zpak and albuterol, apparently presents with c/o dyspnea , worse with exertion.  Pt notes fever for the past 3-4 days. and cough w white sputum. Pt notes slight n/v, diarrhea as well.    In ED,  T 99.5, P 108, R 24, Bp 120/70  Pox 96% on RA,  91% on Ra  CXR IMPRESSION: Patchy bilateral ground-glass and interstitial opacities, felt consistent with multifocal pneumonia and history of COVID Positivity.  Wbc 6.0, Hgb 12.8, Plt 165 Na 136, K 3.5, Bun 10, Creatinne 066 Ast 45, Alt 36,   Pt will be admitted for dyspnea secondary to Covid-19 infection , CAP     Review of systems:    In addition to the HPI above,  No Fever-chills, No Headache, No changes with Vision or hearing, No problems swallowing food or Liquids, No Chest pain, No Abdominal pain, No Nausea or Vomiting, bowel movements are regular, No Blood in stool or Urine, No dysuria, No new skin rashes or bruises, No new joints pains-aches,  No new weakness, tingling, numbness in any extremity, No recent weight gain or loss, No polyuria, polydypsia or polyphagia, No significant Mental Stressors.  All other systems reviewed and are negative.    Past History of the following :    Past Medical History:  Diagnosis Date  . Anxiety   . Diabetes mellitus without complication (HCC)   . Hyperlipemia       Past Surgical History:  Procedure Laterality Date  . DILATION AND EVACUATION N/A 05/10/2017   Procedure: DILATATION AND  EVACUATION;  Surgeon: Knightsen Bing, MD;  Location: [facility];  Service: Gynecology;  Laterality: N/A;  . NO PAST SURGERIES        Social History:      Social History   Tobacco Use  . Smoking status: Never Smoker  . Smokeless tobacco: Never Used  Substance Use Topics  . Alcohol use: No       Family History :     Family History  Problem Relation Age of Onset  . Lymphoma Mother   . Hypertension Mother   . Diabetes Father        Home Medications:   Prior to Admission medications   Medication Sig Start Date End Date Taking? Authorizing Provider  acetaminophen (TYLENOL) 500 MG tablet Take 500 mg by mouth every 6 (six) hours as needed for moderate pain.   Yes [provider]  metFORMIN (GLUCOPHAGE) 500 MG tablet Take 500 mg by mouth 2 (two) times daily. 03/21/19  Yes [provider]  ferrous gluconate (FERGON) 324 MG  tablet Take 1 tablet (324 mg total) by mouth 2 (two) times daily with a meal. Patient not taking: Reported on 06/30/2019 05/10/17   Aletha Halim, MD     Allergies:    No Known Allergies   Physical Exam:   Vitals  Blood pressure 130/82, pulse 93, temperature 99.5 F (37.5 C), temperature source Oral, resp. rate (!) 32, last menstrual period 06/13/2019, SpO2 93 %.  1.  General: axoxo3  2. Psychiatric: euthymic  3. Neurologic: nonfocal  4. HEENMT:  Anicteric, pupils 1.54mm symmetric, direct, consensual, intact Neck: no jvd  5. Respiratory : Slight crackles bilateral base, no wheezing,   6. Cardiovascular : rrr s1, s2, no m/g/r  7. Gastrointestinal:  Abd: soft, nt, nd, +bs  8. Skin:  Ext: no c/c/e, no rash  9.Musculoskeletal:  Good rom    Data Review:    CBC Recent Labs  Lab 06/30/19 1738  WBC 6.0  HGB 12.8  HCT 40.3  PLT 165  MCV 84.0  MCH 26.7  MCHC 31.8  RDW 13.2  LYMPHSABS 1.1  MONOABS 0.3  EOSABS 0.0  BASOSABS 0.0    ------------------------------------------------------------------------------------------------------------------  Results for orders placed or performed during the hospital encounter of 06/30/19 (from the past 48 hour(s))  CBC with Differential     Status: None   Collection Time: 06/30/19  5:38 PM  Result Value Ref Range   WBC 6.0 4.0 - 10.5 K/uL   RBC 4.80 3.87 - 5.11 MIL/uL   Hemoglobin 12.8 12.0 - 15.0 g/dL   HCT 40.3 36.0 - 46.0 %   MCV 84.0 80.0 - 100.0 fL   MCH 26.7 26.0 - 34.0 pg   MCHC 31.8 30.0 - 36.0 g/dL   RDW 13.2 11.5 - 15.5 %   Platelets 165 150 - 400 K/uL   nRBC 0.0 0.0 - 0.2 %   Neutrophils Relative % 75 %   Neutro Abs 4.5 1.7 - 7.7 K/uL   Lymphocytes Relative 18 %   Lymphs Abs 1.1 0.7 - 4.0 K/uL   Monocytes Relative 6 %   Monocytes Absolute 0.3 0.1 - 1.0 K/uL   Eosinophils Relative 0 %   Eosinophils Absolute 0.0 0.0 - 0.5 K/uL   Basophils Relative 0 %   Basophils Absolute 0.0 0.0 - 0.1 K/uL   Immature Granulocytes 1 %   Abs Immature Granulocytes 0.03 0.00 - 0.07 K/uL    Comment: Performed at [facility], [place] [address] [place], [place] [address]  Comprehensive metabolic panel     Status: Abnormal   Collection Time: 06/30/19  5:38 PM  Result Value Ref Range   Sodium 136 135 - 145 mmol/L   Potassium 3.5 3.5 - 5.1 mmol/L   Chloride 100 98 - 111 mmol/L   CO2 26 22 - 32 mmol/L   Glucose, Bld 169 (H) 70 - 99 mg/dL   BUN 10 6 - 20 mg/dL   Creatinine, Ser 0.66 0.44 - 1.00 mg/dL   Calcium 8.4 (L) 8.9 - 10.3 mg/dL   Total Protein 7.4 6.5 - 8.1 g/dL   Albumin 3.5 3.5 - 5.0 g/dL   AST 45 (H) 15 - 41 U/L   ALT 36 0 - 44 U/L   Alkaline Phosphatase 60 38 - 126 U/L   Total Bilirubin 0.5 0.3 - 1.2 mg/dL   GFR calc non Af Amer >60 >60 mL/min   GFR calc Af Amer >60 >60 mL/min   Anion gap 10 5 - 15    Comment:  Performed at [facility], [address] [place], [place] [address]    Chemistries  Recent Labs  Lab 06/30/19  1738  NA 136  K 3.5  CL 100  CO2 26  GLUCOSE 169*  BUN 10  CREATININE 0.66  CALCIUM 8.4*  AST 45*  ALT 36  ALKPHOS 60  BILITOT 0.5   ------------------------------------------------------------------------------------------------------------------  ------------------------------------------------------------------------------------------------------------------ GFR: CrCl cannot be calculated (Unknown ideal weight.). Liver Function Tests: Recent Labs  Lab 06/30/19 1738  AST 45*  ALT 36  ALKPHOS 60  BILITOT 0.5  PROT 7.4  ALBUMIN 3.5   No results for input(s): LIPASE, AMYLASE in the last 168 hours. No results for input(s): AMMONIA in the last 168 hours. Coagulation Profile: No results for input(s): INR, PROTIME in the last 168 hours. Cardiac Enzymes: No results for input(s): CKTOTAL, CKMB, CKMBINDEX, TROPONINI in the last 168 hours. BNP (last 3 results) No results for input(s): PROBNP in the last 8760 hours. HbA1C: No results for input(s): HGBA1C in the last 72 hours. CBG: No results for input(s): GLUCAP in the last 168 hours. Lipid Profile: No results for input(s): CHOL, HDL, LDLCALC, TRIG, CHOLHDL, LDLDIRECT in the last 72 hours. Thyroid Function Tests: No results for input(s): TSH, T4TOTAL, FREET4, T3FREE, THYROIDAB in the last 72 hours. Anemia Panel: No results for input(s): VITAMINB12, FOLATE, FERRITIN, TIBC, IRON, RETICCTPCT in the last 72 hours.  --------------------------------------------------------------------------------------------------------------- Urine analysis:    Component Value Date/Time   COLORURINE YELLOW 05/07/2017 1659   APPEARANCEUR CLEAR 05/07/2017 1659   LABSPEC 1.017 05/07/2017 1659   PHURINE 7.0 05/07/2017 1659   GLUCOSEU NEGATIVE 05/07/2017 1659   HGBUR LARGE (A) 05/07/2017 1659   HGBUR negative 09/20/2009 1140   BILIRUBINUR NEGATIVE 05/07/2017 1659   BILIRUBINUR negative 07/06/2015 1915   BILIRUBINUR neg 03/15/2014 1257    KETONESUR NEGATIVE 05/07/2017 1659   PROTEINUR NEGATIVE 05/07/2017 1659   UROBILINOGEN 0.2 07/06/2015 1915   UROBILINOGEN 0.2 09/20/2009 1140   NITRITE NEGATIVE 05/07/2017 1659   LEUKOCYTESUR TRACE (A) 05/07/2017 1659      Imaging Results:    Dg Chest Port 1 View  Result Date: 06/30/2019 CLINICAL DATA:  Short of breath COVID positive EXAM: PORTABLE CHEST 1 VIEW COMPARISON:  10/23/2013 FINDINGS: Patchy mostly peripheral and basilar ground-glass opacities. No pleural effusion. Normal heart size. No pneumothorax. IMPRESSION: Patchy bilateral ground-glass and interstitial opacities, felt consistent with multifocal pneumonia and history of COVID positivity. Electronically Signed   By: Jasmine PangKim  Fujinaga M.D.   On: 06/30/2019 18:11   ST at 100, nl axis, no st-t changes c/w ischemia   Assessment & Plan:    Principal Problem:   COVID-19 virus infection Active Problems:   CAP (community acquired pneumonia)  Dyspnea secondary to Covid -19 infection, CAP  Covid-19 Decadron 6mg  iv qday Remdesivir consult  CAP Blood culture x2 Urine strep antigen Urine legionella antigen Rocephin 1gm iv qday Zithromax 500mg  iv qday  Dm2 Hold Metformin  fsbs ac and  Qhs, ISS   DVT Prophylaxis-   Lovenox - SCDs   AM Labs Ordered, also please review Full Orders  Family Communication: Admission, patients condition and plan of care including tests being ordered have been discussed with the patient  who indicate understanding and agree with the plan and Code Status.  Code Status:  FULL CODE per patient  Admission status: Inpatient: Based on patients clinical presentation and evaluation of above clinical data, I have made determination that patient meets Inpatient criteria at this time. Pt failed outpatient  oral therapy for pneumonia, and will require iv abx, as well as remdesivir and decadron iv for covid-19.  Pt has high risk of clinical deterioration due to diabetes, and obeisity.  Pt will require > 2  nites stay.   Time spent in minutes : 55 minutes   Pearson Grippe M.D on 06/30/2019 at 10:25 PM

## 2019-06-30 NOTE — ED Triage Notes (Signed)
Pt reports tested positive last week ([place]) for Covid. Having cough w/ white phlegm and SOB that is worse with exertion. Pt lives with daughter who is also Covid+. Dr Nancy Fetter prescribed albuterol. Z pak and liquid medication last week, which at first was helping.  [place] #[id number] spanish interpretor used.

## 2019-07-01 ENCOUNTER — Encounter (HOSPITAL_COMMUNITY): Payer: Self-pay

## 2019-07-01 LAB — SARS CORONAVIRUS 2 (TAT 6-24 HRS): SARS Coronavirus 2: POSITIVE — AB

## 2019-07-01 LAB — POC SARS CORONAVIRUS 2 AG -  ED: SARS Coronavirus 2 Ag: NEGATIVE

## 2019-07-01 LAB — TYPE AND SCREEN
ABO/RH(D): O POS
Antibody Screen: NEGATIVE

## 2019-07-01 LAB — COMPREHENSIVE METABOLIC PANEL
ALT: 36 U/L (ref 0–44)
AST: 40 U/L (ref 15–41)
Albumin: 3.5 g/dL (ref 3.5–5.0)
Alkaline Phosphatase: 59 U/L (ref 38–126)
Anion gap: 16 — ABNORMAL HIGH (ref 5–15)
BUN: 10 mg/dL (ref 6–20)
CO2: 22 mmol/L (ref 22–32)
Calcium: 8.7 mg/dL — ABNORMAL LOW (ref 8.9–10.3)
Chloride: 98 mmol/L (ref 98–111)
Creatinine, Ser: 0.47 mg/dL (ref 0.44–1.00)
GFR calc Af Amer: >60 mL/min (ref >60)
GFR calc non Af Amer: >60 mL/min (ref >60)
Glucose, Bld: 211 mg/dL — ABNORMAL HIGH (ref 70–99)
Potassium: 3.6 mmol/L (ref 3.5–5.1)
Sodium: 136 mmol/L (ref 135–145)
Total Bilirubin: 0.4 mg/dL (ref 0.3–1.2)
Total Protein: 7.6 g/dL (ref 6.5–8.1)

## 2019-07-01 LAB — CBC WITH DIFFERENTIAL/PLATELET
Abs Immature Granulocytes: 0.01 10*3/uL (ref 0.00–0.07)
Basophils Absolute: 0 10*3/uL (ref 0.0–0.1)
Basophils Relative: 0 %
Eosinophils Absolute: 0 10*3/uL (ref 0.0–0.5)
Eosinophils Relative: 0 %
HCT: 41 % (ref 36.0–46.0)
Hemoglobin: 13 g/dL (ref 12.0–15.0)
Immature Granulocytes: 0 %
Lymphocytes Relative: 12 %
Lymphs Abs: 0.4 10*3/uL — ABNORMAL LOW (ref 0.7–4.0)
MCH: 26.3 pg (ref 26.0–34.0)
MCHC: 31.7 g/dL (ref 30.0–36.0)
MCV: 83 fL (ref 80.0–100.0)
Monocytes Absolute: 0.1 10*3/uL (ref 0.1–1.0)
Monocytes Relative: 3 %
Neutro Abs: 2.9 10*3/uL (ref 1.7–7.7)
Neutrophils Relative %: 85 %
Platelets: 178 10*3/uL (ref 150–400)
RBC: 4.94 MIL/uL (ref 3.87–5.11)
RDW: 13.1 % (ref 11.5–15.5)
WBC: 3.4 10*3/uL — ABNORMAL LOW (ref 4.0–10.5)
nRBC: 0 % (ref 0.0–0.2)

## 2019-07-01 LAB — ABO/RH: ABO/RH(D): O POS

## 2019-07-01 LAB — D-DIMER, QUANTITATIVE: D-Dimer, Quant: 0.55 ug/mL-FEU — ABNORMAL HIGH (ref 0.00–0.50)

## 2019-07-01 LAB — STREP PNEUMONIAE URINARY ANTIGEN: Strep Pneumo Urinary Antigen: NEGATIVE

## 2019-07-01 LAB — FIBRINOGEN: Fibrinogen: 704 mg/dL — ABNORMAL HIGH (ref 210–475)

## 2019-07-01 LAB — GLUCOSE, CAPILLARY
Glucose-Capillary: 231 mg/dL — ABNORMAL HIGH (ref 70–99)
Glucose-Capillary: 297 mg/dL — ABNORMAL HIGH (ref 70–99)
Glucose-Capillary: 304 mg/dL — ABNORMAL HIGH (ref 70–99)
Glucose-Capillary: 221 mg/dL — ABNORMAL HIGH (ref 70–99)

## 2019-07-01 LAB — LACTATE DEHYDROGENASE: LDH: 220 U/L — ABNORMAL HIGH (ref 98–192)

## 2019-07-01 LAB — SARS CORONAVIRUS 2 BY RT PCR (HOSPITAL ORDER, PERFORMED IN ~~LOC~~ HOSPITAL LAB): SARS Coronavirus 2: POSITIVE — AB

## 2019-07-01 LAB — HEPATITIS B SURFACE ANTIGEN: Hepatitis B Surface Ag: NONREACTIVE

## 2019-07-01 LAB — TROPONIN I (HIGH SENSITIVITY): Troponin I (High Sensitivity): <2 ng/L (ref <18)

## 2019-07-01 LAB — C-REACTIVE PROTEIN: CRP: 11.9 mg/dL — ABNORMAL HIGH (ref <1.0)

## 2019-07-01 LAB — BRAIN NATRIURETIC PEPTIDE: B Natriuretic Peptide: 19.8 pg/mL (ref 0.0–100.0)

## 2019-07-01 LAB — FERRITIN: Ferritin: 172 ng/mL (ref 11–307)

## 2019-07-01 LAB — PROCALCITONIN: Procalcitonin: <0.1 ng/mL

## 2019-07-01 LAB — HIV ANTIBODY (ROUTINE TESTING W REFLEX): HIV Screen 4th Generation wRfx: NONREACTIVE

## 2019-07-01 MED ORDER — INFLUENZA VAC SPLIT QUAD 0.5 ML IM SUSY
0.5000 mL | PREFILLED_SYRINGE | INTRAMUSCULAR | Status: AC
  Administered 2019-07-02: 0.5 mL via INTRAMUSCULAR
  Filled 2019-07-01: qty 0.5

## 2019-07-01 MED ORDER — SODIUM CHLORIDE 0.9 % IV SOLN
100.0000 mg | INTRAVENOUS | Status: DC
  Administered 2019-07-02: 100 mg via INTRAVENOUS
  Filled 2019-07-01 (×2): qty 20

## 2019-07-01 MED ORDER — ADULT MULTIVITAMIN W/MINERALS CH
1.0000 | ORAL_TABLET | Freq: Every day | ORAL | Status: DC
  Administered 2019-07-02 – 2019-07-05 (×4): 1 via ORAL
  Filled 2019-07-01 (×4): qty 1

## 2019-07-01 MED ORDER — ENSURE ENLIVE PO LIQD
237.0000 mL | Freq: Two times a day (BID) | ORAL | Status: DC
  Administered 2019-07-01 – 2019-07-02 (×3): 237 mL via ORAL

## 2019-07-01 MED ORDER — SODIUM CHLORIDE 0.9 % IV SOLN
200.0000 mg | Freq: Once | INTRAVENOUS | Status: AC
  Administered 2019-07-01: 200 mg via INTRAVENOUS
  Filled 2019-07-01: qty 40

## 2019-07-01 MED ORDER — GUAIFENESIN-DM 100-10 MG/5ML PO SYRP
5.0000 mL | ORAL_SOLUTION | ORAL | Status: DC | PRN
  Administered 2019-07-01 – 2019-07-04 (×4): 5 mL via ORAL
  Filled 2019-07-01 (×4): qty 10

## 2019-07-01 MED ORDER — SODIUM CHLORIDE 0.9 % IV SOLN
INTRAVENOUS | Status: DC | PRN
  Administered 2019-07-01: 500 mL via INTRAVENOUS

## 2019-07-01 NOTE — Progress Notes (Signed)
CRITICAL VALUE ALERT  Critical Value:  Covid +   Date & Time Notied:  07/01/2019 1500  Provider Notified: Dr. Lupita Leash  Orders Received/Actions taken:

## 2019-07-01 NOTE — Progress Notes (Signed)
PROGRESS NOTE    Cheryl Patrick  ZOX:[id number] DOB: 03-05-1971 DOA: 06/30/2019 PCP: System, Pcp Not In   Brief Narrative: 48 year old female with history of anxiety, HLD, T2DM who tested + FOR  COVID-19 at [facility] who as on albuterol inh, Z pack at home presented to ER 06/30/19 night with dyspnea worse with exertion.  Patient on admission c/o of fever x 3-4 days with slight nausea, vomiting and diarrhea as well. In the ER febrile 9.5 tachycardic tachypneic up to 33 saturating 90% on room air on ambulation dropped to 89%.  Chest x-ray with patchy bilateral groundglass and interstitial opacities, relaxant WBC 6.0 hemoglobin 12.9 platelet 150 5K, creatinine 0.6, procalcitonin less than 0.1 CRP 11.9, ferritin normal 172,D dimer slightly +0.55 LDH 220, troponin less than 2 BNP 19.8,  Subjective: Seen this morning.  Resting comfortably on the bed, on room air.  Has some dyspnea with activity, also coughs with deep breath.Denies chest pain..  Assessment & Plan:  Acute hypoxic respiratory failure due to COVID-19 pneumonia: Patient Dropped to 89% with ambulation.  Currently doing well on room air 91 to 94%-Continue as needed supplemental oxygen, Cont steroid. Continue COVID-19 pneumonia treatment as below.  COVID-19 virus Pneumonia: With positive chest x-ray, mild hypoxia- procalcitonin less than 0.1 CRP 11.9, ferritin normal 172 , D dimer slightly +0.55.Received 1 dose of antibiotics in the ER and will hold off on further antibiotics as Procal neg and wbc stable.  Tachypnea improving,doing well on RA. Continue IV Decadron 6 mg daily, Remdesivir- pharmacy to dose.Monitor inflammatory markers daily.  Type 2 diabetes mellitus not on insulin: Uncontrolled hyperglycemia, monitor closely.  Continue sliding scale.  Check hemoglobin A1c, hold home Metformin.  Hyperlipidemia-not on meds  Anxiety disorder-not on meds  Nutrition: Nutrition Problem: Increased nutrient needs Etiology: acute  illness Signs/Symptoms: estimated needs Interventions: MVI, Magic cup, Hormel Shake  Body mass index is 28.88 kg/m.   DVT prophylaxis: lovenox Code Status: full Family Communication: plan of care discussed with patient at bedside. Disposition Plan: Remains inpatient pending improvement in covid [place]. Discussed with nursing staffs.  Consultants: none  Procedures: none  Microbiology: COVID 19 RT PCR: + 12/3  Antimicrobials: Anti-infectives (From admission, onward)   Start     Dose/Rate Route Frequency Ordered Stop   07/02/19 1000  remdesivir 100 mg in sodium chloride 0.9 % 250 mL IVPB     100 mg 500 mL/hr over 30 Minutes Intravenous Every 24 hours 07/01/19 0751 07/06/19 0959   07/01/19 1000  remdesivir 200 mg in sodium chloride 0.9 % 250 mL IVPB     200 mg 500 mL/hr over 30 Minutes Intravenous Once 07/01/19 0751 07/01/19 0930   06/30/19 2230  cefTRIAXone (ROCEPHIN) 1 g in sodium chloride 0.9 % 100 mL IVPB     1 g 200 mL/hr over 30 Minutes Intravenous Daily at bedtime 06/30/19 2224     06/30/19 2230  azithromycin (ZITHROMAX) 500 mg in sodium chloride 0.9 % 250 mL IVPB     500 mg 250 mL/hr over 60 Minutes Intravenous Daily at bedtime 06/30/19 2224         Objective: Vitals:   07/01/19 0600 07/01/19 0809 07/01/19 1004 07/01/19 1208  BP: 123/82 134/82 124/78 134/79  Pulse: 83 84 78 74  Resp: (!) 33 (!) 30 (!) 24 (!) 26  Temp:  97.8 F (36.6 C) 98 F (36.7 C) 97.7 F (36.5 C)  TempSrc:  Oral Oral Oral  SpO2: 92% 94% 91% 93%  Weight:  71.6 kg   Height:   5\' 2"  (1.575 m)     Intake/Output Summary (Last 24 hours) at 07/01/2019 1358 Last data filed at 07/01/2019 1200 Gross per 24 hour  Intake 764.92 ml  Output 200 ml  Net 564.92 ml   Filed Weights   07/01/19 1004  Weight: 71.6 kg   Weight change:   Body mass index is 28.88 kg/m.  Intake/Output from previous day: No intake/output data recorded. Intake/Output this shift: Total I/O In: 764.9 [P.O.:260;  I.V.:3.8; IV Piggyback:501.1] Out: 200 [Urine:200]  Examination:  General exam: AAOx3,NAD, weal. HEENT:Oral mucosa moist, Ear/Nose WNL grossly, dentition normal. Respiratory system: Diminished breath sound, mild crackles posteriorly ,no use of accessory muscle Cardiovascular system: S1 & S2 +, No JVD,. Gastrointestinal system: Abdomen soft, NT,ND, BS+ Nervous System:Alert, awake, moving extremities and grossly nonfocal Extremities: No edema, distal peripheral pulses palpable.  Skin: No rashes,no icterus. MSK: Normal muscle bulk,tone, power  Medications:  Scheduled Meds: . dexamethasone (DECADRON) injection  6 mg Intravenous QHS  . enoxaparin (LOVENOX) injection  40 mg Subcutaneous QHS  . feeding supplement (ENSURE ENLIVE)  237 mL Oral BID BM  . [START ON 07/02/2019] influenza vac split quadrivalent PF  0.5 mL Intramuscular Tomorrow-1000  . insulin aspart  0-5 Units Subcutaneous QHS  . insulin aspart  0-9 Units Subcutaneous TID WC  . [START ON 07/02/2019] multivitamin with minerals  1 tablet Oral Daily   Continuous Infusions: . sodium chloride 500 mL (07/01/19 0859)  . azithromycin Stopped (07/01/19 0041)  . cefTRIAXone (ROCEPHIN)  IV Stopped (06/30/19 2359)  . [START ON 07/02/2019] remdesivir 100 mg in NS 250 mL      Data Reviewed: I have personally reviewed following labs and imaging studies  CBC: Recent Labs  Lab 06/30/19 1738 07/01/19 0303  WBC 6.0 3.4*  NEUTROABS 4.5 2.9  HGB 12.8 13.0  HCT 40.3 41.0  MCV 84.0 83.0  PLT 165 178   Basic Metabolic Panel: Recent Labs  Lab 06/30/19 1738 07/01/19 0303  NA 136 136  K 3.5 3.6  CL 100 98  CO2 26 22  GLUCOSE 169* 211*  BUN 10 10  CREATININE 0.66 0.47  CALCIUM 8.4* 8.7*   GFR: Estimated Creatinine Clearance: 80.6 mL/min (by C-G formula based on SCr of 0.47 mg/dL). Liver Function Tests: Recent Labs  Lab 06/30/19 1738 07/01/19 0303  AST 45* 40  ALT 36 36  ALKPHOS 60 59  BILITOT 0.5 0.4  PROT 7.4 7.6   ALBUMIN 3.5 3.5   No results for input(s): LIPASE, AMYLASE in the last 168 hours. No results for input(s): AMMONIA in the last 168 hours. Coagulation Profile: No results for input(s): INR, PROTIME in the last 168 hours. Cardiac Enzymes: No results for input(s): CKTOTAL, CKMB, CKMBINDEX, TROPONINI in the last 168 hours. BNP (last 3 results) No results for input(s): PROBNP in the last 8760 hours. HbA1C: No results for input(s): HGBA1C in the last 72 hours. CBG: Recent Labs  Lab 06/30/19 2257 07/01/19 0810 07/01/19 1205  GLUCAP 230* 231* 297*   Lipid Profile: No results for input(s): CHOL, HDL, LDLCALC, TRIG, CHOLHDL, LDLDIRECT in the last 72 hours. Thyroid Function Tests: No results for input(s): TSH, T4TOTAL, FREET4, T3FREE, THYROIDAB in the last 72 hours. Anemia Panel: Recent Labs    06/30/19 2223  FERRITIN 172   Sepsis Labs: Recent Labs  Lab 06/30/19 2223  PROCALCITON <0.10    Recent Results (from the past 240 hour(s))  SARS Coronavirus 2 by RT PCR (hospital  order, performed in [facility] hospital lab) Nasopharyngeal Nasopharyngeal Swab     Status: Abnormal   Collection Time: 07/01/19  9:03 AM   Specimen: Nasopharyngeal Swab  Result Value Ref Range Status   SARS Coronavirus 2 POSITIVE (A) NEGATIVE Final    Comment: (NOTE) SARS-CoV-2 target nucleic acids are DETECTED SARS-CoV-2 RNA is generally detectable in upper respiratory specimens  during the acute phase of infection.  Positive results are indicative  of the presence of the identified virus, but do not rule out bacterial infection or co-infection with other pathogens not detected by the test.  Clinical correlation with patient history and  other diagnostic information is necessary to determine patient infection status.  The expected result is negative. Fact Sheet for Patients:   StrictlyIdeas.no  Fact Sheet for Healthcare Providers:   BankingDealers.co.za    This test is not yet approved or cleared by the [place] FDA and  has been authorized for detection and/or diagnosis of SARS-CoV-2 by FDA under an Emergency Use Authorization (EUA).  This EUA will remain in effect (meaning this test can be used) for the duration of  the COVID-19 declaration under Section 564(b)(1) of the A ct, 21 U.S.C. section 360-bbb-3(b)(1), unless the authorization is terminated or revoked sooner. Performed at [facility], [place] [address] [place], [place] [address]       Radiology Studies: Dg Chest Port 1 View  Result Date: 06/30/2019 CLINICAL DATA:  Short of breath COVID positive EXAM: PORTABLE CHEST 1 VIEW COMPARISON:  10/23/2013 FINDINGS: Patchy mostly peripheral and basilar ground-glass opacities. No pleural effusion. Normal heart size. No pneumothorax. IMPRESSION: Patchy bilateral ground-glass and interstitial opacities, felt consistent with multifocal pneumonia and history of COVID positivity. Electronically Signed   By: Donavan Foil M.D.   On: 06/30/2019 18:11      LOS: 1 day   Time spent: More than 50% of that time was spent in counseling and/or coordination of care.  Antonieta Pert, MD [facility]  07/01/2019, 1:58 PM

## 2019-07-01 NOTE — Progress Notes (Signed)
Initial Nutrition Assessment  RD working remotely.   DOCUMENTATION CODES:   Not applicable  INTERVENTION:  - will order Hormel Shake with breakfast meals, each supplement provides 500 kcal and 22 grams protein. - will order Magic Cup BID with meals, each supplement provides 290 kcal and 9 grams of protein. - will order daily multivitamin with minerals.    NUTRITION DIAGNOSIS:   Increased nutrient needs related to acute illness as evidenced by estimated needs.  GOAL:   Patient will meet greater than or equal to 90% of their needs  MONITOR:   PO intake, Supplement acceptance, Labs, Weight trends  REASON FOR ASSESSMENT:   Malnutrition Screening Tool  ASSESSMENT:   48 y.o. female with medical history of anxiety, hyperlipidemia, type 2 DM, COVID-19 positive at [facility] and treated with Z-pak and albuterol. She presented to the ED with dyspnea, worse with exertion. She had a fever for 3-4 days PTA, cough with white sputum, slight nausea, and diarrhea.  Chart indicates that patient is Spanish-speaking and requires an interpretor. Per flow sheet documentation, she consumed 50% of breakfast this AM. Per chart review, current weight is 158 lb. Most recently documented weight PTA was on 06/12/17 when she weighed 171 lb. This indicates 13 lb weight loss (7.6% body weight) in the past 2 years; not significant for time frame but unsure if weight loss has occurred more acutely.   Per notes: - COVID-19 positive - CAP   Labs reviewed; CBG: 231 mg/dl, Ca: 8.7 mg/dl. Medications reviewed; sliding scale novolog, 125 mcg solu-medrol x1 dose 12/2, 100 mg remdesivir/day x4 days starting 12/4, 200 mg remdesivir x1 dose 12/3.     NUTRITION - FOCUSED PHYSICAL EXAM:  unable to complete for COVID-19 positive patient  Diet Order:   Diet Order            Diet heart healthy/carb modified Room service appropriate? Yes; Fluid consistency: Thin  Diet effective now               EDUCATION NEEDS:   No education needs have been identified at this time  Skin:  Skin Assessment: Reviewed RN Assessment  Last BM:  PTA/unknown  Height:   Ht Readings from Last 1 Encounters:  07/01/19 5\' 2"  (1.575 m)    Weight:   Wt Readings from Last 1 Encounters:  07/01/19 71.6 kg    Ideal Body Weight:  50 kg  BMI:  Body mass index is 28.88 kg/m.  Estimated Nutritional Needs:   Kcal:  1900-2100 kcal  Protein:  95-110 grams  Fluid:  >/= 2 L/day     Jarome Matin, MS, RD, LDN, [facility] Inpatient Clinical Dietitian Pager # [phone] After hours/weekend pager # [phone]

## 2019-07-01 NOTE — Progress Notes (Signed)
Responded to spiritual care consult. I talked with Nurse and she said that she was able to drop off an AD (in [place]) with Cheryl Patrick as she cared for her. I was able to call her and offer spiritual care with words of comfort, and offered her space to present some concerns she's had. She said that she is feeling better today and a little more at peace. She said that her mother who lived in [place] has recently passed away this passed 11-25-2022 and she felt a little down, but she said shes doing better with that. Also, she mentioned that she was concerned about her daughter for a bit, but feels ok now, since her daughter was recently discharged and that her daughter is feeling better. Chaplain available as needed.   Chaplain Resident  Fidel Levy [phone]

## 2019-07-01 NOTE — ED Notes (Signed)
Report given to Jessica RN

## 2019-07-02 LAB — COMPREHENSIVE METABOLIC PANEL
ALT: 35 U/L (ref 0–44)
AST: 28 U/L (ref 15–41)
Albumin: 3.1 g/dL — ABNORMAL LOW (ref 3.5–5.0)
Alkaline Phosphatase: 53 U/L (ref 38–126)
Anion gap: 14 (ref 5–15)
BUN: 22 mg/dL — ABNORMAL HIGH (ref 6–20)
CO2: 20 mmol/L — ABNORMAL LOW (ref 22–32)
Calcium: 8.5 mg/dL — ABNORMAL LOW (ref 8.9–10.3)
Chloride: 106 mmol/L (ref 98–111)
Creatinine, Ser: 0.62 mg/dL (ref 0.44–1.00)
GFR calc Af Amer: >60 mL/min (ref >60)
GFR calc non Af Amer: >60 mL/min (ref >60)
Glucose, Bld: 237 mg/dL — ABNORMAL HIGH (ref 70–99)
Potassium: 4 mmol/L (ref 3.5–5.1)
Sodium: 140 mmol/L (ref 135–145)
Total Bilirubin: 0.8 mg/dL (ref 0.3–1.2)
Total Protein: 6.4 g/dL — ABNORMAL LOW (ref 6.5–8.1)

## 2019-07-02 LAB — CBC WITH DIFFERENTIAL/PLATELET
Abs Immature Granulocytes: 0.05 10*3/uL (ref 0.00–0.07)
Basophils Absolute: 0 10*3/uL (ref 0.0–0.1)
Basophils Relative: 0 %
Eosinophils Absolute: 0 10*3/uL (ref 0.0–0.5)
Eosinophils Relative: 0 %
HCT: 37.1 % (ref 36.0–46.0)
Hemoglobin: 11.8 g/dL — ABNORMAL LOW (ref 12.0–15.0)
Immature Granulocytes: 1 %
Lymphocytes Relative: 11 %
Lymphs Abs: 0.8 10*3/uL (ref 0.7–4.0)
MCH: 26.8 pg (ref 26.0–34.0)
MCHC: 31.8 g/dL (ref 30.0–36.0)
MCV: 84.3 fL (ref 80.0–100.0)
Monocytes Absolute: 0.4 10*3/uL (ref 0.1–1.0)
Monocytes Relative: 5 %
Neutro Abs: 5.9 10*3/uL (ref 1.7–7.7)
Neutrophils Relative %: 83 %
Platelets: 214 10*3/uL (ref 150–400)
RBC: 4.4 MIL/uL (ref 3.87–5.11)
RDW: 13 % (ref 11.5–15.5)
WBC: 7.2 10*3/uL (ref 4.0–10.5)
nRBC: 0 % (ref 0.0–0.2)

## 2019-07-02 LAB — C-REACTIVE PROTEIN: CRP: 5.3 mg/dL — ABNORMAL HIGH (ref <1.0)

## 2019-07-02 LAB — D-DIMER, QUANTITATIVE: D-Dimer, Quant: 0.42 ug/mL-FEU (ref 0.00–0.50)

## 2019-07-02 LAB — LEGIONELLA PNEUMOPHILA SEROGP 1 UR AG: L. pneumophila Serogp 1 Ur Ag: NEGATIVE

## 2019-07-02 LAB — HEMOGLOBIN A1C
Hgb A1c MFr Bld: 7 % — ABNORMAL HIGH (ref 4.8–5.6)
Mean Plasma Glucose: 154.2 mg/dL

## 2019-07-02 LAB — GLUCOSE, CAPILLARY
Glucose-Capillary: 275 mg/dL — ABNORMAL HIGH (ref 70–99)
Glucose-Capillary: 311 mg/dL — ABNORMAL HIGH (ref 70–99)
Glucose-Capillary: 271 mg/dL — ABNORMAL HIGH (ref 70–99)
Glucose-Capillary: 250 mg/dL — ABNORMAL HIGH (ref 70–99)

## 2019-07-02 LAB — FERRITIN: Ferritin: 142 ng/mL (ref 11–307)

## 2019-07-02 MED ORDER — LINAGLIPTIN 5 MG PO TABS
5.0000 mg | ORAL_TABLET | Freq: Every day | ORAL | Status: DC
  Administered 2019-07-02 – 2019-07-05 (×4): 5 mg via ORAL
  Filled 2019-07-02 (×4): qty 1

## 2019-07-02 NOTE — Progress Notes (Signed)
PROGRESS NOTE    Cheryl Patrick  ZOX:[id number] DOB: 24-Feb-1971 DOA: 06/30/2019 PCP: System, Pcp Not In   Brief Narrative: 48 year old female with history of anxiety, HLD, T2DM who tested + FOR  COVID-19 at [facility] who as on albuterol inh, Z pack at home presented to ER 06/30/19 night with dyspnea worse with exertion.  Patient on admission c/o of fever x 3-4 days with slight nausea, vomiting and diarrhea as well. In the ER febrile 9.5 tachycardic tachypneic up to 33 saturating 90% on room air on ambulation dropped to 89%.  Chest x-ray with patchy bilateral groundglass and interstitial opacities, WBC 6.0 hemoglobin 12.9 platelet 150 5K, creatinine 0.6, procalcitonin less than 0.1 CRP 11.9, ferritin normal 172,D dimer slightly +0.55 LDH 220, troponin less than 2 BNP 19.8, Patient admitted and being treated for COVID-19 pneumonia and hypoxic respiratory failure  Subjective:  Seen this morning.  Patient reports she is feeling much better today, mask makes her cough more.   Breathing in ower 20s this morning. On 2 L nasal cannula-saturatin 95%  Assessment & Plan:  Acute hypoxic respiratory failure due to COVID-19 pneumonia: Patient Dropped to 89% with ambulation 12/3. This morning on 2 L nasal cannula. Continue supplemental oxygen, steroid. Continue COVID-19 pneumonia treatment as below.  COVID-19 virus Pneumonia: had positive chest x-ray, hypoxia- procalcitonin less than 0.1. inflmmatory markers improving: CRP 11.9-->5.3, ferritin normal 172 , D dimer slightly +0.55-->0.4 (nl). stop abx. Tachypnea 20-30s, this am  RR in 24 on my exam.Continue IV Decadron 6 mg daily, Remdesivir- pharmacy to dose.Monitor inflammatory markers daily.  Type 2 diabetes mellitus not on insulin: Uncontrolled hyperglycemia with decadron use. Continue sliding scale.hemoglobin A1c- 7.0, hold home meds, add linagliptin 5 mg for glycemic control and possible mortality benefit with COVID-19.  Metabolic acidosis  with bicarb 20, monitor.  Hyperlipidemia-not on meds  Anxiety disorder-not on meds.  Mood is a stable.  Nutrition: Nutrition Problem: Increased nutrient needs Etiology: acute illness Signs/Symptoms: estimated needs Interventions: MVI, Magic cup, Hormel Shake  Body mass index is 28.88 kg/m.   DVT prophylaxis: lovenox Code Status: full Family Communication: plan of care discussed with patient at bedside. Disposition Plan: Remains inpatient pending improvement in covid [place]. Discussed with nursing staffs.  Transfer to [facility] if bed available  Consultants: none  Procedures: none  Microbiology: COVID 19 RT PCR: + 12/3  Antimicrobials: Anti-infectives (From admission, onward)   Start     Dose/Rate Route Frequency Ordered Stop   07/02/19 1000  remdesivir 100 mg in sodium chloride 0.9 % 250 mL IVPB     100 mg 500 mL/hr over 30 Minutes Intravenous Every 24 hours 07/01/19 0751 07/06/19 0959   07/01/19 1000  remdesivir 200 mg in sodium chloride 0.9 % 250 mL IVPB     200 mg 500 mL/hr over 30 Minutes Intravenous Once 07/01/19 0751 07/01/19 0930   06/30/19 2230  cefTRIAXone (ROCEPHIN) 1 g in sodium chloride 0.9 % 100 mL IVPB  Status:  Discontinued     1 g 200 mL/hr over 30 Minutes Intravenous Daily at bedtime 06/30/19 2224 07/02/19 1138   06/30/19 2230  azithromycin (ZITHROMAX) 500 mg in sodium chloride 0.9 % 250 mL IVPB  Status:  Discontinued     500 mg 250 mL/hr over 60 Minutes Intravenous Daily at bedtime 06/30/19 2224 07/02/19 1138       Objective: Vitals:   07/01/19 1622 07/01/19 2010 07/02/19 0106 07/02/19 0455  BP: 119/67 116/79 118/68 111/73  Pulse: 89 90 87  74  Resp: (!) 22 (!) 28 (!) 36 (!) 34  Temp: 98.6 F (37 C) 98.8 F (37.1 C) 98.1 F (36.7 C) 98.2 F (36.8 C)  TempSrc: Oral Oral Oral Oral  SpO2: 94% 96% 90% 95%  Weight:      Height:        Intake/Output Summary (Last 24 hours) at 07/02/2019 1138 Last data filed at 07/02/2019 [id number] Gross per 24 hour   Intake 612.25 ml  Output 400 ml  Net 212.25 ml   Filed Weights   07/01/19 1004  Weight: 71.6 kg   Weight change:   Body mass index is 28.88 kg/m.  Intake/Output from previous day: 12/03 0701 - 12/04 0700 In: 1177.2 [P.O.:320; I.V.:6; IV Piggyback:851.1] Out: 500 [Urine:500] Intake/Output this shift: No intake/output data recorded.  Examination:  General exam: Alert awake oriented x3, not in acute distress, obese. HEENT:Oral mucosa moist, Ear/Nose WNL grossly, dentition normal. Respiratory system: Bilateral clear breath sounds present no wheezing ,no use of accessory muscle Cardiovascular system: S1 & S2 +, No JVD,. Gastrointestinal system: Abdomen soft, NT,ND, BS+ Nervous System:Alert, awake, moving extremities and grossly nonfocal Extremities: No edema, distal peripheral pulses palpable.  Skin: No rashes,no icterus. MSK: Normal muscle bulk,tone, power  Medications:  Scheduled Meds: . dexamethasone (DECADRON) injection  6 mg Intravenous QHS  . enoxaparin (LOVENOX) injection  40 mg Subcutaneous QHS  . feeding supplement (ENSURE ENLIVE)  237 mL Oral BID BM  . insulin aspart  0-5 Units Subcutaneous QHS  . insulin aspart  0-9 Units Subcutaneous TID WC  . linagliptin  5 mg Oral Daily  . multivitamin with minerals  1 tablet Oral Daily   Continuous Infusions: . sodium chloride Stopped (07/01/19 2217)  . remdesivir 100 mg in NS 100 mL 100 mg (07/02/19 1031)    Data Reviewed: I have personally reviewed following labs and imaging studies  CBC: Recent Labs  Lab 06/30/19 1738 07/01/19 0303 07/02/19 0420  WBC 6.0 3.4* 7.2  NEUTROABS 4.5 2.9 5.9  HGB 12.8 13.0 11.8*  HCT 40.3 41.0 37.1  MCV 84.0 83.0 84.3  PLT 165 178 214   Basic Metabolic Panel: Recent Labs  Lab 06/30/19 1738 07/01/19 0303 07/02/19 0420  NA 136 136 140  K 3.5 3.6 4.0  CL 100 98 106  CO2 26 22 20*  GLUCOSE 169* 211* 237*  BUN 10 10 22*  CREATININE 0.66 0.47 0.62  CALCIUM 8.4* 8.7* 8.5*    GFR: Estimated Creatinine Clearance: 80.6 mL/min (by C-G formula based on SCr of 0.62 mg/dL). Liver Function Tests: Recent Labs  Lab 06/30/19 1738 07/01/19 0303 07/02/19 0420  AST 45* 40 28  ALT 36 36 35  ALKPHOS 60 59 53  BILITOT 0.5 0.4 0.8  PROT 7.4 7.6 6.4*  ALBUMIN 3.5 3.5 3.1*   No results for input(s): LIPASE, AMYLASE in the last 168 hours. No results for input(s): AMMONIA in the last 168 hours. Coagulation Profile: No results for input(s): INR, PROTIME in the last 168 hours. Cardiac Enzymes: No results for input(s): CKTOTAL, CKMB, CKMBINDEX, TROPONINI in the last 168 hours. BNP (last 3 results) No results for input(s): PROBNP in the last 8760 hours. HbA1C: Recent Labs    07/02/19 0420  HGBA1C 7.0*   CBG: Recent Labs  Lab 07/01/19 0810 07/01/19 1205 07/01/19 1615 07/01/19 2007 07/02/19 0909  GLUCAP 231* 297* 304* 221* 275*   Lipid Profile: No results for input(s): CHOL, HDL, LDLCALC, TRIG, CHOLHDL, LDLDIRECT in the last 72 hours. Thyroid  Function Tests: No results for input(s): TSH, T4TOTAL, FREET4, T3FREE, THYROIDAB in the last 72 hours. Anemia Panel: Recent Labs    06/30/19 2223 07/02/19 0420  FERRITIN 172 142   Sepsis Labs: Recent Labs  Lab 06/30/19 2223  PROCALCITON <0.10    Recent Results (from the past 240 hour(s))  SARS CORONAVIRUS 2 (TAT 6-24 HRS) Nasopharyngeal Nasopharyngeal Swab     Status: Abnormal   Collection Time: 07/01/19  1:42 AM   Specimen: Nasopharyngeal Swab  Result Value Ref Range Status   SARS Coronavirus 2 POSITIVE (A) NEGATIVE Final    Comment: RESULT CALLED TO, READ BACK BY AND VERIFIED WITH: Elby Showers RN 15:00 07/01/19 ([email or web address]) (NOTE) SARS-CoV-2 target nucleic acids are DETECTED. The SARS-CoV-2 RNA is generally detectable in upper and lower respiratory specimens during the acute phase of infection. Positive results are indicative of the presence of SARS-CoV-2 RNA. Clinical correlation with patient history and other  diagnostic information is  necessary to determine patient infection status. Positive results do not rule out bacterial infection or co-infection with other viruses.  The expected result is Negative. Fact Sheet for Patients: HairSlick.no Fact Sheet for Healthcare Providers: quierodirigir.com This test is not yet approved or cleared by the [place] FDA and  has been authorized for detection and/or diagnosis of SARS-CoV-2 by FDA under an Emergency Use Authorization (EUA). This EUA will remain  in effect (meaning this test can be used) for the  duration of the COVID-19 declaration under Section 564(b)(1) of the Act, 21 U.S.C. section 360bbb-3(b)(1), unless the authorization is terminated or revoked sooner. Performed at [facility], [address], [place], [place] [address]   SARS Coronavirus 2 by RT PCR (hospital order, performed in [facility] hospital lab) Nasopharyngeal Nasopharyngeal Swab     Status: Abnormal   Collection Time: 07/01/19  9:03 AM   Specimen: Nasopharyngeal Swab  Result Value Ref Range Status   SARS Coronavirus 2 POSITIVE (A) NEGATIVE Final    Comment: (NOTE) SARS-CoV-2 target nucleic acids are DETECTED SARS-CoV-2 RNA is generally detectable in upper respiratory specimens  during the acute phase of infection.  Positive results are indicative  of the presence of the identified virus, but do not rule out bacterial infection or co-infection with other pathogens not detected by the test.  Clinical correlation with patient history and  other diagnostic information is necessary to determine patient infection status.  The expected result is negative. Fact Sheet for Patients:   BoilerBrush.com.cy  Fact Sheet for Healthcare Providers:   https://pope.com/   This test is not yet approved or cleared by the [place] FDA and  has been authorized for detection  and/or diagnosis of SARS-CoV-2 by FDA under an Emergency Use Authorization (EUA).  This EUA will remain in effect (meaning this test can be used) for the duration of  the COVID-19 declaration under Section 564(b)(1) of the A ct, 21 U.S.C. section 360-bbb-3(b)(1), unless the authorization is terminated or revoked sooner. Performed at [facility], [address] [place], [place] [address]       Radiology Studies: Dg Chest Port 1 View  Result Date: 06/30/2019 CLINICAL DATA:  Short of breath COVID positive EXAM: PORTABLE CHEST 1 VIEW COMPARISON:  10/23/2013 FINDINGS: Patchy mostly peripheral and basilar ground-glass opacities. No pleural effusion. Normal heart size. No pneumothorax. IMPRESSION: Patchy bilateral ground-glass and interstitial opacities, felt consistent with multifocal pneumonia and history of COVID positivity. Electronically Signed   By: Jasmine Pang M.D.   On:  06/30/2019 18:11      LOS: 2 days   Time spent: More than 50% of that time was spent in counseling and/or coordination of care.  Antonieta Pert, MD [facility]  07/02/2019, 11:38 AM

## 2019-07-02 NOTE — Progress Notes (Signed)
Inpatient Diabetes Program Recommendations  AACE/ADA: New Consensus Statement on Inpatient Glycemic Control (2015)  Target Ranges:  Prepandial:   less than 140 mg/dL      Peak postprandial:   less than 180 mg/dL (1-2 hours)      Critically ill patients:  140 - 180 mg/dL   Lab Results  Component Value Date   GLUCAP 271 (H) 07/02/2019   HGBA1C 7.0 (H) 07/02/2019    Review of Glycemic Control  Diabetes history: DM2 Outpatient Diabetes medications: metformin 500 mg bid Current orders for Inpatient glycemic control: Novolog 0-9 units tidwc and 0-5 units QHS, tradjenta 5 mg QD  HgbA1C - 7% On Decadron 6 mg QHS  Inpatient Diabetes Program Recommendations:     Lantus 12 units QHS Increase Novolog to 0-15 units tidwc and 0-5 units QHS Add meal coverage - Novolog 4 units tidwc  Will follow.  Thank you. Lorenda Peck, RD, LDN, CDE Inpatient Diabetes Coordinator [phone]

## 2019-07-03 LAB — FERRITIN: Ferritin: 124 ng/mL (ref 11–307)

## 2019-07-03 LAB — COMPREHENSIVE METABOLIC PANEL
ALT: 28 U/L (ref 0–44)
AST: 19 U/L (ref 15–41)
Albumin: 2.9 g/dL — ABNORMAL LOW (ref 3.5–5.0)
Alkaline Phosphatase: 50 U/L (ref 38–126)
Anion gap: 11 (ref 5–15)
BUN: 24 mg/dL — ABNORMAL HIGH (ref 6–20)
CO2: 24 mmol/L (ref 22–32)
Calcium: 8.5 mg/dL — ABNORMAL LOW (ref 8.9–10.3)
Chloride: 106 mmol/L (ref 98–111)
Creatinine, Ser: 0.57 mg/dL (ref 0.44–1.00)
GFR calc Af Amer: >60 mL/min (ref >60)
GFR calc non Af Amer: >60 mL/min (ref >60)
Glucose, Bld: 264 mg/dL — ABNORMAL HIGH (ref 70–99)
Potassium: 4.2 mmol/L (ref 3.5–5.1)
Sodium: 141 mmol/L (ref 135–145)
Total Bilirubin: 0.2 mg/dL — ABNORMAL LOW (ref 0.3–1.2)
Total Protein: 6.2 g/dL — ABNORMAL LOW (ref 6.5–8.1)

## 2019-07-03 LAB — CBC WITH DIFFERENTIAL/PLATELET
Abs Immature Granulocytes: 0.05 10*3/uL (ref 0.00–0.07)
Basophils Absolute: 0 10*3/uL (ref 0.0–0.1)
Basophils Relative: 0 %
Eosinophils Absolute: 0 10*3/uL (ref 0.0–0.5)
Eosinophils Relative: 0 %
HCT: 36.8 % (ref 36.0–46.0)
Hemoglobin: 11.6 g/dL — ABNORMAL LOW (ref 12.0–15.0)
Immature Granulocytes: 1 %
Lymphocytes Relative: 11 %
Lymphs Abs: 0.9 10*3/uL (ref 0.7–4.0)
MCH: 26.9 pg (ref 26.0–34.0)
MCHC: 31.5 g/dL (ref 30.0–36.0)
MCV: 85.4 fL (ref 80.0–100.0)
Monocytes Absolute: 0.3 10*3/uL (ref 0.1–1.0)
Monocytes Relative: 4 %
Neutro Abs: 6.3 10*3/uL (ref 1.7–7.7)
Neutrophils Relative %: 84 %
Platelets: 236 10*3/uL (ref 150–400)
RBC: 4.31 MIL/uL (ref 3.87–5.11)
RDW: 13.1 % (ref 11.5–15.5)
WBC: 7.6 10*3/uL (ref 4.0–10.5)
nRBC: 0 % (ref 0.0–0.2)

## 2019-07-03 LAB — GLUCOSE, CAPILLARY
Glucose-Capillary: 239 mg/dL — ABNORMAL HIGH (ref 70–99)
Glucose-Capillary: 330 mg/dL — ABNORMAL HIGH (ref 70–99)
Glucose-Capillary: 157 mg/dL — ABNORMAL HIGH (ref 70–99)
Glucose-Capillary: 286 mg/dL — ABNORMAL HIGH (ref 70–99)

## 2019-07-03 LAB — C-REACTIVE PROTEIN: CRP: 2 mg/dL — ABNORMAL HIGH (ref <1.0)

## 2019-07-03 LAB — D-DIMER, QUANTITATIVE: D-Dimer, Quant: 0.33 ug/mL-FEU (ref 0.00–0.50)

## 2019-07-03 MED ORDER — SODIUM CHLORIDE 0.9 % IV SOLN
100.0000 mg | Freq: Every day | INTRAVENOUS | Status: AC
  Administered 2019-07-03 – 2019-07-05 (×3): 100 mg via INTRAVENOUS
  Filled 2019-07-03 (×3): qty 20
  Filled 2019-07-03: qty 100

## 2019-07-03 MED ORDER — INSULIN ASPART 100 UNIT/ML ~~LOC~~ SOLN
4.0000 [IU] | Freq: Three times a day (TID) | SUBCUTANEOUS | Status: DC
  Administered 2019-07-03 – 2019-07-04 (×3): 4 [IU] via SUBCUTANEOUS

## 2019-07-03 MED ORDER — INSULIN GLARGINE 100 UNIT/ML ~~LOC~~ SOLN
10.0000 [IU] | Freq: Every day | SUBCUTANEOUS | Status: DC
  Administered 2019-07-03 – 2019-07-04 (×2): 10 [IU] via SUBCUTANEOUS
  Filled 2019-07-03 (×3): qty 0.1

## 2019-07-03 MED ORDER — ALBUTEROL SULFATE HFA 108 (90 BASE) MCG/ACT IN AERS
2.0000 | INHALATION_SPRAY | Freq: Four times a day (QID) | RESPIRATORY_TRACT | Status: DC | PRN
  Filled 2019-07-03: qty 6.7

## 2019-07-03 MED ORDER — GLUCERNA SHAKE PO LIQD
237.0000 mL | Freq: Three times a day (TID) | ORAL | Status: DC
  Administered 2019-07-03 – 2019-07-04 (×2): 237 mL via ORAL
  Filled 2019-07-03 (×8): qty 237

## 2019-07-03 NOTE — Progress Notes (Signed)
Pt transferred from [facility] this afternoon. A/Ox4. Pt notified family of transfer, no questions or concerns. VSS Oriented to room & call bell. Pt received dinner tray.

## 2019-07-03 NOTE — Progress Notes (Signed)
PROGRESS NOTE    Cheryl Patrick  FTD:[id number] DOB: 10/07/1970 DOA: 06/30/2019 PCP: System, Pcp Not In   Brief Narrative: 48 year old female with history of anxiety, HLD, T2DM who tested + FOR  COVID-19 at [facility] who as on albuterol inh, Z pack at home presented to ER 06/30/19 night with dyspnea worse with exertion.  Patient on admission c/o of fever x 3-4 days with slight nausea, vomiting and diarrhea as well. In the ER febrile 9.5 tachycardic tachypneic up to 33 saturating 90% on room air on ambulation dropped to 89%.  Chest x-ray with patchy bilateral groundglass and interstitial opacities, WBC 6.0 hemoglobin 12.9 platelet 150 5K, creatinine 0.6, procalcitonin less than 0.1 CRP 11.9, ferritin normal 172,D dimer slightly +0.55 LDH 220, troponin less than 2 BNP 19.8, Patient was admitted and is being treated for COVID-19 pneumonia and hypoxic respiratory failure  Subjective:  No acute events overnight.  Patient reports he feels overall improving, still has cough that worsens with deep breath.  Tachypnea has improved, remains on 12 nasal cannula. Not using mask as she states it makes her cough more.    Assessment & Plan:  Acute hypoxic respiratory failure due to COVID-19 pneumonia: Continue to nasal cannula, continue supplemental oxygen steroids and COVID pneumonia treatment as below.    COVID-19 virus Pneumonia: with inifiltrates on chest x-ray, and hypoxia- procalcitonin less than 0.1. Clinically improving.  Tachypnea is better. Inflmmatory markers improving: CRP 11.9-->5.3->2, ferritin normal 124 , D dimer slightly +0.55-->0.4_ 0.33 (nl). Off abx. We will continue IV Decadron 6 mg daily, Remdesivir- pharmacy to dose and monitor inflammatory markers daily.  Type 2 diabetes mellitus not on insulin: Uncontrolled hyperglycemia, also with decadron use. hemoglobin A1c- 7.0.Added Lantus 10 units daily, 4 units Premeal NovoLog,  Cont linagliptin 5 mg (addded 12/2) for glycemic control  and possible mortality benefit with COVID-19. Also cont ssi and monitor.  Metabolic acidosis with bicarb 20- improved to 24.   Hyperlipidemia-not on meds  Anxiety disorder-not on meds.  Mood is a stable.  Nutrition: Nutrition Problem: Increased nutrient needs Etiology: acute illness Signs/Symptoms: estimated needs Interventions: MVI, Magic cup, Hormel Shake  Body mass index is 28.88 kg/m.   DVT prophylaxis: lovenox Code Status: full Family Communication: plan of care discussed with patient at bedside. Disposition Plan: Remains inpatient pending improvement in covid [place]. Discussed with nursing staffs.  Transfer to [facility] if bed available  Consultants: none Procedures: none Microbiology: COVID 19 RT PCR: + 12/3  Antimicrobials: Anti-infectives (From admission, onward)   Start     Dose/Rate Route Frequency Ordered Stop   07/03/19 1000  remdesivir 100 mg in sodium chloride 0.9 % 100 mL IVPB     100 mg 200 mL/hr over 30 Minutes Intravenous Daily 07/03/19 0656 07/06/19 0959   07/02/19 1000  remdesivir 100 mg in sodium chloride 0.9 % 250 mL IVPB  Status:  Discontinued     100 mg 500 mL/hr over 30 Minutes Intravenous Every 24 hours 07/01/19 0751 07/03/19 0656   07/01/19 1000  remdesivir 200 mg in sodium chloride 0.9 % 250 mL IVPB     200 mg 500 mL/hr over 30 Minutes Intravenous Once 07/01/19 0751 07/01/19 0930   06/30/19 2230  cefTRIAXone (ROCEPHIN) 1 g in sodium chloride 0.9 % 100 mL IVPB  Status:  Discontinued     1 g 200 mL/hr over 30 Minutes Intravenous Daily at bedtime 06/30/19 2224 07/02/19 1138   06/30/19 2230  azithromycin (ZITHROMAX) 500 mg in sodium chloride  0.9 % 250 mL IVPB  Status:  Discontinued     500 mg 250 mL/hr over 60 Minutes Intravenous Daily at bedtime 06/30/19 2224 07/02/19 1138       Objective: Vitals:   07/02/19 1147 07/02/19 1211 07/02/19 2118 07/03/19 0644  BP:  121/81 117/74 124/72  Pulse:  74 85 63  Resp: (!) 24 (!) 31  20  Temp:  98 F  (36.7 C) 98.1 F (36.7 C) 98.4 F (36.9 C)  TempSrc:  Oral Oral Oral  SpO2:  96% 98% 95%  Weight:      Height:        Intake/Output Summary (Last 24 hours) at 07/03/2019 1135 Last data filed at 07/02/2019 1600 Gross per 24 hour  Intake 380 ml  Output -  Net 380 ml   Filed Weights   07/01/19 1004  Weight: 71.6 kg   Weight change:   Body mass index is 28.88 kg/m.  Intake/Output from previous day: 12/04 0701 - 12/05 0700 In: 1100 [P.O.:840; IV Piggyback:260] Out: -  Intake/Output this shift: No intake/output data recorded.  Examination:  General exam: Alert and awake,, NAD, on [place] 02 HEENT:Oral mucosa moist, Ear/Nose WNL grossly, dentition normal. Respiratory system: Bilateral clear breath sounds- but has scattered wheezing with deep cough,no use of accessory muscle Cardiovascular system: S1 & S2 +, No JVD,. Gastrointestinal system: Abdomen soft, NT,ND, BS+ Nervous System:Alert, awake, oriented x3, moving all extremities moving extremities and grossly nonfocal Extremities: No edema, distal peripheral pulses palpable.  Skin: No rashes,no icterus. MSK: Normal muscle bulk,tone, power  Medications:  Scheduled Meds: . dexamethasone (DECADRON) injection  6 mg Intravenous QHS  . enoxaparin (LOVENOX) injection  40 mg Subcutaneous QHS  . feeding supplement (GLUCERNA SHAKE)  237 mL Oral TID BM  . insulin aspart  0-5 Units Subcutaneous QHS  . insulin aspart  0-9 Units Subcutaneous TID WC  . insulin aspart  4 Units Subcutaneous TID WC  . insulin glargine  10 Units Subcutaneous Daily  . linagliptin  5 mg Oral Daily  . multivitamin with minerals  1 tablet Oral Daily   Continuous Infusions: . sodium chloride Stopped (07/01/19 2217)  . remdesivir 100 mg in NS 100 mL 100 mg (07/03/19 1024)    Data Reviewed: I have personally reviewed following labs and imaging studies  CBC: Recent Labs  Lab 06/30/19 1738 07/01/19 0303 07/02/19 0420 07/03/19 0343  WBC 6.0 3.4* 7.2 7.6   NEUTROABS 4.5 2.9 5.9 6.3  HGB 12.8 13.0 11.8* 11.6*  HCT 40.3 41.0 37.1 36.8  MCV 84.0 83.0 84.3 85.4  PLT 165 178 214 236   Basic Metabolic Panel: Recent Labs  Lab 06/30/19 1738 07/01/19 0303 07/02/19 0420 07/03/19 0343  NA 136 136 140 141  K 3.5 3.6 4.0 4.2  CL 100 98 106 106  CO2 26 22 20* 24  GLUCOSE 169* 211* 237* 264*  BUN 10 10 22* 24*  CREATININE 0.66 0.47 0.62 0.57  CALCIUM 8.4* 8.7* 8.5* 8.5*   GFR: Estimated Creatinine Clearance: 80.6 mL/min (by C-G formula based on SCr of 0.57 mg/dL). Liver Function Tests: Recent Labs  Lab 06/30/19 1738 07/01/19 0303 07/02/19 0420 07/03/19 0343  AST 45* 40 28 19  ALT 36 36 35 28  ALKPHOS 60 59 53 50  BILITOT 0.5 0.4 0.8 0.2*  PROT 7.4 7.6 6.4* 6.2*  ALBUMIN 3.5 3.5 3.1* 2.9*   No results for input(s): LIPASE, AMYLASE in the last 168 hours. No results for input(s): AMMONIA in  the last 168 hours. Coagulation Profile: No results for input(s): INR, PROTIME in the last 168 hours. Cardiac Enzymes: No results for input(s): CKTOTAL, CKMB, CKMBINDEX, TROPONINI in the last 168 hours. BNP (last 3 results) No results for input(s): PROBNP in the last 8760 hours. HbA1C: Recent Labs    07/02/19 0420  HGBA1C 7.0*   CBG: Recent Labs  Lab 07/02/19 0909 07/02/19 1206 07/02/19 1620 07/02/19 2114 07/03/19 0847  GLUCAP 275* 311* 271* 250* 239*   Lipid Profile: No results for input(s): CHOL, HDL, LDLCALC, TRIG, CHOLHDL, LDLDIRECT in the last 72 hours. Thyroid Function Tests: No results for input(s): TSH, T4TOTAL, FREET4, T3FREE, THYROIDAB in the last 72 hours. Anemia Panel: Recent Labs    07/02/19 0420 07/03/19 0344  FERRITIN 142 124   Sepsis Labs: Recent Labs  Lab 06/30/19 2223  PROCALCITON <0.10    Recent Results (from the past 240 hour(s))  SARS CORONAVIRUS 2 (TAT 6-24 HRS) Nasopharyngeal Nasopharyngeal Swab     Status: Abnormal   Collection Time: 07/01/19  1:42 AM   Specimen: Nasopharyngeal Swab  Result  Value Ref Range Status   SARS Coronavirus 2 POSITIVE (A) NEGATIVE Final    Comment: RESULT CALLED TO, READ BACK BY AND VERIFIED WITH: Elby Showers RN 15:00 07/01/19 ([email or web address]) (NOTE) SARS-CoV-2 target nucleic acids are DETECTED. The SARS-CoV-2 RNA is generally detectable in upper and lower respiratory specimens during the acute phase of infection. Positive results are indicative of the presence of SARS-CoV-2 RNA. Clinical correlation with patient history and other diagnostic information is  necessary to determine patient infection status. Positive results do not rule out bacterial infection or co-infection with other viruses.  The expected result is Negative. Fact Sheet for Patients: HairSlick.no Fact Sheet for Healthcare Providers: quierodirigir.com This test is not yet approved or cleared by the [place] FDA and  has been authorized for detection and/or diagnosis of SARS-CoV-2 by FDA under an Emergency Use Authorization (EUA). This EUA will remain  in effect (meaning this test can be used) for the  duration of the COVID-19 declaration under Section 564(b)(1) of the Act, 21 U.S.C. section 360bbb-3(b)(1), unless the authorization is terminated or revoked sooner. Performed at [facility], [address] [place], [place] [address]   SARS Coronavirus 2 by RT PCR (hospital order, performed in [facility] hospital lab) Nasopharyngeal Nasopharyngeal Swab     Status: Abnormal   Collection Time: 07/01/19  9:03 AM   Specimen: Nasopharyngeal Swab  Result Value Ref Range Status   SARS Coronavirus 2 POSITIVE (A) NEGATIVE Final    Comment: (NOTE) SARS-CoV-2 target nucleic acids are DETECTED SARS-CoV-2 RNA is generally detectable in upper respiratory specimens  during the acute phase of infection.  Positive results are indicative  of the presence of the identified virus, but do not rule out bacterial infection or co-infection with  other pathogens not detected by the test.  Clinical correlation with patient history and  other diagnostic information is necessary to determine patient infection status.  The expected result is negative. Fact Sheet for Patients:   BoilerBrush.com.cy  Fact Sheet for Healthcare Providers:   https://pope.com/   This test is not yet approved or cleared by the [place] FDA and  has been authorized for detection and/or diagnosis of SARS-CoV-2 by FDA under an Emergency Use Authorization (EUA).  This EUA will remain in effect (meaning this test can be used) for the duration of  the COVID-19 declaration under Section 564(b)(1) of the A ct, 21 U.S.C.  section 360-bbb-3(b)(1), unless the authorization is terminated or revoked sooner. Performed at [facility], [address] [place], [place] [address]       Radiology Studies: No results found.    LOS: 3 days   Time spent: More than 50% of that time was spent in counseling and/or coordination of care.  Lanae Boast, MD [facility]  07/03/2019, 11:35 AM

## 2019-07-04 LAB — TYPE AND SCREEN
ABO/RH(D): O POS
Antibody Screen: NEGATIVE

## 2019-07-04 LAB — COMPREHENSIVE METABOLIC PANEL
ALT: 28 U/L (ref 0–44)
AST: 20 U/L (ref 15–41)
Albumin: 3 g/dL — ABNORMAL LOW (ref 3.5–5.0)
Alkaline Phosphatase: 55 U/L (ref 38–126)
Anion gap: 7 (ref 5–15)
BUN: 25 mg/dL — ABNORMAL HIGH (ref 6–20)
CO2: 24 mmol/L (ref 22–32)
Calcium: 8.5 mg/dL — ABNORMAL LOW (ref 8.9–10.3)
Chloride: 105 mmol/L (ref 98–111)
Creatinine, Ser: 0.67 mg/dL (ref 0.44–1.00)
GFR calc Af Amer: >60 mL/min (ref >60)
GFR calc non Af Amer: >60 mL/min (ref >60)
Glucose, Bld: 255 mg/dL — ABNORMAL HIGH (ref 70–99)
Potassium: 4.2 mmol/L (ref 3.5–5.1)
Sodium: 136 mmol/L (ref 135–145)
Total Bilirubin: 0.5 mg/dL (ref 0.3–1.2)
Total Protein: 6.2 g/dL — ABNORMAL LOW (ref 6.5–8.1)

## 2019-07-04 LAB — CBC WITH DIFFERENTIAL/PLATELET
Abs Immature Granulocytes: 0.14 10*3/uL — ABNORMAL HIGH (ref 0.00–0.07)
Basophils Absolute: 0 10*3/uL (ref 0.0–0.1)
Basophils Relative: 0 %
Eosinophils Absolute: 0 10*3/uL (ref 0.0–0.5)
Eosinophils Relative: 0 %
HCT: 37.4 % (ref 36.0–46.0)
Hemoglobin: 11.9 g/dL — ABNORMAL LOW (ref 12.0–15.0)
Immature Granulocytes: 2 %
Lymphocytes Relative: 12 %
Lymphs Abs: 1 10*3/uL (ref 0.7–4.0)
MCH: 26.7 pg (ref 26.0–34.0)
MCHC: 31.8 g/dL (ref 30.0–36.0)
MCV: 83.9 fL (ref 80.0–100.0)
Monocytes Absolute: 0.5 10*3/uL (ref 0.1–1.0)
Monocytes Relative: 5 %
Neutro Abs: 7 10*3/uL (ref 1.7–7.7)
Neutrophils Relative %: 81 %
Platelets: 267 10*3/uL (ref 150–400)
RBC: 4.46 MIL/uL (ref 3.87–5.11)
RDW: 13 % (ref 11.5–15.5)
WBC: 8.6 10*3/uL (ref 4.0–10.5)
nRBC: 0 % (ref 0.0–0.2)

## 2019-07-04 LAB — MAGNESIUM: Magnesium: 2.2 mg/dL (ref 1.7–2.4)

## 2019-07-04 LAB — GLUCOSE, CAPILLARY
Glucose-Capillary: 280 mg/dL — ABNORMAL HIGH (ref 70–99)
Glucose-Capillary: 338 mg/dL — ABNORMAL HIGH (ref 70–99)
Glucose-Capillary: 258 mg/dL — ABNORMAL HIGH (ref 70–99)

## 2019-07-04 LAB — BRAIN NATRIURETIC PEPTIDE: B Natriuretic Peptide: 99.5 pg/mL (ref 0.0–100.0)

## 2019-07-04 LAB — D-DIMER, QUANTITATIVE: D-Dimer, Quant: 0.39 ug/mL-FEU (ref 0.00–0.50)

## 2019-07-04 LAB — C-REACTIVE PROTEIN: CRP: 1.4 mg/dL — ABNORMAL HIGH (ref <1.0)

## 2019-07-04 MED ORDER — HEPARIN SODIUM (PORCINE) 5000 UNIT/ML IJ SOLN: 5000.0000 [IU] | Freq: Three times a day (TID) | INTRAMUSCULAR | Status: DC

## 2019-07-04 MED ORDER — DEXAMETHASONE SODIUM PHOSPHATE 4 MG/ML IJ SOLN
4.0000 mg | Freq: Every day | INTRAMUSCULAR | Status: DC
  Administered 2019-07-04: 4 mg via INTRAVENOUS
  Filled 2019-07-04: qty 1

## 2019-07-04 MED ORDER — ENOXAPARIN SODIUM 40 MG/0.4ML ~~LOC~~ SOLN
40.0000 mg | SUBCUTANEOUS | Status: DC
  Administered 2019-07-04: 40 mg via SUBCUTANEOUS
  Filled 2019-07-04: qty 0.4

## 2019-07-04 NOTE — Progress Notes (Signed)
Pt received 4/5 Remdesivir doses Pt is now 97% on RA. NO pain or n/v. Pt appetite was good today. Walked in the room a lot & sat up in chair.

## 2019-07-04 NOTE — Progress Notes (Signed)
PROGRESS NOTE                                                                                                                                                                                                             Patient Demographics:    Cheryl Patrick, is a 48 y.o. female, DOB - 10/01/1970, JQG:[id number]  Outpatient Primary MD for the patient is System, Pcp Not In    LOS - 4  Admit date - 06/30/2019    Chief Complaint  Patient presents with  . covid Other  . Cough  . Shortness of Breath       Brief Narrative   48 year old female with history of anxiety, HLD, T2DM who tested + FOR  COVID-19 at [facility] who as on albuterol inh, Z pack at home presented to ER 06/30/19 night with dyspnea worse with exertion.  Patient on admission c/o of fever x 3-4 days with slight nausea, vomiting and diarrhea as well.  In the ER febrile 9.5 tachycardic tachypneic up to 33 saturating 90% on room air on ambulation dropped to 89%.  Chest x-ray with patchy bilateral groundglass and interstitial opacities, WBC 6.0 hemoglobin 12.9 platelet 150 5K, creatinine 0.6, procalcitonin less than 0.1 CRP 11.9, ferritin normal 172,D dimer slightly +0.55 LDH 220, troponin less than 2 BNP 19.8, Patient was admitted and is being treated for COVID-19 pneumonia and hypoxic respiratory failure   Subjective:    Cheryl Patrick today has, No headache, No chest pain, No abdominal pain - No Nausea, No new weakness tingling or numbness, no Cough - SOB.     Assessment  & Plan :     1.  Acute Covid 19 Viral Pneumonitis during the ongoing 2020 Covid 19 Pandemic - She had mild to moderate disease, stable on room air, stable inflammatory markers, finish her steroid and remdesivir course and discharge home tomorrow.  Steroid dose has been tapered.  Encouraged the patient to sit up in chair in the daytime use I-S and flutter valve for pulmonary toiletry  and then prone in bed when at night.  SpO2: 97 % O2 Flow Rate (L/min): 2 L/min  Recent Labs  Lab 06/30/19 2223 07/01/19 0142 07/01/19 0903 07/02/19 0420 07/03/19 0343 07/03/19 0344 07/04/19 0012  CRP 11.9*  --   --  5.3*  --  2.0* 1.4*  DDIMER 0.55*  --   --  0.42 0.33  --  0.39  FERRITIN 172  --   --  142  --  124  --   BNP 19.8  --   --   --   --   --  99.5  PROCALCITON <0.10  --   --   --   --   --   --   SARSCOV2NAA  --  POSITIVE* POSITIVE*  --   --   --   --     Hepatic Function Latest Ref Rng & Units 07/04/2019 07/03/2019 07/02/2019  Total Protein 6.5 - 8.1 g/dL 6.2(L) 6.2(L) 6.4(L)  Albumin 3.5 - 5.0 g/dL 3.0(L) 2.9(L) 3.1(L)  AST 15 - 41 U/L 20 19 28   ALT 0 - 44 U/L 28 28 35  Alk Phosphatase 38 - 126 U/L 55 50 53  Total Bilirubin 0.3 - 1.2 mg/dL 0.5 0.2(L) 0.8     2.  Dyslipidemia.  Not on meds.  Follow with PCP.  3.  DM type II.  Stable outpatient control with A1c of 7, on Lantus and sliding scale.  Discontinued Premeal NovoLog as steroid being tapered.  CBG (last 3)  Recent Labs    07/03/19 1818 07/03/19 2202 07/04/19 0756  GLUCAP 157* 286* 280*       Condition - Fair  Family Communication  :  None  Code Status :  Full  Diet :   Diet Order            Diet heart healthy/carb modified Room service appropriate? Yes; Fluid consistency: Thin  Diet effective now               Disposition Plan  : Home in a.m.  Consults  :  None  Procedures  :     PUD Prophylaxis :    DVT Prophylaxis  :  Lovenox ordered  Lab Results  Component Value Date   PLT 267 07/04/2019    Inpatient Medications  Scheduled Meds: . dexamethasone (DECADRON) injection  4 mg Intravenous QHS  . enoxaparin (LOVENOX) injection  40 mg Subcutaneous Q24H  . feeding supplement (GLUCERNA SHAKE)  237 mL Oral TID BM  . insulin aspart  0-5 Units Subcutaneous QHS  . insulin aspart  0-9 Units Subcutaneous TID WC  . insulin glargine  10 Units Subcutaneous Daily  . linagliptin  5  mg Oral Daily  . multivitamin with minerals  1 tablet Oral Daily   Continuous Infusions: . sodium chloride Stopped (07/01/19 2217)  . remdesivir 100 mg in NS 100 mL 100 mg (07/04/19 0945)   PRN Meds:.sodium chloride, albuterol, guaiFENesin-dextromethorphan  Antibiotics  :    Anti-infectives (From admission, onward)   Start     Dose/Rate Route Frequency Ordered Stop   07/03/19 1000  remdesivir 100 mg in sodium chloride 0.9 % 100 mL IVPB     100 mg 200 mL/hr over 30 Minutes Intravenous Daily 07/03/19 0656 07/06/19 0959   07/02/19 1000  remdesivir 100 mg in sodium chloride 0.9 % 250 mL IVPB  Status:  Discontinued     100 mg 500 mL/hr over 30 Minutes Intravenous Every 24 hours 07/01/19 0751 07/03/19 0656   07/01/19 1000  remdesivir 200 mg in sodium chloride 0.9 % 250 mL IVPB     200 mg 500 mL/hr over 30 Minutes Intravenous Once 07/01/19 0751 07/01/19 0930   06/30/19 2230  cefTRIAXone (ROCEPHIN) 1 g in sodium chloride 0.9 % 100 mL IVPB  Status:  Discontinued     1 g 200 mL/hr over 30 Minutes Intravenous Daily at bedtime 06/30/19 2224 07/02/19 1138   06/30/19 2230  azithromycin (ZITHROMAX) 500 mg in sodium chloride 0.9 % 250 mL IVPB  Status:  Discontinued     500 mg 250 mL/hr over 60 Minutes Intravenous Daily at bedtime 06/30/19 2224 07/02/19 1138       Time Spent in minutes  30   Lala Lund M.D on 07/04/2019 at 10:30 AM  To page go to [email or web address] - password [facility]  [facility] -  Office  [phone]    See all Orders from today for further details    Objective:   Vitals:   07/03/19 1953 07/04/19 0400 07/04/19 0745 07/04/19 0749  BP: 112/64 122/65 125/71   Pulse: 73 64 60   Resp: 16 17 18    Temp: 97.8 F (36.6 C) 97.6 F (36.4 C) (!) 97.4 F (36.3 C)   TempSrc: Oral Oral Oral Oral  SpO2: 96% 96% 97%   Weight:      Height:        Wt Readings from Last 3 Encounters:  07/01/19 71.6 kg  06/12/17 77.6 kg  05/07/17 78 kg     Intake/Output Summary  (Last 24 hours) at 07/04/2019 1030 Last data filed at 07/04/2019 0300 Gross per 24 hour  Intake 100 ml  Output -  Net 100 ml     Physical Exam  Awake Alert,   No new F.N deficits, Normal affect [place].AT,PERRAL Supple Neck,No JVD, No cervical lymphadenopathy appriciated.  Symmetrical Chest wall movement, Good air movement bilaterally, CTAB RRR,No Gallops,Rubs or new Murmurs, No Parasternal Heave +ve B.Sounds, Abd Soft, No tenderness, No organomegaly appriciated, No rebound - guarding or rigidity. No Cyanosis, Clubbing or edema, No new Rash or bruise     Data Review:    CBC Recent Labs  Lab 06/30/19 1738 07/01/19 0303 07/02/19 0420 07/03/19 0343 07/04/19 0012  WBC 6.0 3.4* 7.2 7.6 8.6  HGB 12.8 13.0 11.8* 11.6* 11.9*  HCT 40.3 41.0 37.1 36.8 37.4  PLT 165 178 214 236 267  MCV 84.0 83.0 84.3 85.4 83.9  MCH 26.7 26.3 26.8 26.9 26.7  MCHC 31.8 31.7 31.8 31.5 31.8  RDW 13.2 13.1 13.0 13.1 13.0  LYMPHSABS 1.1 0.4* 0.8 0.9 1.0  MONOABS 0.3 0.1 0.4 0.3 0.5  EOSABS 0.0 0.0 0.0 0.0 0.0  BASOSABS 0.0 0.0 0.0 0.0 0.0    Chemistries  Recent Labs  Lab 06/30/19 1738 07/01/19 0303 07/02/19 0420 07/03/19 0343 07/04/19 0012  NA 136 136 140 141 136  K 3.5 3.6 4.0 4.2 4.2  CL 100 98 106 106 105  CO2 26 22 20* 24 24  GLUCOSE 169* 211* 237* 264* 255*  BUN 10 10 22* 24* 25*  CREATININE 0.66 0.47 0.62 0.57 0.67  CALCIUM 8.4* 8.7* 8.5* 8.5* 8.5*  MG  --   --   --   --  2.2  AST 45* 40 28 19 20   ALT 36 36 35 28 28  ALKPHOS 60 59 53 50 55  BILITOT 0.5 0.4 0.8 0.2* 0.5   ------------------------------------------------------------------------------------------------------------------ No results for input(s): CHOL, HDL, LDLCALC, TRIG, CHOLHDL, LDLDIRECT in the last 72 hours.  Lab Results  Component Value Date   HGBA1C 7.0 (H) 07/02/2019   ------------------------------------------------------------------------------------------------------------------ No results for input(s):  TSH, T4TOTAL, T3FREE, THYROIDAB in the last 72 hours.  Invalid input(s): FREET3  Cardiac Enzymes No results for input(s): CKMB, TROPONINI, MYOGLOBIN in the  last 168 hours.  Invalid input(s): CK ------------------------------------------------------------------------------------------------------------------    Component Value Date/Time   BNP 99.5 07/04/2019 0012    Micro Results Recent Results (from the past 240 hour(s))  SARS CORONAVIRUS 2 (TAT 6-24 HRS) Nasopharyngeal Nasopharyngeal Swab     Status: Abnormal   Collection Time: 07/01/19  1:42 AM   Specimen: Nasopharyngeal Swab  Result Value Ref Range Status   SARS Coronavirus 2 POSITIVE (A) NEGATIVE Final    Comment: RESULT CALLED TO, READ BACK BY AND VERIFIED WITH: Marijean Bravo RN 15:00 07/01/19 ([email or web address]) (NOTE) SARS-CoV-2 target nucleic acids are DETECTED. The SARS-CoV-2 RNA is generally detectable in upper and lower respiratory specimens during the acute phase of infection. Positive results are indicative of the presence of SARS-CoV-2 RNA. Clinical correlation with patient history and other diagnostic information is  necessary to determine patient infection status. Positive results do not rule out bacterial infection or co-infection with other viruses.  The expected result is Negative. Fact Sheet for Patients: SugarRoll.be Fact Sheet for Healthcare Providers: https://www.woods-mathews.com/ This test is not yet approved or cleared by the [place] FDA and  has been authorized for detection and/or diagnosis of SARS-CoV-2 by FDA under an Emergency Use Authorization (EUA). This EUA will remain  in effect (meaning this test can be used) for the  duration of the COVID-19 declaration under Section 564(b)(1) of the Act, 21 U.S.C. section 360bbb-3(b)(1), unless the authorization is terminated or revoked sooner. Performed at [facility], [place] [address] [place], [place] [address]    SARS Coronavirus 2 by RT PCR (hospital order, performed in [facility] hospital lab) Nasopharyngeal Nasopharyngeal Swab     Status: Abnormal   Collection Time: 07/01/19  9:03 AM   Specimen: Nasopharyngeal Swab  Result Value Ref Range Status   SARS Coronavirus 2 POSITIVE (A) NEGATIVE Final    Comment: (NOTE) SARS-CoV-2 target nucleic acids are DETECTED SARS-CoV-2 RNA is generally detectable in upper respiratory specimens  during the acute phase of infection.  Positive results are indicative  of the presence of the identified virus, but do not rule out bacterial infection or co-infection with other pathogens not detected by the test.  Clinical correlation with patient history and  other diagnostic information is necessary to determine patient infection status.  The expected result is negative. Fact Sheet for Patients:   StrictlyIdeas.no  Fact Sheet for Healthcare Providers:   BankingDealers.co.za   This test is not yet approved or cleared by the [place] FDA and  has been authorized for detection and/or diagnosis of SARS-CoV-2 by FDA under an Emergency Use Authorization (EUA).  This EUA will remain in effect (meaning this test can be used) for the duration of  the COVID-19 declaration under Section 564(b)(1) of the A ct, 21 U.S.C. section 360-bbb-3(b)(1), unless the authorization is terminated or revoked sooner. Performed at [facility], [place] [address], [place],  [address]     Radiology Reports Dg Chest Port 1 View  Result Date: 06/30/2019 CLINICAL DATA:  Short of breath COVID positive EXAM: PORTABLE CHEST 1 VIEW COMPARISON:  10/23/2013 FINDINGS: Patchy mostly peripheral and basilar ground-glass opacities. No pleural effusion. Normal heart size. No pneumothorax. IMPRESSION: Patchy bilateral ground-glass and interstitial opacities, felt consistent with multifocal pneumonia and history of COVID positivity.  Electronically Signed   By: Donavan Foil M.D.   On: 06/30/2019 18:11

## 2019-07-05 LAB — GLUCOSE, CAPILLARY
Glucose-Capillary: 271 mg/dL — ABNORMAL HIGH (ref 70–99)
Glucose-Capillary: 348 mg/dL — ABNORMAL HIGH (ref 70–99)
Glucose-Capillary: 424 mg/dL — ABNORMAL HIGH (ref 70–99)

## 2019-07-05 MED ORDER — DEXAMETHASONE SODIUM PHOSPHATE 4 MG/ML IJ SOLN: 2.0000 mg | Freq: Every day | INTRAMUSCULAR | Status: DC

## 2019-07-05 MED ORDER — INSULIN GLARGINE 100 UNIT/ML ~~LOC~~ SOLN
15.0000 [IU] | Freq: Every day | SUBCUTANEOUS | Status: DC
  Administered 2019-07-05: 15 [IU] via SUBCUTANEOUS
  Filled 2019-07-05: qty 0.15

## 2019-07-05 NOTE — Progress Notes (Signed)
PT Cancellation/Discharge Note  Patient Details Name: Cheryl Patrick MRN: [id number] DOB: 01-Mar-1971   Cancelled Treatment:    Reason Eval/Treat Not Completed: PT screened, no needs identified, will sign off.  RN reports pt independent in room, O2 sats on RA in the 90s.  No PT needs identified at this time.  Pt due to d/c home today.   Thanks,  Verdene Lennert, PT, DPT  Acute Rehabilitation [phone] pager [phone] office  @ [facility]: [phone]     Harvie Heck 07/05/2019, 11:47 AM

## 2019-07-05 NOTE — Discharge Summary (Signed)
Cheryl Patrick PVG:[id number] DOB: 10/10/1970 DOA: 06/30/2019  PCP: System, Pcp Not In  Admit date: 06/30/2019  Discharge date: 07/05/2019  Admitted From: Home   Disposition:  Home   Recommendations for Outpatient Follow-up:   Follow up with PCP in 1-2 weeks  PCP Please obtain BMP/CBC, 2 view CXR in 1week,  (see Discharge instructions)   PCP Please follow up on the following pending results:    Home Health: None   Equipment/Devices: None  Consultations: None  Discharge Condition: Stable    CODE STATUS: Full    Diet Recommendation: Heart Healthy Low Carb    Chief Complaint  Patient presents with  . covid Other  . Cough  . Shortness of Breath     Brief history of present illness from the day of admission and additional interim summary    48 year old female with history of anxiety, HLD, T2DM who tested + FOR COVID-19 at [facility] who as on albuterol inh, Z pack at home presented to ER 06/30/19 night with dyspnea worse with exertion. Patient on admission c/o of fever x 3-4 days with slight nausea, vomiting and diarrhea as well.  In the ER febrile 9.5 tachycardic tachypneic up to 33 saturating 90% on room air on ambulation dropped to 89%. Chest x-ray with patchy bilateral groundglass and interstitial opacities, WBC 6.0 hemoglobin 12.9 platelet 150 5K, creatinine 0.6, procalcitonin less than 0.1 CRP 11.9, ferritin normal 172,D dimer slightly +0.55 LDH 220, troponin less than 2 BNP 19.8, Patientwasadmitted and isbeing treated for COVID-19 pneumonia and hypoxic respiratory failure                                                                 Hospital Course   1.  Acute Covid 19 Viral Pneumonitis during the ongoing 2020 Covid 19 Pandemic - She had mild to moderate disease, stable on room air,  stable inflammatory markers, she has finished her steroid and remdesivir course and will be discharged home.   COVID-19 Labs  Recent Labs    07/03/19 0343 07/03/19 0344 07/04/19 0012  DDIMER 0.33  --  0.39  FERRITIN  --  124  --   CRP  --  2.0* 1.4*    Lab Results  Component Value Date   SARSCOV2NAA POSITIVE (A) 07/01/2019   SARSCOV2NAA POSITIVE (A) 07/01/2019   [place] Not Detected 03/11/2019     Hepatic Function Latest Ref Rng & Units 07/04/2019 07/03/2019 07/02/2019  Total Protein 6.5 - 8.1 g/dL 6.2(L) 6.2(L) 6.4(L)  Albumin 3.5 - 5.0 g/dL 3.0(L) 2.9(L) 3.1(L)  AST 15 - 41 U/L _0 ALT 0 - 44 U/L 28 28 35  Alk Phosphatase 38 - 126 U/L 55 50 53  Total Bilirubin 0.3 - 1.2 mg/dL 0.5 0.2(L) 0.8    2.  Dyslipidemia.  Not on meds.  Follow with PCP.  3.  DM type II.  Stable outpatient control with A1c of 7, DC on Home Rx.Marland Kitchen  Discharge diagnosis     Principal Problem:   COVID-19 virus infection Active Problems:   CAP (community acquired pneumonia)    Discharge instructions    Discharge Instructions    Discharge instructions   Complete by: As directed    Follow with Primary MD  in 7 days   Get CBC, CMP, 2 view Chest X ray -  checked next visit within 1 week by Primary MD   Activity: As tolerated with Full fall precautions use walker/cane & assistance as needed  Disposition Home    Diet: Heart Healthy  Low Carb  Special Instructions: If you have smoked or chewed Tobacco  in the last 2 yrs please stop smoking, stop any regular Alcohol  and or any Recreational drug use.  On your next visit with your primary care physician please Get Medicines reviewed and adjusted.  Please request your Prim.MD to go over all Hospital Tests and Procedure/Radiological results at the follow up, please get all Hospital records sent to your Prim MD by signing hospital release before you go home.  If you experience worsening of your admission symptoms, develop shortness of  breath, life threatening emergency, suicidal or homicidal thoughts you must seek medical attention immediately by calling 911 or calling your MD immediately  if symptoms less severe.  You Must read complete instructions/literature along with all the possible adverse reactions/side effects for all the Medicines you take and that have been prescribed to you. Take any new Medicines after you have completely understood and accpet all the possible adverse reactions/side effects.   Increase activity slowly   Complete by: As directed    MyChart COVID-19 home monitoring program   Complete by: Jul 05, 2019    Is the patient willing to use the [place] for home monitoring?: Yes   Temperature monitoring   Complete by: Jul 05, 2019    After how many days would you like to receive a notification of this patient's flowsheet entries?: 1      Discharge Medications   Allergies as of 07/05/2019   No Known Allergies     Medication List    TAKE these medications   acetaminophen 500 MG tablet Commonly known as: TYLENOL Take 500 mg by mouth every 6 (six) hours as needed for moderate pain.   ferrous gluconate 324 MG tablet Commonly known as: FERGON Take 1 tablet (324 mg total) by mouth 2 (two) times daily with a meal.   metFORMIN 500 MG tablet Commonly known as: GLUCOPHAGE Take 500 mg by mouth 2 (two) times daily.       Follow-up [place]. Schedule an appointment as soon as possible for a visit in 1 week(s).   Specialty: Internal Medicine Contact information: [address] [id number] [place] [phone]          Major procedures and Radiology Reports - PLEASE review detailed and final reports thoroughly  -       Dg Chest Port 1 View  Result Date: 06/30/2019 CLINICAL DATA:  Short of breath COVID positive EXAM: PORTABLE CHEST 1 VIEW COMPARISON:  10/23/2013 FINDINGS: Patchy mostly peripheral and  basilar ground-glass opacities. No pleural effusion. Normal heart size. No pneumothorax. IMPRESSION: Patchy bilateral ground-glass and interstitial opacities, felt consistent with multifocal pneumonia and  history of COVID positivity. Electronically Signed   By: Donavan Foil M.D.   On: 06/30/2019 18:11    Micro Results     Recent Results (from the past 240 hour(s))  SARS CORONAVIRUS 2 (TAT 6-24 HRS) Nasopharyngeal Nasopharyngeal Swab     Status: Abnormal   Collection Time: 07/01/19  1:42 AM   Specimen: Nasopharyngeal Swab  Result Value Ref Range Status   SARS Coronavirus 2 POSITIVE (A) NEGATIVE Final    Comment: RESULT CALLED TO, READ BACK BY AND VERIFIED WITH: Marijean Bravo RN 15:00 07/01/19 ([email or web address]) (NOTE) SARS-CoV-2 target nucleic acids are DETECTED. The SARS-CoV-2 RNA is generally detectable in upper and lower respiratory specimens during the acute phase of infection. Positive results are indicative of the presence of SARS-CoV-2 RNA. Clinical correlation with patient history and other diagnostic information is  necessary to determine patient infection status. Positive results do not rule out bacterial infection or co-infection with other viruses.  The expected result is Negative. Fact Sheet for Patients: SugarRoll.be Fact Sheet for Healthcare Providers: https://www.woods-mathews.com/ This test is not yet approved or cleared by the [place] FDA and  has been authorized for detection and/or diagnosis of SARS-CoV-2 by FDA under an Emergency Use Authorization (EUA). This EUA will remain  in effect (meaning this test can be used) for the  duration of the COVID-19 declaration under Section 564(b)(1) of the Act, 21 U.S.C. section 360bbb-3(b)(1), unless the authorization is terminated or revoked sooner. Performed at [facility], [place] [address], [place], [place] [address]   SARS Coronavirus 2 by RT PCR (hospital order, performed in [facility] hospital lab) Nasopharyngeal Nasopharyngeal Swab     Status: Abnormal   Collection Time: 07/01/19  9:03 AM   Specimen: Nasopharyngeal Swab  Result Value Ref Range Status   SARS Coronavirus 2 POSITIVE (A) NEGATIVE Final    Comment: (NOTE) SARS-CoV-2 target nucleic acids are DETECTED SARS-CoV-2 RNA is generally detectable in upper respiratory specimens  during the acute phase of infection.  Positive results are indicative  of the presence of the identified virus, but do not rule out bacterial infection or co-infection with other pathogens not detected by the test.  Clinical correlation with patient history and  other diagnostic information is necessary to determine patient infection status.  The expected result is negative. Fact Sheet for Patients:   StrictlyIdeas.no  Fact Sheet for Healthcare Providers:   BankingDealers.co.za   This test is not yet approved or cleared by the [place] FDA and  has been authorized for detection and/or diagnosis of SARS-CoV-2 by FDA under an Emergency Use Authorization (EUA).  This EUA will remain in effect (meaning this test can be used) for the duration of  the COVID-19 declaration under Section 564(b)(1) of the A ct, 21 U.S.C. section 360-bbb-3(b)(1), unless the authorization is terminated or revoked sooner. Performed at [facility], [place] [address] [place], [place] [address]     Today   Subjective    Cheryl Patrick today has no headache,no chest abdominal pain,no new weakness tingling or numbness, feels much better wants to go home today.     Objective   Blood pressure 109/68, pulse 66, temperature 98.2 F (36.8 C), temperature source Oral, resp. rate 18, height _0  (1.575 m), weight 71.6 kg, last menstrual period 06/13/2019, SpO2 94 %.   Intake/Output Summary (Last 24 hours) at 07/05/2019 0935 Last data filed at 07/05/2019 0900 Gross per 24 hour  Intake 490  ml  Output -  Net 490 ml    Exam  Awake Alert, Oriented x 3, No new F.N deficits, Normal affect [place].AT,PERRAL Supple Neck,No JVD, No cervical lymphadenopathy appriciated.  Symmetrical Chest wall movement, Good air movement bilaterally, CTAB RRR,No Gallops,Rubs or new Murmurs, No Parasternal Heave +ve B.Sounds, Abd Soft, Non tender, No organomegaly appriciated, No rebound -guarding or rigidity. No Cyanosis, Clubbing or edema, No new Rash or bruise   Data Review   CBC w Diff:  Lab Results  Component Value Date   WBC 8.6 07/04/2019   HGB 11.9 (L) 07/04/2019   HCT 37.4 07/04/2019   PLT 267 07/04/2019   LYMPHOPCT 12 07/04/2019   MONOPCT 5 07/04/2019   EOSPCT 0 07/04/2019   BASOPCT 0 07/04/2019    CMP:  Lab Results  Component Value Date   NA 136 07/04/2019   K 4.2 07/04/2019   CL 105 07/04/2019   CO2 24 07/04/2019   BUN 25 (H) 07/04/2019   CREATININE 0.67 07/04/2019   CREATININE 0.64 07/06/2015   PROT 6.2 (L) 07/04/2019   ALBUMIN 3.0 (L) 07/04/2019   BILITOT 0.5 07/04/2019   ALKPHOS 55 07/04/2019   AST 20 07/04/2019   ALT 28 07/04/2019  .   Total Time in preparing paper work, data evaluation and todays exam - 60 minutes  Lala Lund M.D on 07/05/2019 at 9:35 AM  [facility]   Office  [phone]

## 2019-07-05 NOTE — Discharge Instructions (Signed)
Follow with Primary MD  in 7 days   Get CBC, CMP, 2 view Chest X ray -  checked next visit within 1 week by Primary MD   Activity: As tolerated with Full fall precautions use walker/cane & assistance as needed  Disposition Home    Diet: Heart Healthy  Low Carb  Special Instructions: If you have smoked or chewed Tobacco  in the last 2 yrs please stop smoking, stop any regular Alcohol  and or any Recreational drug use.  On your next visit with your primary care physician please Get Medicines reviewed and adjusted.  Please request your Prim.MD to go over all Hospital Tests and Procedure/Radiological results at the follow up, please get all Hospital records sent to your Prim MD by signing hospital release before you go home.  If you experience worsening of your admission symptoms, develop shortness of breath, life threatening emergency, suicidal or homicidal thoughts you must seek medical attention immediately by calling 911 or calling your MD immediately  if symptoms less severe.  You Must read complete instructions/literature along with all the possible adverse reactions/side effects for all the Medicines you take and that have been prescribed to you. Take any new Medicines after you have completely understood and accpet all the possible adverse reactions/side effects.       Person Under Monitoring Name: Cheryl Patrick  Location: [address] [place] [address]   Infection Prevention Recommendations for Individuals Confirmed to have, or Being Evaluated for, 2019 Novel Coronavirus (COVID-19) Infection Who Receive Care at Home  Individuals who are confirmed to have, or are being evaluated for, COVID-19 should follow the prevention steps below until a healthcare provider or local or state health department says they can return to normal activities.  Stay home except to get medical care You should restrict activities outside your home, except for getting medical care. Do not go to  work, school, or public areas, and do not use public transportation or taxis.  Call ahead before visiting your doctor Before your medical appointment, call the healthcare provider and tell them that you have, or are being evaluated for, COVID-19 infection. This will help the healthcare providers office take steps to keep other people from getting infected. Ask your healthcare provider to call the local or state health department.  Monitor your symptoms Seek prompt medical attention if your illness is worsening (e.g., difficulty breathing). Before going to your medical appointment, call the healthcare provider and tell them that you have, or are being evaluated for, COVID-19 infection. Ask your healthcare provider to call the local or state health department.  Wear a facemask You should wear a facemask that covers your nose and mouth when you are in the same room with other people and when you visit a healthcare provider. People who live with or visit you should also wear a facemask while they are in the same room with you.  Separate yourself from other people in your home As much as possible, you should stay in a different room from other people in your home. Also, you should use a separate bathroom, if available.  Avoid sharing household items You should not share dishes, drinking glasses, cups, eating utensils, towels, bedding, or other items with other people in your home. After using these items, you should wash them thoroughly with soap and water.  Cover your coughs and sneezes Cover your mouth and nose with a tissue when you cough or sneeze, or you can cough or sneeze into  your sleeve. Throw used tissues in a lined trash can, and immediately wash your hands with soap and water for at least 20 seconds or use an alcohol-based hand rub.  Wash your [facility] your hands often and thoroughly with soap and water for at least 20 seconds. You can use an alcohol-based hand sanitizer if  soap and water are not available and if your hands are not visibly dirty. Avoid touching your eyes, nose, and mouth with unwashed hands.   Prevention Steps for Caregivers and Household Members of Individuals Confirmed to have, or Being Evaluated for, COVID-19 Infection Being Cared for in the Home  If you live with, or provide care at home for, a person confirmed to have, or being evaluated for, COVID-19 infection please follow these guidelines to prevent infection:  Follow healthcare providers instructions Make sure that you understand and can help the patient follow any healthcare provider instructions for all care.  Provide for the patients basic needs You should help the patient with basic needs in the home and provide support for getting groceries, prescriptions, and other personal needs.  Monitor the patients symptoms If they are getting sicker, call his or her medical provider and tell them that the patient has, or is being evaluated for, COVID-19 infection. This will help the healthcare providers office take steps to keep other people from getting infected. Ask the healthcare provider to call the local or state health department.  Limit the number of people who have contact with the patient  If possible, have only one caregiver for the patient.  Other household members should stay in another home or place of residence. If this is not possible, they should stay  in another room, or be separated from the patient as much as possible. Use a separate bathroom, if available.  Restrict visitors who do not have an essential need to be in the home.  Keep older adults, very young children, and other sick people away from the patient Keep older adults, very young children, and those who have compromised immune systems or chronic health conditions away from the patient. This includes people with chronic heart, lung, or kidney conditions, diabetes, and cancer.  Ensure good  ventilation Make sure that shared spaces in the home have good air flow, such as from an air conditioner or an opened window, weather permitting.  Wash your hands often  Wash your hands often and thoroughly with soap and water for at least 20 seconds. You can use an alcohol based hand sanitizer if soap and water are not available and if your hands are not visibly dirty.  Avoid touching your eyes, nose, and mouth with unwashed hands.  Use disposable paper towels to dry your hands. If not available, use dedicated cloth towels and replace them when they become wet.  Wear a facemask and gloves  Wear a disposable facemask at all times in the room and gloves when you touch or have contact with the patients blood, body fluids, and/or secretions or excretions, such as sweat, saliva, sputum, nasal mucus, vomit, urine, or feces.  Ensure the mask fits over your nose and mouth tightly, and do not touch it during use.  Throw out disposable facemasks and gloves after using them. Do not reuse.  Wash your hands immediately after removing your facemask and gloves.  If your personal clothing becomes contaminated, carefully remove clothing and launder. Wash your hands after handling contaminated clothing.  Place all used disposable facemasks, gloves, and other waste in a  lined container before disposing them with other household waste.  Remove gloves and wash your hands immediately after handling these items.  Do not share dishes, glasses, or other household items with the patient  Avoid sharing household items. You should not share dishes, drinking glasses, cups, eating utensils, towels, bedding, or other items with a patient who is confirmed to have, or being evaluated for, COVID-19 infection.  After the person uses these items, you should wash them thoroughly with soap and water.  Wash laundry thoroughly  Immediately remove and wash clothes or bedding that have blood, body fluids, and/or  secretions or excretions, such as sweat, saliva, sputum, nasal mucus, vomit, urine, or feces, on them.  Wear gloves when handling laundry from the patient.  Read and follow directions on labels of laundry or clothing items and detergent. In general, wash and dry with the warmest temperatures recommended on the label.  Clean all areas the individual has used often  Clean all touchable surfaces, such as counters, tabletops, doorknobs, bathroom fixtures, toilets, phones, keyboards, tablets, and bedside tables, every day. Also, clean any surfaces that may have blood, body fluids, and/or secretions or excretions on them.  Wear gloves when cleaning surfaces the patient has come in contact with.  Use a diluted bleach solution (e.g., dilute bleach with 1 part bleach and 10 parts water) or a household disinfectant with a label that says EPA-registered for coronaviruses. To make a bleach solution at home, add 1 tablespoon of bleach to 1 quart (4 cups) of water. For a larger supply, add  cup of bleach to 1 gallon (16 cups) of water.  Read labels of cleaning products and follow recommendations provided on product labels. Labels contain instructions for safe and effective use of the cleaning product including precautions you should take when applying the product, such as wearing gloves or eye protection and making sure you have good ventilation during use of the product.  Remove gloves and wash hands immediately after cleaning.  Monitor yourself for signs and symptoms of illness Caregivers and household members are considered close contacts, should monitor their health, and will be asked to limit movement outside of the home to the extent possible. Follow the monitoring steps for close contacts listed on the symptom monitoring form.   ? If you have additional questions, contact your local health department or call the epidemiologist on call at [phone] (available 24/7). ? This guidance is subject  to change. For the most up-to-date guidance from [facility], please refer to their website: [email or web address]

## 2019-07-05 NOTE — Progress Notes (Signed)
OT Cancellation Note  Patient Details Name: Cheryl Patrick MRN: [id number] DOB: Sep 03, 1970   Cancelled Treatment:    Reason Eval/Treat Not Completed: OT screened, no needs identified, will sign off Met with pt in room, had just finished showering and washing hair. Was brushing teeth while simultaneously attending to other activities. She was also dressed in her own clothes and able to apply lotion on her whole body independently. PT on RA, reports much improvement in dyspnea. No OT recommended at this time. OT Will sign off. Please reconsult if changes arise, thank you for this consult.    Zenovia Jarred, MSOT, OTR/L Behavioral Health OT/ Acute Relief OT [facility] Office: [phone]  Zenovia Jarred 07/05/2019, 9:28 AM

## 2019-07-13 ENCOUNTER — Other Ambulatory Visit: Payer: Self-pay

## 2019-07-13 ENCOUNTER — Encounter: Payer: Self-pay | Admitting: Internal Medicine

## 2019-07-13 ENCOUNTER — Ambulatory Visit: Payer: HRSA Program | Attending: Internal Medicine | Admitting: Internal Medicine

## 2019-07-13 DIAGNOSIS — R05 Cough: Secondary | ICD-10-CM

## 2019-07-13 DIAGNOSIS — E119 Type 2 diabetes mellitus without complications: Secondary | ICD-10-CM

## 2019-07-13 DIAGNOSIS — U071 COVID-19: Secondary | ICD-10-CM | POA: Diagnosis not present

## 2019-07-13 DIAGNOSIS — R059 Cough, unspecified: Secondary | ICD-10-CM

## 2019-07-13 NOTE — Progress Notes (Signed)
Virtual Visit via Telephone Note  I connected with Rayana Geurin on 07/13/19 at  2:30 PM EST by telephone and verified that I am speaking with the correct person using two identifiers.  Location: Patient: home Provider: home   I discussed the limitations, risks, security and privacy concerns of performing an evaluation and management service by telephone and the availability of in person appointments. I also discussed with the patient that there may be a patient responsible charge related to this service. The patient expressed understanding and agreed to proceed.   History of Present Illness: 1. F/u Covid, diagnosed 11/24, hospitalized 12/2 rx steroids, remdisivir. Feeling better now, continues to have cough, fatigue, no sob,no c/p, some facial pain on right side.   Observations/Objective: F.s. 175 , 145, 224   Assessment and Plan: covid- improving,discussed clinical course,  continue rest, fluids, monitor sx, advised to continue quarantine for the rest of this week due to persistent cough Cough- otc meds for symptomatic relief Dm- followed by pcp, increase fluids, monitor f.s., f/u with pcp   Follow Up Instructions:    I discussed the assessment and treatment plan with the patient. The patient was provided an opportunity to ask questions and all were answered. The patient agreed with the plan and demonstrated an understanding of the instructions.   The patient was advised to call back or seek an in-person evaluation if the symptoms worsen or if the condition fails to improve as anticipated.  I provided 25 minutes of non-face-to-face time during this encounter.   Kimber Relic, MD

## 2019-07-13 NOTE — Progress Notes (Signed)
[place] interpreter- Roderic Palau # ID number [id number] assisted with calling the patient. Left voicemail to return call.   [phone] - fatigue  - cough, with some yellow phlegm.  - denies SOB - Has not tried medications OTC for cough  CBG- 224

## 2019-07-26 ENCOUNTER — Encounter: Payer: Self-pay | Admitting: *Deleted

## 2020-01-03 ENCOUNTER — Ambulatory Visit (INDEPENDENT_AMBULATORY_CARE_PROVIDER_SITE_OTHER): Payer: Self-pay | Admitting: Nurse Practitioner

## 2020-01-03 ENCOUNTER — Other Ambulatory Visit: Payer: Self-pay

## 2020-01-03 VITALS — BP 122/80 | HR 82 | Temp 97.7°F | Ht 60.0 in | Wt 168.0 lb

## 2020-01-03 DIAGNOSIS — R0683 Snoring: Secondary | ICD-10-CM

## 2020-01-03 DIAGNOSIS — R519 Headache, unspecified: Secondary | ICD-10-CM

## 2020-01-03 DIAGNOSIS — G4700 Insomnia, unspecified: Secondary | ICD-10-CM

## 2020-01-03 DIAGNOSIS — R002 Palpitations: Secondary | ICD-10-CM

## 2020-01-03 DIAGNOSIS — I499 Cardiac arrhythmia, unspecified: Secondary | ICD-10-CM

## 2020-01-03 DIAGNOSIS — Z8616 Personal history of COVID-19: Secondary | ICD-10-CM

## 2020-01-03 DIAGNOSIS — R5383 Other fatigue: Secondary | ICD-10-CM

## 2020-01-03 DIAGNOSIS — R413 Other amnesia: Secondary | ICD-10-CM

## 2020-01-03 MED ORDER — METFORMIN HCL 500 MG PO TABS: 500.0000 mg | ORAL_TABLET | Freq: Two times a day (BID) | ORAL | 0 refills | Status: DC

## 2020-01-03 NOTE — Patient Instructions (Addendum)
Irregular Heart rate °Heart palpitations: ° °Will refer to cardiology ° °EKG showed NSR in office today - may need Zio monitor ° °Diabetes: ° °Will refill metformin ° °Will have patient call [facility] for PCP ° ° °Please Schedule appointment with: ° °[facility] °[address] [place],  [place]  [address] ° [phone] ° °Insomnia °Snoring °Memory loss °Headaches: ° °Will place referral to neurology - may need cognitive assessment and may need sleep study ° ° °Fatigue: ° °Stay active ° °Start multivitamin ° °Stay well hydrated ° °Will order labs and call with results ° ° °Follow up: ° °Follow up in 2 months or sooner if needed ° °

## 2020-01-03 NOTE — Assessment & Plan Note (Signed)
Irregular Heart rate Heart palpitations:  Will refer to cardiology  EKG showed NSR in office today - may need Zio monitor  Diabetes:  Will refill metformin  Will have patient call [facility] for PCP   Please Schedule appointment with:  [facility] [address] [place],  [place]  [address]  [phone]  Insomnia Snoring Memory loss Headaches:  Will place referral to neurology - may need cognitive assessment and may need sleep study   Fatigue:  Stay active  Start multivitamin  Stay well hydrated  Will order labs and call with results   Follow up:  Follow up in 2 months or sooner if needed

## 2020-01-03 NOTE — Progress Notes (Signed)
@Patient  ID: , female    DOB: 02/08/1971, 49 y.o.   MRN: [id number]  Chief Complaint  Patient presents with  . Post COVID    Fatigue, cold sweats, leg pain, insomnia, brain fog, hair loss    Referring provider: No ref. provider found   49 year old female with history of diabetes, hyperlipidemia, and anxiety. Diagnosed with Covid in December 2020.   HPI  Patient presents today for post Covid care clinic visit.  Patient was diagnosed with Covid in December 2020.  Since that time she has been having ongoing issues with insomnia, leg pain, fatigue, brain fog, heart palpitations.  Patient states that she does snore heavily at nighttime.  She has not been evaluated for sleep apnea.  Patient states that she does not currently have a PCP.  She is a diabetic and has not been able to afford follow-up appointments with her PCP.  She states that she is almost out of her Metformin prescription.  Patient does not have insurance.  Patient denies any significant shortness of breath or any significant cough.Denies f/c/s, n/v/d, hemoptysis, PND, chest pain or edema.      No Known Allergies  Immunization History  Administered Date(s) Administered  . Influenza Split 04/18/2015  . Influenza,inj,Quad PF,6+ Mos 07/02/2019  . Tdap 03/15/2014    Past Medical History:  Diagnosis Date  . Anxiety   . Diabetes mellitus without complication (HCC)   . Hyperlipemia     Tobacco History: Social History   Tobacco Use  Smoking Status Never Smoker  Smokeless Tobacco Never Used   Counseling given: Not Answered   Outpatient Encounter Medications as of 01/03/2020  Medication Sig  . acetaminophen (TYLENOL) 500 MG tablet Take 500 mg by mouth every 6 (six) hours as needed for moderate pain.  . metFORMIN (GLUCOPHAGE) 500 MG tablet Take 1 tablet (500 mg total) by mouth 2 (two) times daily.  . [DISCONTINUED] ferrous gluconate (FERGON) 324 MG tablet Take 1 tablet (324 mg total) by mouth 2 (two) times  daily with a meal. (Patient not taking: Reported on 06/30/2019)  . [DISCONTINUED] metFORMIN (GLUCOPHAGE) 500 MG tablet Take 500 mg by mouth 2 (two) times daily.   No facility-administered encounter medications on file as of 01/03/2020.     Review of Systems  Review of Systems  Constitutional: Positive for fatigue.  HENT: Negative.   Respiratory: Negative for cough, shortness of breath and wheezing.   Cardiovascular: Positive for palpitations. Negative for chest pain and leg swelling.  Gastrointestinal: Negative.   Allergic/Immunologic: Negative.   Neurological: Positive for headaches.       Lower extremity pain  Psychiatric/Behavioral: Positive for decreased concentration.       Physical Exam  BP 122/80 (BP Location: Left Arm, Patient Position: Sitting, Cuff Size: Small)   Pulse 82   Temp 97.7 F (36.5 C)   Ht 5' (1.524 m)   Wt 168 lb (76.2 kg)   SpO2 98%   BMI 32.81 kg/m   Wt Readings from Last 5 Encounters:  01/03/20 168 lb (76.2 kg)  07/01/19 157 lb 14.4 oz (71.6 kg)  06/12/17 171 lb (77.6 kg)  05/07/17 172 lb (78 kg)  03/28/16 169 lb (76.7 kg)     Physical Exam Vitals and nursing note reviewed.  Constitutional:      General: She is not in acute distress.    Appearance: She is well-developed.  Cardiovascular:     Rate and Rhythm: Normal rate and regular rhythm.  Pulmonary:  Effort: Pulmonary effort is normal.     Breath sounds: Normal breath sounds.  Musculoskeletal:     Right lower leg: No edema.     Left lower leg: No edema.  Neurological:     Mental Status: She is alert and oriented to person, place, and time.  Psychiatric:        Mood and Affect: Mood normal.        Behavior: Behavior normal.        Assessment & Plan:   History of COVID-19 Irregular Heart rate Heart palpitations:  Will refer to cardiology  EKG showed NSR in office today - may need Zio monitor  Diabetes:  Will refill metformin  Will have patient call [facility] for PCP   Please Schedule appointment with:  [facility] [address] [place],  [place]  [address]  [phone]  Insomnia Snoring Memory loss Headaches:  Will place referral to neurology - may need cognitive assessment and may need sleep study   Fatigue:  Stay active  Start multivitamin  Stay well hydrated  Will order labs and call with results   Follow up:  Follow up in 2 months or sooner if needed      Fenton Foy, NP 01/03/2020

## 2020-01-25 ENCOUNTER — Encounter: Payer: Self-pay | Admitting: Neurology

## 2020-01-25 ENCOUNTER — Ambulatory Visit: Payer: Self-pay | Admitting: Neurology

## 2020-01-25 VITALS — BP 126/80 | HR 74 | Ht 62.5 in | Wt 165.0 lb

## 2020-01-25 DIAGNOSIS — J189 Pneumonia, unspecified organism: Secondary | ICD-10-CM

## 2020-01-25 DIAGNOSIS — R002 Palpitations: Secondary | ICD-10-CM

## 2020-01-25 DIAGNOSIS — U071 COVID-19: Secondary | ICD-10-CM

## 2020-01-25 DIAGNOSIS — R519 Headache, unspecified: Secondary | ICD-10-CM

## 2020-01-25 DIAGNOSIS — G4700 Insomnia, unspecified: Secondary | ICD-10-CM

## 2020-01-25 DIAGNOSIS — J1282 Pneumonia due to coronavirus disease 2019: Secondary | ICD-10-CM

## 2020-01-25 NOTE — Progress Notes (Signed)
SLEEP MEDICINE CLINIC    Provider:  Melvyn Novas, MD  Primary Care Physician:  System, Pcp Not In No address on file     Referring Provider: Ivonne Andrew, Np [address] [place],  [place] [address]          Chief Complaint according to patient   Patient presents with:    . New Patient (Initial Visit)     Alone, interpreter on the phone.       HISTORY OF PRESENT ILLNESS:  Cheryl Patrick is a 49 y.o. year old Hispanic female patient is seen here upon a referral on 01/25/2020 from PCP .  Chief concern according to patient : she contracted COVID 19 last year ( 11-24) and reportedly developed pain in hands and feet and couldn't sleep through the night, after 2 hours - with palpitations- couldn't concentrate in daytime" . "I have needles poking my feet. I am always tired- exhausted"   I have the pleasure of seeing Cheryl Patrick today, a right-handed Other or two or more races female with a possible sleep disorder.  She  has a past medical history of Anxiety, Diabetes mellitus without complication (HCC), and Hyperlipemia. had  Covid November- December 2020. Blood glucose was uncontrolled.   She has not been able to refill her metformin through her PCP after losing insurance, there could be a complication of poorly controlled blood glucose. The patient felt she has not lost control of DM .   Family medical /sleep history: No other family member on CPAP with OSA, insomnia, sleep walkers.    Social history: Patient is working as an Film/video editor, Audiological scientist-  and lives in a household with spouse and son recently left home. 9-5.30PM.  Tobacco use- none .   ETOH use, rarely Caffeine intake in form of Coffee( 1 coffee in AM ) Soda( 2 week) Tea (/) or energy drinks. Regular exercise- seldomly          Sleep habits are as follows: The patient's dinner time is between 7 PM. The patient goes to bed at 10.30 PM and goes to sleep by about 30 minute- she continues to sleep for 2 hours,  wakes for may be one bathroom break, but stays snoozing- not really deep sleep. The preferred sleep position is not-supine ( en spalda)-  and lateral , with the support of 1-2 pillows. Dreams are reportedly rare.  7 AM is the usual rise time.  She reports feeling refreshed / restored in AM, but within 3-4 hours I am tired again. symptoms such as dry mouth, morning headaches, and residual fatigue. Naps are taken infrequently.  Review of Systems: Out of a complete 14 system review, the patient complains of only the following symptoms, and all other reviewed systems are negative.:   Burning, pins  and needles -   Fatigue, sleepiness , snoring, fragmented sleep, Insomnia   How likely are you to doze in the following situations: 0 = not likely, 1 = slight chance, 2 = moderate chance, 3 = high chance   Sitting and Reading? Watching Television? Sitting inactive in a public place (theater or meeting)? As a passenger in a car for an hour without a break? Lying down in the afternoon when circumstances permit? Sitting and talking to someone? Sitting quietly after lunch without alcohol? In a car, while stopped for a few minutes in traffic?   Total = 13-14/ 24 points   FSS endorsed at 33/ 63 points.   Social History  Socioeconomic History  . Marital status: Married    Spouse name: Not on file  . Number of children: 3  . Years of education: Not on file  . Highest education level: Not on file  Occupational History  . Occupation: International aid/development worker: [employer]  Tobacco Use  . Smoking status: Never Smoker  . Smokeless tobacco: Never Used  Vaping Use  . Vaping Use: Never used  Substance and Sexual Activity  . Alcohol use: No  . Drug use: No  . Sexual activity: Never    Birth control/protection: None  Other Topics Concern  . Not on file  Social History Narrative  . Not on file   Social Determinants of Health   Financial Resource Strain:   . Difficulty of Paying Living  Expenses:   Food Insecurity:   . Worried About Programme researcher, broadcasting/film/video in the Last Year:   . Barista in the Last Year:   Transportation Needs:   . Freight forwarder (Medical):   Marland Kitchen Lack of Transportation (Non-Medical):   Physical Activity:   . Days of Exercise per Week:   . Minutes of Exercise per Session:   Stress:   . Feeling of Stress :   Social Connections:   . Frequency of Communication with Friends and Family:   . Frequency of Social Gatherings with Friends and Family:   . Attends Religious Services:   . Active Member of Clubs or Organizations:   . Attends Banker Meetings:   Marland Kitchen Marital Status:     Family History  Problem Relation Age of Onset  . Lymphoma Mother   . Hypertension Mother   . Diabetes Father     Past Medical History:  Diagnosis Date  . Anxiety   . Diabetes mellitus without complication (HCC)   . Hyperlipemia     Past Surgical History:  Procedure Laterality Date  . DILATION AND EVACUATION N/A 05/10/2017   Procedure: DILATATION AND EVACUATION;  Surgeon: [place] Bing, MD;  Location: [facility];  Service: Gynecology;  Laterality: N/A;  . NO PAST SURGERIES       Current Outpatient Medications on File Prior to Visit  Medication Sig Dispense Refill  . acetaminophen (TYLENOL) 500 MG tablet Take 500 mg by mouth every 6 (six) hours as needed for moderate pain.    . metFORMIN (GLUCOPHAGE) 500 MG tablet Take 1 tablet (500 mg total) by mouth 2 (two) times daily. 60 tablet 0   No current facility-administered medications on file prior to visit.    No Known Allergies  Physical exam:  Today's Vitals   01/25/20 0822  BP: 126/80  Pulse: 74  Weight: 165 lb (74.8 kg)  Height: 5' 2.5" (1.588 m)   Body mass index is 29.7 kg/m.   Wt Readings from Last 3 Encounters:  01/25/20 165 lb (74.8 kg)  01/03/20 168 lb (76.2 kg)  07/01/19 157 lb 14.4 oz (71.6 kg)     Ht Readings from Last 3 Encounters:  01/25/20 5' 2.5" (1.588 m)  01/03/20  5' (1.524 m)  07/01/19 5\' 2"  (1.575 m)      General: The patient is awake, alert and appears not in acute distress. The patient is well groomed. Head: Normocephalic, atraumatic. Neck is supple. Mallampati  3 plus ,  neck circumference:16. 5 inches . Nasal airflow patent.  Retrognathia is high grade- mouth is small.  Dental status: intact Cardiovascular:  Regular rate and cardiac rhythm by pulse,  without distended neck veins. Respiratory: Lungs are clear to auscultation.  Skin:  With= evidence of mild ankle edema, without any rash. Trunk: The patient's posture is erect.   Neurologic exam : The patient is awake and alert, oriented to place and time.   Memory subjective described as intact.  Attention span & concentration ability appears normal.  Speech is fluent,  without  dysarthria, dysphonia or aphasia.  Mood and affect are appropriate.   Cranial nerves: no loss of smell or taste reported  Pupils are equal and briskly reactive to light.  Funduscopic exam  deferred.  Extraocular movements in vertical and horizontal planes were intact and without nystagmus. No Diplopia. Visual fields by finger perimetry are intact. Hearing was intact to soft voice and finger rubbing.    Facial sensation intact to fine touch.  Facial motor strength is symmetric and tongue and uvula move midline.  Neck ROM : rotation, tilt and flexion extension were normal for age and shoulder shrug was symmetrical.    Motor exam:  Symmetric bulk, tone and ROM.  fingers reported as feeling puffy and stiff.  Normal tone without cog wheeling, symmetric grip strength .   Sensory:  Fine touch, pinprick and vibration were  normal.  Proprioception tested in the upper extremities was normal.   Coordination: Rapid alternating movements in the fingers/hands were of normal speed. Handwriting has changed.  The Finger-to-nose maneuver was intact without evidence of ataxia, dysmetria or tremor.   Gait and station: Patient  could rise unassisted from a seated position, walked without assistive device.  Stance is of normal width/ base and the patient turned with 3 steps.  Toe and heel walk were deferred.  Deep tendon reflexes: in the  upper and lower extremities are symmetric and intact.  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  3) snoring, upper airway narrowing.    My Plan is to proceed with:  1) HST to screen for apnea.  She has not had any of the 01-03-2020 ordered blood tests due to financial limitations.  Marland Kitchen  2) If HST is too expensive, will order an ONO only as a screening tool.  3) [facility]  I would like to thank System, Pcp Not In and Ivonne Andrew, Np [address] [place],  [place] [address] for allowing me to meet with and to take care of this pleasant patient.   I I plan to follow up  through our NP within 2 month.   CC: I will share my notes with PCP .  Electronically signed by: Melvyn Novas, MD 01/25/2020 9:08 AM  [facility] Board certified by The [employer] of Sleep Medicine and Diplomate of the [employer] of Sleep Medicine. Board certified In Neurology through the ABPN, Fellow of the [employer] of Neurology. Medical Director of [facility].

## 2020-01-25 NOTE — Patient Instructions (Signed)
Apnea del sueo Sleep Apnea La apnea del sueo afecta la respiracin mientras se duerme. Hace que la respiracin se detenga por poco tiempo o se vuelva superficial. Tambin puede aumentar el riesgo de:  Infarto de miocardio.  Accidente cerebrovascular.  Tener mucho sobrepeso (obesidad).  Diabetes.  Insuficiencia cardaca.  Latidos cardacos irregulares. [place] del tratamiento es ayudarle a respirar normalmente otra vez. Cules son las causas? Existen tres tipos de apnea del sueo:  Apnea obstructiva del sueo. Esta ocurre cuando las vas respiratorias se obstruyen o colapsan.  Apnea central del sueo. Esta ocurre cuando el cerebro no enva las seales correctas a los msculos que controlan la respiracin.  Apnea mixta del sueo. Esta es una combinacin de apnea obstructiva y central del sueo. La causa ms frecuente de esta afeccin es la obstruccin o el colapso de las vas respiratorias. Esto puede suceder si:  Los msculos de la garganta estn demasiado relajados.  Tiene la lengua y las [address].  Tiene sobrepeso.  Tiene las vas respiratorias demasiado pequeas. Qu incrementa el riesgo?  Tener sobrepeso.  Fumar.  Tener vas respiratorias pequeas.  El envejecimiento.  Ser hombre.  El consumo de alcohol.  Tomar medicamentos para calmarse (sedantes o tranquilizantes).  Tener familiares con esta afeccin. Cules son los signos o los sntomas?  Dificultad para permanecer dormido.  Estar somnoliento o cansado Administrator.  Enojarse mucho.  Ronquidos fuertes.  Dolor de cabeza por la maana.  Imposibilidad de enfocar la mente (concentrarse).  Olvidar cosas.  Menos inters por el sexo.  Cambios en el estado de nimo.  Cambios en la personalidad.  Sentimientos de tristeza (depresin).  Levantarse mucho durante la noche para ir a Geographical information systems officer.  Sequedad en la boca.  Dolor de Advertising copywriter. Cmo se diagnostica?  Sus  antecedentes mdicos.  Un examen fsico.  Neomia Dear prueba que se realiza mientras la persona duerme (estudio del sueo). La prueba se realiza con mayor frecuencia en un laboratorio del sueo, pero tambin puede Management consultant. Cmo se trata?   Dormir de Mudlogger.  Usar un medicamento para eliminar la mucosidad de la nariz (descongestivo).  Evitar el consumo de alcohol, medicamentos que ayudan a relajarse o ciertos analgsicos (narcticos).  Bajar de [place], si es necesario.  Cambios en la dieta.  No fumar.  Usar una mquina para abrir las vas respiratorias mientras duerme; por ejemplo: ? Un aparato bucal. Se trata de una boquilla que desplaza la mandbula hacia adelante. ? Un dispositivo CPAP. Este dispositivo sopla aire a travs de una mscara cuando usted exhala. ? Un dispositivo EPAP. Este tiene vlvulas que se colocan en cada fosa nasal. ? Un dispositivo BPAP. Este dispositivo sopla aire a travs de una mscara cuando usted inhala y exhala.  Someterse a Biomedical engineer tratamientos no Comptroller. Realizar un tratamiento para la apnea del sueo es importante. Sin tratamiento, esta afeccin puede derivar en lo siguiente:  Presin arterial alta.  Arteriopata coronaria.  En los hombres, no poder tener una ereccin (impotencia).  Reduccin de la capacidad de pensar. Siga estas instrucciones en su casa: Estilo de [employer] cambios que le haya recomendado el mdico.  Siga una dieta saludable.  Baje de peso, si es necesario.  Evite el alcohol, los medicamentos para relajarse y Scientific laboratory technician.  No consuma ningn producto que contenga nicotina o tabaco, como cigarrillos, cigarrillos electrnicos y tabaco de Theatre manager. Si necesita ayuda para dejar de fumar, consulte al mdico. Instrucciones generales  Tome los medicamentos de venta libre y los recetados solamente como se lo haya indicado el mdico.  Si le proporcionaron una mquina para usar mientras  duerme, sela solamente como se lo haya indicado el mdico.  Si va a someterse a [place], no olvide informarle al mdico que tiene apnea del sueo. Puede ser necesario que lleve su dispositivo consigo.  Concurra a todas las visitas de [address] se lo haya indicado el mdico. Esto es importante. Comunquese con un mdico si:  El Astronomer para usar mientras duerme le [place] o parece no funcionar.  No se siente mejor.  Empeora. Solicite ayuda inmediatamente si:  Le duele el pecho.  Tiene dificultad para inhalar suficiente aire.  Tiene molestias en la espalda, en los brazos o en el [place].  Tiene dificultad para hablar.  Siente debilidad en un lado del cuerpo.  Se le cae un lado de la cara. Estos sntomas pueden Customer service manager. No espere a ver si los sntomas desaparecen. Solicite atencin mdica de inmediato. Comunquese con el servicio de emergencias de su localidad (911 en los [place]). No conduzca por sus propios medios [employer] [facility]. Resumen  Esta afeccin afecta la respiracin durante el sueo.  La causa ms frecuente es la obstruccin o el colapso de las vas respiratorias.  El [place] del tratamiento es ayudarlo a respirar normalmente mientras duerme. Esta informacin no tiene Theme park manager el consejo del mdico. Asegrese de hacerle al mdico cualquier pregunta que tenga. Document Revised: 04/08/2018 Document Reviewed: 04/08/2018 Elsevier Patient Education  2020 [employer].

## 2020-01-27 NOTE — Progress Notes (Signed)
Cardiology Office Note:   Date:  01/28/2020  NAME:  Cheryl Patrick    MRN: [id number] DOB:  [birth year]   PCP:  System, Pcp Not In  Cardiologist:  No primary care provider on file.   Referring MD: Ivonne Andrew, NP   Chief Complaint  Patient presents with  . Palpitations   History of Present Illness:   Cheryl Patrick is a 49 y.o. female with a hx of DM, HLD, anxiety who is being seen today for the evaluation of palpitations/SOB at the request of Ivonne Andrew, NP.  She reports she was diagnosed with coronavirus pneumonia in December 2020.  Since that time she is experienced episodes of palpitations and shortness of breath.  She reports she is awoken in the night up to 3 times per week with rapid heartbeat sensation.  She feels that her heart is in a beat out of her chest.  She reports the symptoms last 15 minutes.  No identifiable trigger.  She reports she takes deep breaths and drinks water and the symptoms resolved.  She also endorses shortness of breath when she exerts herself.  She does work in Investment banker, operational.  She reports she exercises 3 times per week.  She walks on a treadmill for about 30 minutes.  She can get exertional shortness of breath with this.  She also endorses occasional tightness in her chest.  She is followed by the Cov[facility] clinic.  She does report that she snores and she does have excess fatigue.  She reports has had a depressed mood since coronavirus.  She also endorses symptoms of memory loss.  She reports that she is just sad and depressed since coronavirus.  She is a never smoker.  Does not consume alcohol.  No illicit drug use is reported.  She is never had any cardiac issues that I can tell.  CVD risk factors include diabetes.  A1c 7.0.  She is not had any cardiac work-up in the past.  Overall she is concerned about the health of her heart.  Her EKG demonstrates normal sinus rhythm with no acute ST-T changes.  She is no evidence of prior infarction.  Problem List 1.  DM -a1c 7.0 2. HLD 3. Anxiety  Past Medical History: Past Medical History:  Diagnosis Date  . Anxiety   . Diabetes mellitus without complication (HCC)   . Hyperlipemia     Past Surgical History: Past Surgical History:  Procedure Laterality Date  . DILATION AND EVACUATION N/A 05/10/2017   Procedure: DILATATION AND EVACUATION;  Surgeon: Converse Bing, MD;  Location: [facility];  Service: Gynecology;  Laterality: N/A;  . NO PAST SURGERIES      Current Medications: Current Meds  Medication Sig  . acetaminophen (TYLENOL) 500 MG tablet Take 500 mg by mouth every 6 (six) hours as needed for moderate pain.  . metFORMIN (GLUCOPHAGE) 500 MG tablet Take 1 tablet (500 mg total) by mouth 2 (two) times daily.     Allergies:    Patient has no known allergies.   Social History: Social History   Socioeconomic History  . Marital status: Married    Spouse name: Not on file  . Number of children: 3  . Years of education: Not on file  . Highest education level: Not on file  Occupational History  . Occupation: International aid/development worker: [employer]  Tobacco Use  . Smoking status: Never Smoker  . Smokeless tobacco: Never Used  Vaping Use  . Vaping  Use: Never used  Substance and Sexual Activity  . Alcohol use: No  . Drug use: No  . Sexual activity: Never    Birth control/protection: None  Other Topics Concern  . Not on file  Social History Narrative  . Not on file   Social Determinants of Health   Financial Resource Strain:   . Difficulty of Paying Living Expenses:   Food Insecurity:   . Worried About Programme researcher, broadcasting/film/videounning Out of Food in the Last Year:   . Baristaan Out of Food in the Last Year:   Transportation Needs:   . Freight forwarderLack of Transportation (Medical):   Marland Kitchen. Lack of Transportation (Non-Medical):   Physical Activity:   . Days of Exercise per Week:   . Minutes of Exercise per Session:   Stress:   . Feeling of Stress :   Social Connections:   . Frequency of Communication with Friends and  Family:   . Frequency of Social Gatherings with Friends and Family:   . Attends Religious Services:   . Active Member of Clubs or Organizations:   . Attends BankerClub or Organization Meetings:   Marland Kitchen. Marital Status:      Family History: The patient's family history includes Diabetes in her father and mother; Hypertension in her father and mother; Lymphoma in her mother.  ROS:   All other ROS reviewed and negative. Pertinent positives noted in the HPI.     EKGs/Labs/Other Studies Reviewed:   The following studies were personally reviewed by me today:  EKG:  EKG is ordered today.  The ekg ordered today demonstrates normal sinus rhythm, heart rate 79, no acute ST-T changes, no evidence of prior infarction, and was personally reviewed by me.   Recent Labs: 07/04/2019: ALT 28; B Natriuretic Peptide 99.5; BUN 25; Creatinine, Ser 0.67; Hemoglobin 11.9; Magnesium 2.2; Platelets 267; Potassium 4.2; Sodium 136   Recent Lipid Panel    Component Value Date/Time   CHOL 149 11/09/2012 1247   TRIG 191 (H) 11/09/2012 1247   HDL 36 (L) 11/09/2012 1247   CHOLHDL 4.1 11/09/2012 1247   VLDL 38 11/09/2012 1247   LDLCALC 75 11/09/2012 1247    Physical Exam:   VS:  BP 132/82   Pulse 79   Temp (!) 97.3 F (36.3 C)   Ht 5\' 2"  (1.575 m)   Wt 165 lb 6.4 oz (75 kg)   SpO2 96%   BMI 30.25 kg/m    Wt Readings from Last 3 Encounters:  01/28/20 165 lb 6.4 oz (75 kg)  01/25/20 165 lb (74.8 kg)  01/03/20 168 lb (76.2 kg)    General: Well nourished, well developed, in no acute distress Heart: Atraumatic, normal size  Eyes: PEERLA, EOMI  Neck: Supple, no JVD Endocrine: No thryomegaly Cardiac: Normal S1, S2; RRR; no murmurs, rubs, or gallops Lungs: Clear to auscultation bilaterally, no wheezing, rhonchi or rales  Abd: Soft, nontender, no hepatomegaly  Ext: No edema, pulses 2+ Musculoskeletal: No deformities, BUE and BLE strength normal and equal Skin: Warm and dry, no rashes   Neuro: Alert and oriented  to person, place, time, and situation, CNII-XII grossly intact, no focal deficits  Psych: Normal mood and affect   ASSESSMENT:   Woodward KuMaria Amundson is a 49 y.o. female who presents for the following: 1. Palpitations   2. SOB (shortness of breath)   3. Mixed hyperlipidemia     PLAN:   1. Palpitations 2. SOB (shortness of breath) -Intermittent episodes of palpitations and shortness of breath.  Could  be having an arrhythmia.  No evidence of heart failure on exam.  EKG is normal today.  I have low suspicion for effective CAD.  Her chest pain is atypical.  Her shortness of breath is also atypical.  We will proceed with a TSH and BNP today.  Her BNP values have been normal in the past.  We will make sure this is not heart failure.  I would like for her to get an echocardiogram just to ensure she has no heart damage from coronavirus.  We have not seen any of this.  I have a low suspicion for anything given her normal EKG.  We will need to exclude an arrhythmia today.  We will proceed with a 7-day Zio patch.  I will see her back in 3 months after this.  3. Mixed hyperlipidemia -She needs to establish with a primary care physician.  Needs to be on statin due to diabetes.  Disposition: Return in about 3 months (around 04/29/2020).  Medication Adjustments/Labs and Tests Ordered: Current medicines are reviewed at length with the patient today.  Concerns regarding medicines are outlined above.  Orders Placed This Encounter  Procedures  . TSH  . Brain natriuretic peptide  . LONG TERM MONITOR (3-14 DAYS)  . EKG 12-Lead  . ECHOCARDIOGRAM COMPLETE   No orders of the defined types were placed in this encounter.   Patient Instructions  Medication Instructions:  The current medical regimen is effective;  continue present plan and medications.  *If you need a refill on your cardiac medications before your next appointment, please call your pharmacy*   Lab Work: BNP, TSH today   If you have labs  (blood work) drawn today and your tests are completely normal, you will receive your results only by: Marland Kitchen MyChart Message (if you have MyChart) OR . A paper copy in the mail If you have any lab test that is abnormal or we need to change your treatment, we will call you to review the results.   Testing/Procedures: Echocardiogram - Your physician has requested that you have an echocardiogram. Echocardiography is a painless test that uses sound waves to create images of your heart. It provides your doctor with information about the size and shape of your heart and how well your heart's chambers and valves are working. This procedure takes approximately one hour. There are no restrictions for this procedure. This will be performed at our [facility] location - [address].  Your physician has recommended that you wear a 7 DAY ZIO-PATCH monitor. The Zio patch cardiac monitor continuously records heart rhythm data for up to 14 days, this is for patients being evaluated for multiple types heart rhythms. For the first 24 hours post application, please avoid getting the Zio monitor wet in the shower or by excessive sweating during exercise. After that, feel free to carry on with regular activities. Keep soaps and lotions away from the ZIO XT Patch.  This will be mailed to you, please expect 7-10 days to receive.          Follow-Up: At [facility], you and your health needs are our priority.  As part of our continuing mission to provide you with exceptional heart care, we have created designated Provider Care Teams.  These Care Teams include your primary Cardiologist (physician) and Advanced Practice Providers (APPs -  Physician Assistants and Nurse Practitioners) who all work together to provide you with the care you need, when you need it.  We recommend signing up for the patient portal called "MyChart".  Sign up information is provided on this After Visit Summary.  MyChart is used to  connect with patients for Virtual Visits (Telemedicine).  Patients are able to view lab/test results, encounter notes, upcoming appointments, etc.  Non-urgent messages can be sent to your provider as well.   To learn more about what you can do with MyChart, go to [email or web address].    Your next appointment:   3 month(s)  The format for your next appointment:   In Person  Provider:   Lennie Odor, MD         Signed, Lenna Gilford. Flora Lipps, MD [facility]  [address], [address] [place], [place] [address] [phone]  01/28/2020 9:58 AM

## 2020-01-28 ENCOUNTER — Ambulatory Visit (INDEPENDENT_AMBULATORY_CARE_PROVIDER_SITE_OTHER): Payer: Self-pay | Admitting: Cardiovascular Disease

## 2020-01-28 ENCOUNTER — Other Ambulatory Visit: Payer: Self-pay

## 2020-01-28 ENCOUNTER — Encounter: Payer: Self-pay | Admitting: Cardiovascular Disease

## 2020-01-28 VITALS — BP 132/82 | HR 79 | Temp 97.3°F | Ht 62.0 in | Wt 165.4 lb

## 2020-01-28 DIAGNOSIS — R0602 Shortness of breath: Secondary | ICD-10-CM

## 2020-01-28 DIAGNOSIS — E782 Mixed hyperlipidemia: Secondary | ICD-10-CM

## 2020-01-28 DIAGNOSIS — R002 Palpitations: Secondary | ICD-10-CM

## 2020-01-28 NOTE — Patient Instructions (Signed)
Medication Instructions:  The current medical regimen is effective;  continue present plan and medications.  *If you need a refill on your cardiac medications before your next appointment, please call your pharmacy*   Lab Work: BNP, TSH today   If you have labs (blood work) drawn today and your tests are completely normal, you will receive your results only by: Marland Kitchen MyChart Message (if you have MyChart) OR . A paper copy in the mail If you have any lab test that is abnormal or we need to change your treatment, we will call you to review the results.   Testing/Procedures: Echocardiogram - Your physician has requested that you have an echocardiogram. Echocardiography is a painless test that uses sound waves to create images of your heart. It provides your doctor with information about the size and shape of your heart and how well your heart's chambers and valves are working. This procedure takes approximately one hour. There are no restrictions for this procedure. This will be performed at our [facility] location - [address].  Your physician has recommended that you wear a 7 DAY ZIO-PATCH monitor. The Zio patch cardiac monitor continuously records heart rhythm data for up to 14 days, this is for patients being evaluated for multiple types heart rhythms. For the first 24 hours post application, please avoid getting the Zio monitor wet in the shower or by excessive sweating during exercise. After that, feel free to carry on with regular activities. Keep soaps and lotions away from the ZIO XT Patch.  This will be mailed to you, please expect 7-10 days to receive.          Follow-Up: At [facility], you and your health needs are our priority.  As part of our continuing mission to provide you with exceptional heart care, we have created designated Provider Care Teams.  These Care Teams include your primary Cardiologist (physician) and Advanced Practice Providers (APPs -   Physician Assistants and Nurse Practitioners) who all work together to provide you with the care you need, when you need it.  We recommend signing up for the patient portal called "MyChart".  Sign up information is provided on this After Visit Summary.  MyChart is used to connect with patients for Virtual Visits (Telemedicine).  Patients are able to view lab/test results, encounter notes, upcoming appointments, etc.  Non-urgent messages can be sent to your provider as well.   To learn more about what you can do with MyChart, go to [email or web address].    Your next appointment:   3 month(s)  The format for your next appointment:   In Person  Provider:   Lennie Odor, MD

## 2020-01-29 LAB — BRAIN NATRIURETIC PEPTIDE: BNP: 7.3 pg/mL (ref 0.0–100.0)

## 2020-01-29 LAB — TSH: TSH: 4.53 u[IU]/mL — ABNORMAL HIGH (ref 0.450–4.500)

## 2020-02-01 ENCOUNTER — Encounter: Payer: Self-pay | Admitting: *Deleted

## 2020-02-01 NOTE — Progress Notes (Signed)
Patient ID: Cheryl Patrick, female   DOB: [birth year], 49 y.o.   MRN: [id number] Patient enrolled for [employer] to ship a 7 day ZIO XT to her home.  Request Spanish instructions to be sent to patient.

## 2020-02-05 ENCOUNTER — Ambulatory Visit (INDEPENDENT_AMBULATORY_CARE_PROVIDER_SITE_OTHER): Payer: Self-pay

## 2020-02-05 DIAGNOSIS — R002 Palpitations: Secondary | ICD-10-CM

## 2020-02-06 ENCOUNTER — Encounter: Payer: Self-pay | Admitting: Neurology

## 2020-02-09 ENCOUNTER — Other Ambulatory Visit: Payer: Self-pay | Admitting: *Deleted

## 2020-02-09 ENCOUNTER — Other Ambulatory Visit: Payer: Self-pay

## 2020-02-09 ENCOUNTER — Ambulatory Visit: Payer: Self-pay | Attending: Nurse Practitioner | Admitting: Nurse Practitioner

## 2020-02-09 ENCOUNTER — Encounter: Payer: Self-pay | Admitting: Nurse Practitioner

## 2020-02-09 VITALS — Ht 62.0 in | Wt 165.0 lb

## 2020-02-09 DIAGNOSIS — E785 Hyperlipidemia, unspecified: Secondary | ICD-10-CM

## 2020-02-09 DIAGNOSIS — Z1231 Encounter for screening mammogram for malignant neoplasm of breast: Secondary | ICD-10-CM

## 2020-02-09 DIAGNOSIS — E1165 Type 2 diabetes mellitus with hyperglycemia: Secondary | ICD-10-CM

## 2020-02-09 DIAGNOSIS — R399 Unspecified symptoms and signs involving the genitourinary system: Secondary | ICD-10-CM

## 2020-02-09 DIAGNOSIS — F32A Depression, unspecified: Secondary | ICD-10-CM

## 2020-02-09 DIAGNOSIS — F419 Anxiety disorder, unspecified: Secondary | ICD-10-CM

## 2020-02-09 DIAGNOSIS — Z130 Encounter for screening for diseases of the blood and blood-forming organs and certain disorders involving the immune mechanism: Secondary | ICD-10-CM

## 2020-02-09 DIAGNOSIS — F329 Major depressive disorder, single episode, unspecified: Secondary | ICD-10-CM

## 2020-02-09 MED ORDER — ATORVASTATIN CALCIUM 20 MG PO TABS: 20.0000 mg | ORAL_TABLET | Freq: Every day | ORAL | 3 refills | Status: DC

## 2020-02-09 MED ORDER — METFORMIN HCL 500 MG PO TABS: 500.0000 mg | ORAL_TABLET | Freq: Two times a day (BID) | ORAL | 0 refills | Status: DC

## 2020-02-09 NOTE — Progress Notes (Signed)
Virtual Visit via Telephone Note Due to national recommendations of social distancing due to [place] 19, telehealth visit is felt to be most appropriate for this patient at this time.  I discussed the limitations, risks, security and privacy concerns of performing an evaluation and management service by telephone and the availability of in person appointments. I also discussed with the patient that there may be a patient responsible charge related to this service. The patient expressed understanding and agreed to proceed.    I connected with Cheryl Patrick on 02/09/20  at   8:50 AM EDT  EDT by telephone and verified that I am speaking with the correct person using two identifiers.   Consent I discussed the limitations, risks, security and privacy concerns of performing an evaluation and management service by telephone and the availability of in person appointments. I also discussed with the patient that there may be a patient responsible charge related to this service. The patient expressed understanding and agreed to proceed.   Location of Patient: Private Residence   Location of Provider: [place] and [place] participating in Telemedicine visit: [name] FNP-BC [place] Spanish interpreter [id number]   History of Present Illness: Telemedicine visit for: Establish Care Patient has been counseled on age-appropriate routine health concerns for screening and prevention. These are reviewed and up-to-date. Referrals have been placed accordingly. Immunizations are up-to-date or declined.    Mammogram:Overdue. Referred to Breast clinic   PAP smear: overdue. Scheduled for next office visit Ophthalmology: Overdue. Referral placed  DM TYPE 2 Diagnosed 3 years ago. Monitoring blood glucose levels infrequently.  Average readings 130-140s. Highest 170s. Taking metformin 500 mg BID. Needs low dose statin. Will order script for lipitor low  dose today Lab Results  Component Value Date   HGBA1C 7.0 (H) 07/02/2019   Lab Results  Component Value Date   LDLCALC 75 11/09/2012   BP Readings from Last 3 Encounters:  01/28/20 132/82  01/25/20 126/80  01/03/20 122/80   Notes frequency and urgency of urination. Onset 3 months. Denies any other GU symptoms.    History of Anxiety and Depression Feels she has been depressed since she was diagnosed with COVID. Associated symptoms fatigue, headaches, palpitations, SHOB. She is currently seeing Cardiology for the palpitations.Plan if for 7 day ZIO patch and f/u in 3 months.  At this time she is agreeable to complete cardiac workup and then we will revisit her depression and anxiety and decide if SSRI or anxiolytic will be beneficial.  Depression screen [facility] 2/9 02/09/2020 07/13/2019 06/12/2017 03/28/2016 07/06/2015  Decreased Interest 3 0 3 0 0  Down, Depressed, Hopeless 3 0 2 0 0  PHQ - 2 Score 6 0 5 0 0  Altered sleeping 2 0 3 - -  Tired, decreased energy 1 0 3 - -  Change in appetite 1 0 0 - -  Feeling bad or failure about yourself  2 0 2 - -  Trouble concentrating 0 0 0 - -  Moving slowly or fidgety/restless 0 0 0 - -  Suicidal thoughts 0 0 0 - -  PHQ-9 Score 12 0 13 - -    GAD 7 : Generalized Anxiety Score 02/09/2020 07/13/2019 06/12/2017  Nervous, Anxious, on Edge 3 0 1  Control/stop worrying 0 0 0  Worry too much - different things 2 0 1  Trouble relaxing 2 0 2  Restless 2 0 0  Easily annoyed or irritable 3  0 1  Afraid - awful might happen 1 0 0  Total GAD 7 Score 13 0 5     Past Medical History:  Diagnosis Date   Anxiety    Diabetes mellitus without complication ([place])    Hyperlipemia     Past Surgical History:  Procedure Laterality Date   DILATION AND EVACUATION N/A 05/10/2017   Procedure: DILATATION AND EVACUATION;  Surgeon: Aletha Halim, MD;  Location: [facility];  Service: Gynecology;  Laterality: N/A;   NO PAST SURGERIES      Family History  Problem  Relation Age of Onset   Lymphoma Mother    Hypertension Mother    Diabetes Mother    Diabetes Father    Hypertension Father     Social History   Socioeconomic History   Marital status: Married    Spouse name: Not on file   Number of children: 3   Years of education: Not on file   Highest education level: Not on file  Occupational History   Occupation: Landscape architect: [employer]  Tobacco Use   Smoking status: Never Smoker   Smokeless tobacco: Never Used  Scientific laboratory technician Use: Never used  Substance and Sexual Activity   Alcohol use: No   Drug use: No   Sexual activity: Yes    Birth control/protection: None  Other Topics Concern   Not on file  Social History Narrative   Not on file   Social Determinants of Health   Financial Resource Strain:    Difficulty of Paying Living Expenses:   Food Insecurity:    Worried About Charity fundraiser in the Last Year:    Arboriculturist in the Last Year:   Transportation Needs:    Film/video editor (Medical):    Lack of Transportation (Non-Medical):   Physical Activity:    Days of Exercise per Week:    Minutes of Exercise per Session:   Stress:    Feeling of Stress :   Social Connections:    Frequency of Communication with Friends and Family:    Frequency of Social Gatherings with Friends and Family:    Attends Religious Services:    Active Member of Clubs or Organizations:    Attends Music therapist:    Marital Status:      Observations/Objective: Awake, alert and oriented x 3   Review of Systems  Constitutional: Negative for fever, malaise/fatigue and weight loss.  HENT: Negative.  Negative for nosebleeds.   Eyes: Negative.  Negative for blurred vision, double vision and photophobia.  Respiratory: Negative.  Negative for cough and shortness of breath.   Cardiovascular: Positive for palpitations. Negative for chest pain and leg swelling.   Gastrointestinal: Negative.  Negative for heartburn, nausea and vomiting.  Genitourinary: Positive for frequency and urgency.  Musculoskeletal: Negative.  Negative for myalgias.  Neurological: Negative.  Negative for dizziness, focal weakness, seizures and headaches.  Psychiatric/Behavioral: Positive for depression. Negative for suicidal ideas. The patient is nervous/anxious.     Assessment and Plan: Cheryl Patrick was seen today for new patient (initial visit).  Diagnoses and all orders for this visit:  Type 2 diabetes mellitus with hyperglycemia, without long-term current use of insulin (HCC) -     metFORMIN (GLUCOPHAGE) 500 MG tablet; Take 1 tablet (500 mg total) by mouth 2 (two) times daily. -     Ambulatory referral to Ophthalmology -     Microalbumin / creatinine urine  ratio -     Hemoglobin A1c -     CMP14+EGFR Continue blood sugar control as discussed in office today, low carbohydrate diet, and regular physical exercise as tolerated, 150 minutes per week (30 min each day, 5 days per week, or 50 min 3 days per week). Keep blood sugar logs with fasting goal of 90-130 mg/dl, post prandial (after you eat) less than 180.  For Hypoglycemia: BS <60 and Hyperglycemia BS >400; contact the clinic ASAP. Annual eye exams and foot exams are recommended.   Screening for deficiency anemia -     CBC  UTI symptoms -     POCT URINALYSIS DIP (CLINITEK)  Dyslipidemia, goal LDL below 70 -     atorvastatin (LIPITOR) 20 MG tablet; Take 1 tablet (20 mg total) by mouth daily. INSTRUCTIONS: Work on a low fat, heart healthy diet and participate in regular aerobic exercise program by working out at least 150 minutes per week; 5 days a week-30 minutes per day. Avoid red meat/beef/steak,  fried foods. junk foods, sodas, sugary drinks, unhealthy snacking, alcohol and smoking.  Drink at least 80 oz of water per day and monitor your carbohydrate intake daily.    Anxiety and depression Declines SSRI today    Follow Up Instructions Return for PAP SMEAR.     I discussed the assessment and treatment plan with the patient. The patient was provided an opportunity to ask questions and all were answered. The patient agreed with the plan and demonstrated an understanding of the instructions.   The patient was advised to call back or seek an in-person evaluation if the symptoms worsen or if the condition fails to improve as anticipated.  I provided 18 minutes of non-face-to-face time during this encounter including median intraservice time, reviewing previous notes, labs, imaging, medications and explaining diagnosis and management.  Gildardo Pounds, FNP-BC

## 2020-02-14 ENCOUNTER — Telehealth: Payer: Self-pay | Admitting: Neurology

## 2020-02-14 NOTE — Telephone Encounter (Signed)
Received the patient's Overnight oximetry test results. The patient had this completed on room air to see how her oxygen level was during her sleep. I will have Dr Frances Furbish review in Dr Dohmeier's absence the data and then contact the patient with results.

## 2020-02-14 NOTE — Telephone Encounter (Signed)
Please call patient regarding her overnight pulse oximetry test from 02/06/2020.  Test was done on room air, total duration of testing time was quite long, 23 hours and 22 minutes.  Please verify that the patient had the pulse oximetry for this long, according to the data, start date was 02/06/2020, start and was 02/09/2020, which would be more than 2 days.  Average oxygen saturation 95%, nadir was 83%.  Time below are at 88% saturation was 9 minutes and 37 seconds.  She does have desaturations during sleep, may be also some desaturations during wakefulness.  Since the testing time was so long it is hard to say what is causing the desaturations.  I recommend, if there is concern for underlying obstructive sleep apnea that the patient consider a sleep study.  I would be happy to order this in Dr. Oliva Bustard absence.

## 2020-02-15 ENCOUNTER — Other Ambulatory Visit: Payer: Self-pay

## 2020-02-15 ENCOUNTER — Ambulatory Visit: Payer: Self-pay | Attending: Nurse Practitioner

## 2020-02-15 DIAGNOSIS — R399 Unspecified symptoms and signs involving the genitourinary system: Secondary | ICD-10-CM

## 2020-02-15 LAB — POCT URINALYSIS DIP (CLINITEK)
Bilirubin, UA: NEGATIVE
Blood, UA: NEGATIVE
Glucose, UA: NEGATIVE mg/dL
Ketones, POC UA: NEGATIVE mg/dL
Leukocytes, UA: NEGATIVE
Nitrite, UA: NEGATIVE
POC PROTEIN,UA: NEGATIVE
Spec Grav, UA: 1.025 (ref 1.010–1.025)
Urobilinogen, UA: 0.2 E.U./dL
pH, UA: 5 (ref 5.0–8.0)

## 2020-02-16 LAB — CMP14+EGFR
ALT: 20 IU/L (ref 0–32)
AST: 23 IU/L (ref 0–40)
Albumin/Globulin Ratio: 1.5 (ref 1.2–2.2)
Albumin: 4.6 g/dL (ref 3.8–4.8)
Alkaline Phosphatase: 97 IU/L (ref 48–121)
BUN/Creatinine Ratio: 16 (ref 9–23)
BUN: 12 mg/dL (ref 6–24)
Bilirubin Total: 0.3 mg/dL (ref 0.0–1.2)
CO2: 21 mmol/L (ref 20–29)
Calcium: 9.5 mg/dL (ref 8.7–10.2)
Chloride: 104 mmol/L (ref 96–106)
Creatinine, Ser: 0.74 mg/dL (ref 0.57–1.00)
GFR calc Af Amer: 111 mL/min/{1.73_m2} (ref >59)
GFR calc non Af Amer: 96 mL/min/{1.73_m2} (ref >59)
Globulin, Total: 3 g/dL (ref 1.5–4.5)
Glucose: 166 mg/dL — ABNORMAL HIGH (ref 65–99)
Potassium: 4.1 mmol/L (ref 3.5–5.2)
Sodium: 141 mmol/L (ref 134–144)
Total Protein: 7.6 g/dL (ref 6.0–8.5)

## 2020-02-16 LAB — CBC
Hematocrit: 43.6 % (ref 34.0–46.6)
Hemoglobin: 14.3 g/dL (ref 11.1–15.9)
MCH: 27.3 pg (ref 26.6–33.0)
MCHC: 32.8 g/dL (ref 31.5–35.7)
MCV: 83 fL (ref 79–97)
Platelets: 210 10*3/uL (ref 150–450)
RBC: 5.24 x10E6/uL (ref 3.77–5.28)
RDW: 13.2 % (ref 11.7–15.4)
WBC: 6.6 10*3/uL (ref 3.4–10.8)

## 2020-02-16 LAB — MICROALBUMIN / CREATININE URINE RATIO
Creatinine, Urine: 69 mg/dL
Microalb/Creat Ratio: 10 mg/g creat (ref 0–29)
Microalbumin, Urine: 6.6 ug/mL

## 2020-02-16 LAB — HEMOGLOBIN A1C
Est. average glucose Bld gHb Est-mCnc: 148 mg/dL
Hgb A1c MFr Bld: 6.8 % — ABNORMAL HIGH (ref 4.8–5.6)

## 2020-02-17 NOTE — Telephone Encounter (Signed)
Pt returned call and I had to call back with interpreter. I was able to call back and review the results with the patient through the interpreter. Advised that Dr Vickey Huger is out on vacation and that when she returns from vacation I will be in touch with what she would recommend the next steps should be. With the patient not having insurance I know this was the reason we completed this first. Will wait for Dr Dohmeier's next steps and be in touch with the patient. She verbalized understanding and had no questions.

## 2020-02-17 NOTE — Telephone Encounter (Signed)
Called the patient using the interpreter Roxine Caddy [phone]. There was no answer LVM asking the patient to call back.

## 2020-02-22 ENCOUNTER — Other Ambulatory Visit: Payer: Self-pay

## 2020-02-22 ENCOUNTER — Ambulatory Visit (HOSPITAL_COMMUNITY): Payer: Self-pay | Attending: Cardiology

## 2020-02-22 DIAGNOSIS — R002 Palpitations: Secondary | ICD-10-CM | POA: Insufficient documentation

## 2020-02-22 LAB — ECHOCARDIOGRAM COMPLETE
Area-P 1/2: 3.08 cm2
S' Lateral: 2.4 cm

## 2020-02-28 ENCOUNTER — Telehealth: Payer: Self-pay | Admitting: Nurse Practitioner

## 2020-02-29 NOTE — Telephone Encounter (Signed)
Completed.

## 2020-03-06 ENCOUNTER — Ambulatory Visit: Payer: Self-pay

## 2020-03-09 ENCOUNTER — Ambulatory Visit (INDEPENDENT_AMBULATORY_CARE_PROVIDER_SITE_OTHER): Payer: Self-pay | Admitting: Nurse Practitioner

## 2020-03-09 DIAGNOSIS — Z8616 Personal history of COVID-19: Secondary | ICD-10-CM

## 2020-03-09 NOTE — Patient Instructions (Signed)
History of COVID-19 Irregular Heart rate Heart palpitations:  Glad you are better!  Please keep follow up with cardiology  EKG showed NSR in office today - may need Zio monitor  Cardiology work-up revealed abnormal thyroid level - please have this addressed by PCP at upcoming physical   Insomnia Snoring Memory loss Headaches:   Keep follow up with neurology    Fatigue:  Stay active  Start multivitamin  Stay well hydrated  Will order labs and call with results   Follow up:  As needed

## 2020-03-09 NOTE — Progress Notes (Signed)
@Patient  ID: , female    DOB: Jan 13, 1971, 49 y.o.   MRN: [id number]  Chief Complaint  Patient presents with  . Follow-up    2 month follow up; feels brain fog is improving.    Referring provider: [id number], NP   49 year old female with history of diabetes, hyperlipidemia, and anxiety. Diagnosed with Covid in December 2020.   HPI  Patient presents today for post Covid care clinic visit. Spanish interpreter used for visit. Patient was diagnosed with Covid in December 2020.  Since that time she has been having ongoing issues with insomnia, leg pain, fatigue, brain fog, heart palpitations.  Patient states that she does snore heavily at nighttime.  Patient states that since her last visit she has been doing well.  She states that she has significantly improved.  She did follow-up with neurology and had an ONO which did reveal a drop in O2 sats overnight.  She is awaiting a return call from Dr. January 2021 to see if she needs a home sleep study performed.  She also followed up with cardiology and did have an echo and Zio patch ordered.  Cardiology did perform labs and thyroid level was abnormal and she was advised to follow-up with her PCP on this. Denies f/c/s, n/v/d, hemoptysis, PND, chest pain or edema.       No Known Allergies  Immunization History  Administered Date(s) Administered  . Influenza Split 04/18/2015  . Influenza,inj,Quad PF,6+ Mos 07/02/2019  . Tdap 03/15/2014    Past Medical History:  Diagnosis Date  . Anxiety   . Diabetes mellitus without complication (HCC)   . Hyperlipemia     Tobacco History: Social History   Tobacco Use  Smoking Status Never Smoker  Smokeless Tobacco Never Used   Counseling given: Not Answered   Outpatient Encounter Medications as of 03/09/2020  Medication Sig  . acetaminophen (TYLENOL) 500 MG tablet Take 500 mg by mouth every 6 (six) hours as needed for moderate pain.  05/09/2020 atorvastatin (LIPITOR) 20 MG tablet Take 1  tablet (20 mg total) by mouth daily.  . metFORMIN (GLUCOPHAGE) 500 MG tablet Take 1 tablet (500 mg total) by mouth 2 (two) times daily.   No facility-administered encounter medications on file as of 03/09/2020.     Review of Systems  Review of Systems  Constitutional: Negative.  Negative for fatigue and fever.  HENT: Negative.   Respiratory: Negative for cough and shortness of breath.   Cardiovascular: Negative.  Negative for chest pain, palpitations and leg swelling.  Gastrointestinal: Negative.   Allergic/Immunologic: Negative.   Neurological: Negative.   Psychiatric/Behavioral: Negative.        Physical Exam  BP 128/88   Pulse 75   Temp 97.7 F (36.5 C)   Ht 5\' 2"  (1.575 m)   Wt 168 lb (76.2 kg)   SpO2 98%   BMI 30.73 kg/m   Wt Readings from Last 5 Encounters:  03/09/20 168 lb (76.2 kg)  02/09/20 165 lb (74.8 kg)  01/28/20 165 lb 6.4 oz (75 kg)  01/25/20 165 lb (74.8 kg)  01/03/20 168 lb (76.2 kg)     Physical Exam Vitals and nursing note reviewed.  Constitutional:      General: She is not in acute distress.    Appearance: She is well-developed.  Cardiovascular:     Rate and Rhythm: Normal rate and regular rhythm.  Pulmonary:     Effort: Pulmonary effort is normal.     Breath sounds:  Normal breath sounds.  Musculoskeletal:     Right lower leg: No edema.     Left lower leg: No edema.  Neurological:     Mental Status: She is alert and oriented to person, place, and time.  Psychiatric:        Mood and Affect: Mood normal.        Behavior: Behavior normal.      Lab Results:  CBC    Component Value Date/Time   WBC 6.6 02/15/2020 0936   WBC 8.6 07/04/2019 0012   RBC 5.24 02/15/2020 0936   RBC 4.46 07/04/2019 0012   HGB 14.3 02/15/2020 0936   HCT 43.6 02/15/2020 0936   PLT 210 02/15/2020 0936   MCV 83 02/15/2020 0936   MCH 27.3 02/15/2020 0936   MCH 26.7 07/04/2019 0012   MCHC 32.8 02/15/2020 0936   MCHC 31.8 07/04/2019 0012   RDW 13.2  02/15/2020 0936   LYMPHSABS 1.0 07/04/2019 0012   MONOABS 0.5 07/04/2019 0012   EOSABS 0.0 07/04/2019 0012   BASOSABS 0.0 07/04/2019 0012    BMET    Component Value Date/Time   NA 141 02/15/2020 0936   K 4.1 02/15/2020 0936   CL 104 02/15/2020 0936   CO2 21 02/15/2020 0936   GLUCOSE 166 (H) 02/15/2020 0936   GLUCOSE 255 (H) 07/04/2019 0012   BUN 12 02/15/2020 0936   CREATININE 0.74 02/15/2020 0936   CREATININE 0.64 07/06/2015 1912   CALCIUM 9.5 02/15/2020 0936   GFRNONAA 96 02/15/2020 0936   GFRAA 111 02/15/2020 0936    BNP    Component Value Date/Time   BNP 7.3 01/28/2020 0849   BNP 99.5 07/04/2019 0012    ProBNP No results found for: PROBNP  Imaging: ECHOCARDIOGRAM COMPLETE  Result Date: 02/22/2020    ECHOCARDIOGRAM REPORT   Patient Name:   SHAKEERAH GRADEL Date of Exam: 02/22/2020 Medical Rec #:  [id number]      Height:       62.0 in Accession #:    [id number]     Weight:       165.0 lb Date of Birth:  [birth year]      BSA:          1.762 m Patient Age:    49 years       BP:           137/82 mmHg Patient Gender: F              HR:           67 bpm. Exam Location:  [address] Procedure: 2D Echo, Cardiac Doppler and Color Doppler Indications:    R002. Palpitations  History:        Patient has no prior history of Echocardiogram examinations.                 Signs/Symptoms:Shortness of Breath; Risk Factors:Diabetes and                 HLD. COVID 19 (Dec 2020).  Sonographer:    Clearence Ped RCS Referring Phys: [id number] Ronnald Ramp O'NEAL IMPRESSIONS  1. Left ventricular ejection fraction, by estimation, is 55 to 60%. The left ventricle has normal function. The left ventricle has no regional wall motion abnormalities. Left ventricular diastolic parameters were normal.  2. Right ventricular systolic function is normal. The right ventricular size is normal.  3. The mitral valve is normal in structure. Trivial mitral valve regurgitation. No evidence of mitral stenosis.  4. The  aortic  valve is tricuspid. Aortic valve regurgitation is not visualized. No aortic stenosis is present.  5. The inferior vena cava is normal in size with greater than 50% respiratory variability, suggesting right atrial pressure of 3 mmHg. FINDINGS  Left Ventricle: Left ventricular ejection fraction, by estimation, is 55 to 60%. The left ventricle has normal function. The left ventricle has no regional wall motion abnormalities. The left ventricular internal cavity size was normal in size. There is  no left ventricular hypertrophy. Left ventricular diastolic parameters were normal. Right Ventricle: The right ventricular size is normal.Right ventricular systolic function is normal. Left Atrium: Left atrial size was normal in size. Right Atrium: Right atrial size was normal in size. Pericardium: There is no evidence of pericardial effusion. Mitral Valve: The mitral valve is normal in structure. Normal mobility of the mitral valve leaflets. Trivial mitral valve regurgitation. No evidence of mitral valve stenosis. Tricuspid Valve: The tricuspid valve is normal in structure. Tricuspid valve regurgitation is trivial. No evidence of tricuspid stenosis. Aortic Valve: The aortic valve is tricuspid. Aortic valve regurgitation is not visualized. No aortic stenosis is present. Pulmonic Valve: The pulmonic valve was normal in structure. Pulmonic valve regurgitation is not visualized. No evidence of pulmonic stenosis. Aorta: The aortic root is normal in size and structure. Venous: The inferior vena cava is normal in size with greater than 50% respiratory variability, suggesting right atrial pressure of 3 mmHg. IAS/Shunts: No atrial level shunt detected by color flow Doppler.  LEFT VENTRICLE PLAX 2D LVIDd:         3.60 cm  Diastology LVIDs:         2.40 cm  LV e' lateral:   9.68 cm/s LV PW:         1.00 cm  LV E/e' lateral: 8.7 LV IVS:        1.00 cm  LV e' medial:    13.60 cm/s LVOT diam:     1.90 cm  LV E/e' medial:  6.2 LV SV:          62 LV SV Index:   35 LVOT Area:     2.84 cm  RIGHT VENTRICLE RV Basal diam:  2.70 cm RV S prime:     6.45 cm/s TAPSE (M-mode): 1.9 cm LEFT ATRIUM             Index       RIGHT ATRIUM          Index LA diam:        3.10 cm 1.76 cm/m  RA Area:     9.02 cm LA Vol (A2C):   28.4 ml 16.12 ml/m RA Volume:   19.00 ml 10.79 ml/m LA Vol (A4C):   22.7 ml 12.89 ml/m LA Biplane Vol: 24.8 ml 14.08 ml/m  AORTIC VALVE LVOT Vmax:   88.10 cm/s LVOT Vmean:  61.000 cm/s LVOT VTI:    0.218 m  AORTA Ao Root diam: 2.70 cm MITRAL VALVE MV Area (PHT):             SHUNTS MV Decel Time:             Systemic VTI:  0.22 m MV E velocity: 84.40 cm/s  Systemic Diam: 1.90 cm MV A velocity: 51.80 cm/s MV E/A ratio:  1.63 Olga Millers MD Electronically signed by Olga Millers MD Signature Date/Time: 02/22/2020/1:08:12 PM    Final    LONG TERM MONITOR (3-14 DAYS)  Result Date: 03/06/2020 Enrollment 02/05/2020-02/12/2020 (6 days 18 hours).  Patient had a min HR of 52 bpm (sinus bradycardia), max HR of 169 bpm (supraventricular tachycardia, 4.6 second duration), and avg HR of 92 bpm (normals sinus rhythm). Predominant underlying rhythm was Sinus Rhythm. 1 run of Supraventricular Tachycardia occurred lasting 11 beats (4.6 seconds) with a max rate of 169 bpm (avg 144 bpm). Episode of Supraventricular Tachycardia was brief Atrial Tachycardia. Isolated SVEs were rare (<1.0%), SVE Couplets were rare (<1.0%), and no SVE Triplets were present. Isolated VEs were rare (<1.0%), and no VE Couplets or VE Triplets were present. A diary was submitted but the timing of the symptoms was not recorded. Impression: 1. No significant arrhythmia detected. 2. Rare ectopy. Gerri SporeWesley T. Flora Lipps'Neal, MD [facility]  [facility] [address], [address] [place], [place] [address] [phone] 6:36 AM    Assessment & Plan:   History of COVID-19 Irregular Heart rate Heart palpitations:  Glad you are better!  Please keep follow up with cardiology  EKG showed  NSR in office today - may need Zio monitor  Cardiology work-up revealed abnormal thyroid level - please have this addressed by PCP at upcoming physical   Insomnia Snoring Memory loss Headaches:   Keep follow up with neurology    Fatigue:  Stay active  Start multivitamin  Stay well hydrated  Will order labs and call with results   Follow up:  As needed      Ivonne Andrewonya S Leiland Mihelich, NP 03/09/2020

## 2020-03-09 NOTE — Assessment & Plan Note (Signed)
Irregular Heart rate Heart palpitations:  Glad you are better!  Please keep follow up with cardiology  EKG showed NSR in office today - may need Zio monitor  Cardiology work-up revealed abnormal thyroid level - please have this addressed by PCP at upcoming physical   Insomnia Snoring Memory loss Headaches:   Keep follow up with neurology    Fatigue:  Stay active  Start multivitamin  Stay well hydrated  Will order labs and call with results   Follow up:  As needed

## 2020-03-14 ENCOUNTER — Ambulatory Visit: Payer: Self-pay | Admitting: *Deleted

## 2020-03-14 ENCOUNTER — Ambulatory Visit
Admission: RE | Admit: 2020-03-14 | Discharge: 2020-03-14 | Disposition: A | Discharge status: Home / Self Care | Payer: No Typology Code available for payment source | Source: Ambulatory Visit | Attending: Obstetrics and Gynecology | Admitting: Obstetrics and Gynecology

## 2020-03-14 ENCOUNTER — Other Ambulatory Visit: Payer: Self-pay

## 2020-03-14 VITALS — BP 116/82 | Temp 97.8°F | Wt 167.7 lb

## 2020-03-14 DIAGNOSIS — Z1231 Encounter for screening mammogram for malignant neoplasm of breast: Secondary | ICD-10-CM

## 2020-03-14 DIAGNOSIS — Z01419 Encounter for gynecological examination (general) (routine) without abnormal findings: Secondary | ICD-10-CM

## 2020-03-14 NOTE — Progress Notes (Signed)
[name] is a 49 y.o. [phone] female who presents to [facility] clinic today with no complaints.    Pap Smear: Pap smear completed today. Last Pap smear was 07/06/2015 at Primary care at [facility] and was normal. Per patient has no history of an abnormal Pap smear. Last Pap smear result is available in Epic.   Physical exam: Breasts Breasts symmetrical. No skin abnormalities bilateral breasts. No nipple retraction bilateral breasts. No nipple discharge bilateral breasts. No lymphadenopathy. No lumps palpated bilateral breasts. No complaints of pain or tenderness on exam.      Pelvic/Bimanual Ext Genitalia No lesions, no swelling and no discharge observed on external genitalia.        Vagina Vagina pink and normal texture. No lesions or discharge observed in vagina.        Cervix Cervix is present. Cervix pink and of normal texture. No discharge observed.    Uterus Uterus is present and palpable. Uterus in normal position and normal size.        Adnexae Bilateral ovaries present and palpable. No tenderness on palpation.         Rectovaginal No rectal exam completed today since patient had no rectal complaints. No skin abnormalities observed on exam.     Smoking History: Patient is a non-smoker.   Patient Navigation: Patient education provided. Access to services provided for patient through BCCCP program. Laveda Norman, Spanish interpreter provided through [facility].     Breast and Cervical Cancer Risk Assessment: Patient does not have family history of breast cancer, known genetic mutations, or radiation treatment to the chest before age 45. Patient does not have history of cervical dysplasia, immunocompromised, or DES exposure in-utero.  Risk Assessment    Risk Scores      03/14/2020   Last edited by: Meryl Dare, CMA   5-year risk: 0.7 %   Lifetime risk: 6.7 %          A: BCCCP exam with pap smear No complaints today.  P: Referred patient to the [facility] Center  [facility] for a screening mammogram on the mobile unit. Appointment scheduled March 14, 2020 at 11:00am.  Mila Homer, RN, FNP student 03/14/2020 11:01 AM   Attestation of Supervision of Student:  I confirm that I have verified the information documented in the nurse practitioner student's note and that I have also personally reperformed the history, physical exam and all medical decision making activities.  I have verified that all services and findings are accurately documented in this student's note; and I agree with management and plan as outlined in the documentation. I have also made any necessary editorial changes.  Brannock, Kathaleen Maser, RN [facility] [employer], [facility] 03/14/2020 12:35 PM

## 2020-03-14 NOTE — Patient Instructions (Addendum)
Informed Cheryl Patrick about breast self awareness. Patient received a Pap smear today. Let patient know BCCCP will cover Pap smears and HPV typing every 5 years unless has a history of abnormal Pap smears. Referred patient to the [facility] Center of [facility] for screening mammogram on the mobile unit. Appointment scheduled for March 14, 2020 at 11:00am. Patient escorted to mobile unit following BCCCP appointment. Let patient know will follow up with her within the next couple weeks with results of her Pap smear by letter or phone. Informed patient that the Breast Center will follow-up with her within the next couple of weeks with results of her mammogram by letter or phone. Cheryl Patrick verbalized understanding.  Mila Homer, RN, FNP student 11:08 AM

## 2020-03-15 ENCOUNTER — Ambulatory Visit: Payer: Self-pay | Admitting: Nurse Practitioner

## 2020-03-15 LAB — CYTOLOGY - PAP
Comment: NEGATIVE
Diagnosis: NEGATIVE
High risk HPV: NEGATIVE

## 2020-03-20 ENCOUNTER — Inpatient Hospital Stay: Payer: Self-pay | Attending: Obstetrics and Gynecology | Admitting: *Deleted

## 2020-03-20 ENCOUNTER — Other Ambulatory Visit: Payer: Self-pay

## 2020-03-20 ENCOUNTER — Other Ambulatory Visit: Payer: Self-pay | Admitting: Obstetrics and Gynecology

## 2020-03-20 VITALS — BP 102/72 | Ht 62.5 in | Wt 168.3 lb

## 2020-03-20 DIAGNOSIS — Z0000 Encounter for general adult medical examination without abnormal findings: Secondary | ICD-10-CM

## 2020-03-20 NOTE — Progress Notes (Signed)
[facility] initial screening   Interpreter- Natale Lay, [place]   Clinical Measurement:  Vitals:   03/20/20 0827 03/20/20 0839  BP: 104/70 102/72   Fasting Labs Drawn Today, will review with patient when they result.   Medical History:  Patient states that she has high cholesterol, does not have high blood pressure and she has diabetes.  Medications:  Patient states that she does not take medication to lower cholesterol, blood pressure and blood sugar.  Patient does not take an aspirin a day to help prevent a heart attack or stroke.    Blood pressure, self measurement: Patient states that she does not measure blood pressure from home. She checks her blood pressure N/A. She shares her readings with a health care provider: N/A.   Nutrition: Patient states that on average she eats 3 cups of fruit and 1 cups of vegetables per day. Patient states that she does eat fish at least 2 times per week. Patient eats less than half servings of whole grains. Patient drinks less than 36 ounces of beverages with added sugar weekly: yes. Patient is currently watching sodium or salt intake: yes. In the past 7 days patient has had 0 drinks containing alcohol. On average patient drinks 0 drinks containing alcohol per day.      Physical activity:  Patient states that she gets 0 minutes of moderate and 0 minutes of vigorous physical activity each week.  Smoking status:  Patient states that she has has never smoked .   Quality of life:  Over the past 2 weeks patient states that she had little interest or pleasure in doing things: nearly everyday. She has been feeling down, depressed or hopeless:nearly everyday.    Risk reduction and counseling:     Patient has been without cholesterol and diabetes medication for some time. Explained to patient that her labs would probably come back elevated because of this. Explained that if her labs do come back abnormal I will get a follow-up appointment scheduled with her  PCP.   Encouraged her to continue to watch the amount of fried and fatty foods consumed as well as her sweets and sugars and carbs. Encouraged patient to start trying to exercise for 20 minutes daily. Also spoke with patient about adding more vegetables and whole grains in daily diet.    Navigation:  I will notify patient of lab results.  Patient is aware of 2 more health coaching sessions and a follow up. Offered to give patient resource list for local counselors. Patient stated that she has spoken to someone. Declined referral list.   Time: 20 minutes

## 2020-03-21 LAB — LIPID PANEL
Chol/HDL Ratio: 4.3 ratio (ref 0.0–4.4)
Cholesterol, Total: 164 mg/dL (ref 100–199)
HDL: 38 mg/dL — ABNORMAL LOW (ref >39)
LDL Chol Calc (NIH): 94 mg/dL (ref 0–99)
Triglycerides: 188 mg/dL — ABNORMAL HIGH (ref 0–149)
VLDL Cholesterol Cal: 32 mg/dL (ref 5–40)

## 2020-03-21 LAB — GLUCOSE, RANDOM: Glucose: 167 mg/dL — ABNORMAL HIGH (ref 65–99)

## 2020-03-21 LAB — HEMOGLOBIN A1C
Est. average glucose Bld gHb Est-mCnc: 154 mg/dL
Hgb A1c MFr Bld: 7 % — ABNORMAL HIGH (ref 4.8–5.6)

## 2020-03-22 ENCOUNTER — Telehealth: Payer: Self-pay

## 2020-03-22 NOTE — Telephone Encounter (Signed)
Via Natale Lay, Spanish Interpreter, Patient informed negative Pap/HPV, next pap due in 5 years. Patient verbalized understanding, pleased with results.

## 2020-03-28 ENCOUNTER — Telehealth: Payer: Self-pay

## 2020-03-28 NOTE — Telephone Encounter (Signed)
Health coaching 2   interpreter-[employer]   Labs- 167 cholesterol, 94 LDL cholesterol, 188 triglycerides, 38 HDL cholesterol, 7.0 hemoglobin A1C, 120 mean plasma glucose  Patient understands and is aware of her lab results.   Goals- Discussed lab results with patient. Answered any questions that patient had regarding results.  Goals: 1. Increase the amount of healthy fats consumed to raise HDL ( fish high in omega 3's, olive oil, avocados, nuts). 2. Reduce the amount of fried and fatty foods consumed. Try to grill, bake, broil or sautee foods instead. 3. Reduce the amount of sweets and sugars and carbs consumed. 4. Increase daily exercise with a goal of 20 minutes per day.    Navigation:  Patient is aware of 1 more health coaching sessions and a follow up. Patient is scheduled for follow-up for elevated labs with [facility] on April 20, 2020 @10 :00 am.  Time- 15 minutes

## 2020-04-20 ENCOUNTER — Other Ambulatory Visit: Payer: Self-pay | Admitting: Family

## 2020-04-20 ENCOUNTER — Ambulatory Visit: Payer: Self-pay | Attending: Family | Admitting: Family

## 2020-04-20 DIAGNOSIS — Z789 Other specified health status: Secondary | ICD-10-CM

## 2020-04-20 DIAGNOSIS — E1165 Type 2 diabetes mellitus with hyperglycemia: Secondary | ICD-10-CM

## 2020-04-20 DIAGNOSIS — Z603 Acculturation difficulty: Secondary | ICD-10-CM

## 2020-04-20 MED ORDER — METFORMIN HCL 500 MG PO TABS: 500.0000 mg | ORAL_TABLET | Freq: Two times a day (BID) | ORAL | 2 refills | Status: DC

## 2020-04-20 NOTE — Progress Notes (Signed)
Virtual Visit via Telephone Note  I connected with Cheryl Patrick, on 04/20/2020 at 10:19 AM by telephone due to the COVID-19 pandemic and verified that I am speaking with the correct person using two identifiers.  Due to current restrictions/limitations of in-office visits due to the COVID-19 pandemic, this scheduled clinical appointment was converted to a telehealth visit.   Consent: I discussed the limitations, risks, security and privacy concerns of performing an evaluation and management service by telephone and the availability of in person appointments. I also discussed with the patient that there may be a patient responsible charge related to this service. The patient expressed understanding and agreed to proceed.  Location of Patient: Home  Location of Provider: [facility]  Persons participating in Telemedicine visit: Alaiah Lundy Ricky Stabs, NP Laruth Bouchard, [place] Interpreter Name: Jake Shark, ID#: [id number]  History of Present Illness: Cheryl Patrick is a 49 year old female with history of community acquired pneumonia, dyspepsia, chest pain, abdominal pain, Covid-19 virus infection, insomnia, frequent headaches, palpitations, and fatigue who presents for diabetes follow-up.  1. DIABETES TYPE 2 FOLLOW-UP: 02/09/2020:  Visit with Dr. Laural Benes. During that encounter Metformin continued. Referred to Ophthalmology. Microalbumin/creatinine urine ratio, hemoglobin A1C, and CMP obtained.  04/20/2020: Last A1C: 7.0% on 03/20/2020 Med Adherence:  [x]  Yes    []  No Medication side effects:  []  Yes    [x]  No Home Monitoring?  [x]  Yes    []  No Home glucose results range: 145-170 Diet Adherence: [x]  Yes    []  No Exercise: [x]  Yes    []  No Hypoglycemic episodes?: []  Yes    [x]  No Last eye exam: denies Today requesting refills on medication.  Past Medical History:  Diagnosis Date  . Anxiety   . Diabetes mellitus without complication (HCC)   . Hyperlipemia    No  Known Allergies  Current Outpatient Medications on File Prior to Visit  Medication Sig Dispense Refill  . acetaminophen (TYLENOL) 500 MG tablet Take 500 mg by mouth every 6 (six) hours as needed for moderate pain. (Patient not taking: Reported on 03/20/2020)    . atorvastatin (LIPITOR) 20 MG tablet Take 1 tablet (20 mg total) by mouth daily. (Patient not taking: Reported on 03/20/2020) 90 tablet 3  . metFORMIN (GLUCOPHAGE) 500 MG tablet Take 1 tablet (500 mg total) by mouth 2 (two) times daily. 60 tablet 0   No current facility-administered medications on file prior to visit.    Observations/Objective: Alert and oriented x 3. Not in acute distress. Physical examination not completed as this is a telemedicine visit.  Assessment and Plan: 1. Type 2 diabetes mellitus with hyperglycemia, without long-term current use of insulin (HCC): - Continue Metformin as prescribed.  - Last hemoglobin A1C obtained 03/20/2020. - To achieve an A1C goal of less than or equal to 7.0 percent, a fasting blood sugar of 80 to 130 mg/dL and a postprandial glucose (90 to 120 minutes after a meal) less than 180 mg/dL. In the event of sugars less than 60 mg/dl or greater than mg/dl please notify the clinic ASAP. It is recommended that you undergo annual eye exams and annual foot exams. - Discussed the importance of healthy eating habits, low-carbohydrate diet, low-sugar diet, regular aerobic exercise (at least 150 minutes a week as tolerated) and medication compliance to achieve or maintain control of diabetes. - Follow-up with primary provider in 2 months or sooner if needed. - metFORMIN (GLUCOPHAGE) 500 MG tablet; Take 1 tablet (500 mg total)  by mouth 2 (two) times daily.  Dispense: 60 tablet; Refill: 2  2. Language barrier: Merchant navy officer participated during today's visit.  - Interpreter Name: Jake Shark, ID#: [id number]  Follow Up Instructions: Follow-up with primary provider in 2 months or sooner if needed.    Patient was given clear instructions to go to Emergency Department or return to medical center if symptoms don't improve, worsen, or new problems develop.The patient verbalized understanding.  I discussed the assessment and treatment plan with the patient. The patient was provided an opportunity to ask questions and all were answered. The patient agreed with the plan and demonstrated an understanding of the instructions.   The patient was advised to call back or seek an in-person evaluation if the symptoms worsen or if the condition fails to improve as anticipated.   I provided 15 minutes total of non-face-to-face time during this encounter including median intraservice time, reviewing previous notes, labs, imaging, medications, management and patient verbalized understanding.    Rema Fendt, NP  [facility] [place], [place] [phone]   04/20/2020, 7:50 AM

## 2020-04-20 NOTE — Patient Instructions (Signed)
Continue Metformin as prescribed. Informacin bsica sobre la diabetes Diabetes Basics  La diabetes (diabetes mellitus) es una enfermedad de larga duracin (crnica). Se produce cuando el cuerpo no utiliza Artist (glucosa) que se libera de los alimentos despus de comer. La diabetes puede deberse a uno de [employer] o a ambos:  El pncreas no produce suficiente cantidad de una hormona llamada insulina.  El cuerpo no reacciona de forma normal a la insulina que produce. La insulina permite que ciertos azcares (glucosa) ingresen a las clulas del cuerpo. Esto le proporciona energa. Si tiene diabetes, los azcares no pueden ingresar a las clulas. Esto produce un aumento del nivel de Banker (hiperglucemia). Sigue estas instrucciones en tu casa: Cmo se trata la diabetes? Es posible que tenga que administrarse insulina u otros medicamentos para la diabetes todos los [place] para mantener el nivel de International aid/development worker en la sangre equilibrado. Adminstrese los medicamentos para la diabetes todos los [place] se lo haya indicado el mdico. Haga una lista de los medicamentos para la diabetes aqu: Medicamentos para la diabetes  Nombre del medicamento: ______________________________ ? Cantidad (dosis): ________________ Mammie Russian (a.m./p.m.): _______________ Cleon Dew: ___________________________________  Josephine Igo medicamento: ______________________________ ? Cantidad (dosis): ________________ Mammie Russian (a.m./p.m.): _______________ Cleon Dew: ___________________________________  Josephine Igo medicamento: ______________________________ ? Cantidad (dosis): ________________ Mammie Russian (a.m./p.m.): _______________ Cleon Dew: ___________________________________ Si [place] insulina, aprender cmo aplicrsela con inyecciones. Es posible que deba ajustar la cantidad en funcin de los alimentos que coma. Haga una lista de los tipos de [place] aqu: Insulina  Tipo de insulina:  ______________________________ ? Cantidad (dosis): ________________ Mammie Russian (a.m./p.m.): _______________ Cleon Dew: ___________________________________  Levander Campion: ______________________________ ? Cantidad (dosis): ________________ Mammie Russian (a.m./p.m.): _______________ Cleon Dew: ___________________________________  Levander Campion: ______________________________ ? Cantidad (dosis): ________________ Mammie Russian (a.m./p.m.): _______________ Cleon Dew: ___________________________________  Levander Campion: ______________________________ ? Cantidad (dosis): ________________ Mammie Russian (a.m./p.m.): _______________ Cleon Dew: ___________________________________  Levander Campion: ______________________________ ? Cantidad (dosis): ________________ Mammie Russian (a.m./p.m.): _______________ Cleon Dew: ___________________________________ Cmo me controlo el nivel de azcar en la sangre?  Controle sus niveles de azcar en la sangre con un medidor de glucemia segn las indicaciones del mdico. El mdico fijar los objetivos del tratamiento para usted. Generalmente, los resultados de los niveles de azcar en la sangre deben ser los siguientes:  Antes de las comidas (preprandial): de 80 a 130mg /dl (de 4,4 a ).  Despus de las comidas (posprandial): por debajo de 180mg /dl (6,2GBTD/V).  Nivel de A1c: menos del 7%. Anote las veces que se controlar los niveles de azcar en la sangre: Controles de azcar en la sangre  Hora: _______________ : ___________________________________  76HYWV/P: _______________ Notas: ___________________________________  Hora: _______________ Notas: ___________________________________  Hora: _______________ Notas: ___________________________________  Hora: _______________ Notas: ___________________________________  Hora: _______________ Notas: ___________________________________  Cleon Dew debo saber acerca del nivel bajo de azcar en la sangre? Un nivel bajo de azcar en la sangre se  denomina hipoglucemia. Este cuadro ocurre cuando el nivel de Mammie Russian en la sangre es igual o menor que 70mg /dl (Ladell Heads). Entre los sntomas, se pueden incluir los siguientes:  Sentir: ? [place]. ? Preocupacin o nervios (ansiedad). ? Sudoracin y . ? Confusin. ? Mareos. ? Somnolencia. ? Ganas de vomitar (nuseas).  Tener: ? Latidos cardacos acelerados. ? Dolor de 7,1GGYI/R. ? Cambios en la visin. ? Hormigueo y falta de sensibilidad (entumecimiento) alrededor de la boca, los labios o la [place]. ? Movimientos espasmdicos que no puede controlar (convulsiones).  Dificultades para hacer lo siguiente: ? Moverse (coordinacin). ? Dormir. ? Desmayos. ? Molestarse con facilidad (  irritabilidad). Tratamiento del nivel bajo de azcar en la sangre Para tratar un nivel bajo de azcar en la sangre, ingiera un alimento o una bebida azucarada de inmediato. Si puede pensar con claridad y tragar de manera segura, siga la regla 15/15, que consiste en lo siguiente:  Consuma 15gramos de un hidrato de carbono de accin rpida (carbohidrato). Hable con su mdico acerca de cunto debera consumir.  Algunos hidratos de carbono de accin rpida son: ? Comprimidos de azcar (pastillas de glucosa). Consuma 3o 4pastillas de glucosa. ? De 6 a 8unidades de caramelos duros. ? De 4 a 6onzas (de 120 a ) de jugo de frutas. ? De 4 a 6onzas (de 120 a ) de refresco comn (no diettico). ? 1 cucharada (78ml) de miel o azcar.  Contrlese el nivel de azcar en la sangre despus de ingerir el hidrato de carbono.  Si el nivel de azcar en la sangre todava es igual o menor que 70mg /dl ( ), ingiera nuevamente 15gramos de un hidrato de carbono.  Si el nivel de azcar en la sangre no supera los 70mg /dl (0,2IOXB/D) despus de 3intentos, solicite ayuda de inmediato.  Ingiera una comida o una colacin en el transcurso de 1hora despus de que el nivel de azcar en  la sangre se haya normalizado. Tratamiento del nivel muy bajo de azcar en la sangre Si el nivel de azcar en la sangre es igual o menor que 54mg /dl (35mmol/l), significa que est muy bajo (hipoglucemia grave). Esto es 5,3GDJM/E. No espere a ver si los sntomas desaparecen. Solicite atencin mdica de inmediato. Comunquese con el servicio de emergencias de su localidad (911 en los [place]). No conduzca por sus propios medios hospital. Preguntas para hacerle al mdico  Es necesario que me rena con 1m en el cuidado de la diabetes?  Qu equipos necesitar para cuidarme en casa?  Qu medicamentos para la diabetes necesito? Cundo debo tomarlos?  Con qu frecuencia debo controlar mi nivel de azcar en la sangre?  A qu nmero puedo llamar si tengo preguntas?  Cundo es mi prxima cita con el mdico?  Dnde puedo encontrar un grupo de apoyo para las personas con diabetes? Dnde buscar ms informacin  American Diabetes Association (Asociacin Estadounidense de la Diabetes): www.diabetes.org  American Association of Diabetes Educators (Asociacin Estadounidense de Instructores para el Cuidado de la Diabetes): www.diabeteseducator.org/patient-resources Comunquese con un mdico si:  El nivel de azcar en la sangre es igual o mayor que 240mg /dl (Radio broadcast assistant) durante 2das seguidos.  Ha estado enfermo o ha tenido fiebre durante 2das o ms y no mejora.  Tiene alguno de estos problemas durante ms de 6horas: ? No puede comer ni beber. ? Siente malestar estomacal (nuseas). ? Vomita. ? Presenta heces lquidas (diarrea). Solicite ayuda inmediatamente si:  El nivel de azcar en la sangre est por debajo de 54mg /dl (34mmol/l).  Se siente confundida.  Tiene dificultad para hacer lo siguiente: ? Pensar con claridad. ? La respiracin. Resumen  La diabetes (diabetes mellitus) es una enfermedad de larga duracin (crnica). Se produce cuando el  cuerpo no utiliza IT trainer (glucosa) que se libera de los alimentos despus de la digestin.  Aplquese la insulina y tome los medicamentos para la diabetes como se lo hayan indicado.  Contrlese el nivel de azcar en la sangre todos los [place], con la frecuencia que le hayan indicado.  Concurra a todas las visitas de 26,8TMHD/QQ se lo haya indicado el mdico. Esto es importante. Esta informacin no  tiene Theme park manager el consejo del mdico. Asegrese de hacerle al mdico cualquier pregunta que tenga. Document Revised: 09/09/2018 Document Reviewed: 11/21/2017 Elsevier Patient Education  2020 [employer].

## 2020-04-30 NOTE — Progress Notes (Signed)
Cardiology Office Note:   Date:  05/01/2020  NAME:  Cheryl Patrick    MRN: [id number] DOB:  1970-10-09   PCP:  Claiborne Rigg, NP  Cardiologist:  No primary care provider on file.   Referring MD: No ref. provider found   Chief Complaint  Patient presents with  . Follow-up   History of Present Illness:   Cheryl Patrick is a 49 y.o. female with a hx of DM, HLD, covid 19 infection who presents for follow-up of SOB and palpitations. Normal echo and normal monitor. BNP 7.3.  She reports she is doing well.  She has had less episodes of palpitations since her last visit.  She reports 2 times per week she will get a sensation of rapid heartbeat.  It lasted around 3 minutes and goes away.  Improved with laying down.  She denies any chest pain or shortness of breath.  She is still exercising.  Walking up to 30 minutes/day.  She has no symptoms when she does this.  She does request to have her thyroid studies addressed.  Apparently TSH was borderline normal.  Do need to have this rechecked today which we can do.  We did discuss that her echo and monitor were normal.  This is reassuring.  She has no arrhythmias on her monitor.  Suspect this is anxiety related.  She reports she overall is in good health and things are doing better.  Her most recent A1c is 7.  LDL cholesterol is not at goal.  We did discuss this today in office.  We will increase her Lipitor.  She denies any chest pain or shortness of breath today in office.  Problem List 1. DM -a1c 7.0 2. HLD -T chol 164, HDL 38, LDL 94, TG 188 3. Anxiety  Past Medical History: Past Medical History:  Diagnosis Date  . Anxiety   . Diabetes mellitus without complication (HCC)   . Hyperlipemia     Past Surgical History: Past Surgical History:  Procedure Laterality Date  . DILATION AND EVACUATION N/A 05/10/2017   Procedure: DILATATION AND EVACUATION;  Surgeon: Tyrone Bing, MD;  Location: [facility];  Service: Gynecology;  Laterality: N/A;  .  NO PAST SURGERIES      Current Medications: Current Meds  Medication Sig  . acetaminophen (TYLENOL) 500 MG tablet Take 500 mg by mouth every 6 (six) hours as needed for moderate pain.   Marland Kitchen atorvastatin (LIPITOR) 40 MG tablet Take 1 tablet (40 mg total) by mouth daily.  . metFORMIN (GLUCOPHAGE) 500 MG tablet Take 1 tablet (500 mg total) by mouth 2 (two) times daily.  . [DISCONTINUED] atorvastatin (LIPITOR) 20 MG tablet Take 1 tablet (20 mg total) by mouth daily.     Allergies:    Patient has no known allergies.   Social History: Social History   Socioeconomic History  . Marital status: Married    Spouse name: Not on file  . Number of children: 3  . Years of education: Not on file  . Highest education level: 9th grade  Occupational History  . Occupation: International aid/development worker: [employer]  Tobacco Use  . Smoking status: Never Smoker  . Smokeless tobacco: Never Used  Vaping Use  . Vaping Use: Never used  Substance and Sexual Activity  . Alcohol use: No  . Drug use: No  . Sexual activity: Yes    Birth control/protection: Rhythm  Other Topics Concern  . Not on file  Social History Narrative  .  Not on file   Social Determinants of Health   Financial Resource Strain:   . Difficulty of Paying Living Expenses: Not on file  Food Insecurity:   . Worried About Programme researcher, broadcasting/film/video in the Last Year: Not on file  . Ran Out of Food in the Last Year: Not on file  Transportation Needs: No Transportation Needs  . Lack of Transportation (Medical): No  . Lack of Transportation (Non-Medical): No  Physical Activity:   . Days of Exercise per Week: Not on file  . Minutes of Exercise per Session: Not on file  Stress:   . Feeling of Stress : Not on file  Social Connections:   . Frequency of Communication with Friends and Family: Not on file  . Frequency of Social Gatherings with Friends and Family: Not on file  . Attends Religious Services: Not on file  . Active Member of Clubs  or Organizations: Not on file  . Attends Banker Meetings: Not on file  . Marital Status: Not on file     Family History: The patient's family history includes Diabetes in her father and mother; Hypertension in her father and mother; Lymphoma in her mother.  ROS:   All other ROS reviewed and negative. Pertinent positives noted in the HPI.     EKGs/Labs/Other Studies Reviewed:   The following studies were personally reviewed by me today:  TTE 02/22/2020 1. Left ventricular ejection fraction, by estimation, is 55 to 60%. The  left ventricle has normal function. The left ventricle has no regional  wall motion abnormalities. Left ventricular diastolic parameters were  normal.  2. Right ventricular systolic function is normal. The right ventricular  size is normal.  3. The mitral valve is normal in structure. Trivial mitral valve  regurgitation. No evidence of mitral stenosis.  4. The aortic valve is tricuspid. Aortic valve regurgitation is not  visualized. No aortic stenosis is present.  5. The inferior vena cava is normal in size with greater than 50%  respiratory variability, suggesting right atrial pressure of 3 mmHg.  [place] 02/05/2020 1. No significant arrhythmia detected.  2. Rare ectopy.    Recent Labs: 07/04/2019: Magnesium 2.2 01/28/2020: BNP 7.3; TSH 4.530 02/15/2020: ALT 20; BUN 12; Creatinine, Ser 0.74; Hemoglobin 14.3; Platelets 210; Potassium 4.1; Sodium 141   Recent Lipid Panel    Component Value Date/Time   CHOL 164 03/20/2020 0925   TRIG 188 (H) 03/20/2020 0925   HDL 38 (L) 03/20/2020 0925   CHOLHDL 4.3 03/20/2020 0925   CHOLHDL 4.1 11/09/2012 1247   VLDL 38 11/09/2012 1247   LDLCALC 94 03/20/2020 0925    Physical Exam:   VS:  BP 120/74   Pulse 79   Ht 5' 2.5" (1.588 m)   Wt 170 lb (77.1 kg)   SpO2 98%   BMI 30.60 kg/m    Wt Readings from Last 3 Encounters:  05/01/20 170 lb (77.1 kg)  03/20/20 168 lb 4.8 oz (76.3 kg)  03/14/20 167 lb  11.2 oz (76.1 kg)    General: Well nourished, well developed, in no acute distress Heart: Atraumatic, normal size  Eyes: PEERLA, EOMI  Neck: Supple, no JVD Endocrine: No thryomegaly Cardiac: Normal S1, S2; RRR; no murmurs, rubs, or gallops Lungs: Clear to auscultation bilaterally, no wheezing, rhonchi or rales  Abd: Soft, nontender, no hepatomegaly  Ext: No edema, pulses 2+ Musculoskeletal: No deformities, BUE and BLE strength normal and equal Skin: Warm and dry, no rashes   Neuro:  Alert and oriented to person, place, time, and situation, CNII-XII grossly intact, no focal deficits  Psych: Normal mood and affect   ASSESSMENT:   Woodward KuMaria Maske is a 49 y.o. female who presents for the following: 1. COVID-19 virus infection   2. SOB (shortness of breath)   3. Palpitations   4. Mixed hyperlipidemia   5. Abnormal thyroid blood test   6. Dyslipidemia, goal LDL below 70     PLAN:   1. COVID-19 virus infection 2. SOB (shortness of breath) 3. Palpitations -Normal echo.  BNP 7.  Normal EKG.  Normal cardiovascular exam.  Normal monitor.  No cardiac etiology of her symptoms was discovered.  I suspect her symptoms are stress related.  They seem to be improving.  No further testing. -Her most recent TSH was 4.53.  This is on the fence of being normal.  We will recheck her thyroid studies today.  Including a free T4 and T3.  4. Mixed hyperlipidemia -She is diabetic.  LDL cholesterol 94.  We will increase her Lipitor to 40 mg daily.  She can have her primary care physician recheck these.  5. Abnormal thyroid blood test -Most recent TSH 4.53.  We will recheck labs today.  TSH, free T4 and T3.  Disposition: Return if symptoms worsen or fail to improve.  Medication Adjustments/Labs and Tests Ordered: Current medicines are reviewed at length with the patient today.  Concerns regarding medicines are outlined above.  Orders Placed This Encounter  Procedures  . TSH  . T3, free  . T4, free    Meds ordered this encounter  Medications  . atorvastatin (LIPITOR) 40 MG tablet    Sig: Take 1 tablet (40 mg total) by mouth daily.    Dispense:  90 tablet    Refill:  1    Patient Instructions  Medication Instructions:  Increase Lipitor to 40 mg daily   *If you need a refill on your cardiac medications before your next appointment, please call your pharmacy*   Lab Work: TSH, Free T3, Free T4  If you have labs (blood work) drawn today and your tests are completely normal, you will receive your results only by: Marland Kitchen. MyChart Message (if you have MyChart) OR . A paper copy in the mail If you have any lab test that is abnormal or we need to change your treatment, we will call you to review the results.  Follow-Up: At [facility], you and your health needs are our priority.  As part of our continuing mission to provide you with exceptional heart care, we have created designated Provider Care Teams.  These Care Teams include your primary Cardiologist (physician) and Advanced Practice Providers (APPs -  Physician Assistants and Nurse Practitioners) who all work together to provide you with the care you need, when you need it.  We recommend signing up for the patient portal called "MyChart".  Sign up information is provided on this After Visit Summary.  MyChart is used to connect with patients for Virtual Visits (Telemedicine).  Patients are able to view lab/test results, encounter notes, upcoming appointments, etc.  Non-urgent messages can be sent to your provider as well.   To learn more about what you can do with MyChart, go to [email or web address].    Your next appointment:   As needed  The format for your next appointment:   In Person  Provider:   Lennie OdorWesley O'Neal, MD        Time Spent with Patient: I have spent a  total of 25 minutes with patient reviewing hospital notes, telemetry, EKGs, labs and examining the patient as well as establishing an assessment and plan that  was discussed with the patient.  > 50% of time was spent in direct patient care.  Signed, Lenna Gilford. Flora Lipps, MD [facility]  [address] [place], [place] [address] [phone]  05/01/2020 9:11 AM

## 2020-05-01 ENCOUNTER — Encounter: Payer: Self-pay | Admitting: Cardiovascular Disease

## 2020-05-01 ENCOUNTER — Ambulatory Visit (INDEPENDENT_AMBULATORY_CARE_PROVIDER_SITE_OTHER): Payer: HRSA Program | Admitting: Cardiovascular Disease

## 2020-05-01 ENCOUNTER — Other Ambulatory Visit: Payer: Self-pay

## 2020-05-01 VITALS — BP 120/74 | HR 79 | Ht 62.5 in | Wt 170.0 lb

## 2020-05-01 DIAGNOSIS — R002 Palpitations: Secondary | ICD-10-CM | POA: Diagnosis not present

## 2020-05-01 DIAGNOSIS — E785 Hyperlipidemia, unspecified: Secondary | ICD-10-CM

## 2020-05-01 DIAGNOSIS — E782 Mixed hyperlipidemia: Secondary | ICD-10-CM

## 2020-05-01 DIAGNOSIS — R0602 Shortness of breath: Secondary | ICD-10-CM

## 2020-05-01 DIAGNOSIS — U071 COVID-19: Secondary | ICD-10-CM

## 2020-05-01 DIAGNOSIS — R7989 Other specified abnormal findings of blood chemistry: Secondary | ICD-10-CM

## 2020-05-01 DIAGNOSIS — Z8616 Personal history of COVID-19: Secondary | ICD-10-CM

## 2020-05-01 LAB — T4, FREE: Free T4: 1.07 ng/dL (ref 0.82–1.77)

## 2020-05-01 LAB — TSH: TSH: 5.89 u[IU]/mL — ABNORMAL HIGH (ref 0.450–4.500)

## 2020-05-01 LAB — T3, FREE: T3, Free: 3.3 pg/mL (ref 2.0–4.4)

## 2020-05-01 MED ORDER — ATORVASTATIN CALCIUM 40 MG PO TABS: 40.0000 mg | ORAL_TABLET | Freq: Every day | ORAL | 1 refills | Status: DC

## 2020-05-01 NOTE — Patient Instructions (Signed)
Medication Instructions:  Increase Lipitor to 40 mg daily   *If you need a refill on your cardiac medications before your next appointment, please call your pharmacy*   Lab Work: TSH, Free T3, Free T4  If you have labs (blood work) drawn today and your tests are completely normal, you will receive your results only by: Marland Kitchen MyChart Message (if you have MyChart) OR . A paper copy in the mail If you have any lab test that is abnormal or we need to change your treatment, we will call you to review the results.  Follow-Up: At [facility], you and your health needs are our priority.  As part of our continuing mission to provide you with exceptional heart care, we have created designated Provider Care Teams.  These Care Teams include your primary Cardiologist (physician) and Advanced Practice Providers (APPs -  Physician Assistants and Nurse Practitioners) who all work together to provide you with the care you need, when you need it.  We recommend signing up for the patient portal called "MyChart".  Sign up information is provided on this After Visit Summary.  MyChart is used to connect with patients for Virtual Visits (Telemedicine).  Patients are able to view lab/test results, encounter notes, upcoming appointments, etc.  Non-urgent messages can be sent to your provider as well.   To learn more about what you can do with MyChart, go to [email or web address].    Your next appointment:   As needed  The format for your next appointment:   In Person  Provider:   Lennie Odor, MD

## 2020-05-02 ENCOUNTER — Telehealth: Payer: Self-pay

## 2020-05-02 NOTE — Telephone Encounter (Signed)
Attempted to call pt, LVM for call back  see if pt  decided on doing a home sleep test.

## 2020-05-03 ENCOUNTER — Encounter: Payer: Self-pay | Admitting: Adult Health

## 2020-05-03 ENCOUNTER — Ambulatory Visit: Payer: Self-pay | Admitting: Adult Health

## 2020-05-03 VITALS — BP 124/82 | HR 69 | Ht 62.5 in | Wt 168.4 lb

## 2020-05-03 DIAGNOSIS — R0683 Snoring: Secondary | ICD-10-CM

## 2020-05-03 DIAGNOSIS — R0681 Apnea, not elsewhere classified: Secondary | ICD-10-CM

## 2020-05-03 DIAGNOSIS — R519 Headache, unspecified: Secondary | ICD-10-CM

## 2020-05-03 NOTE — Telephone Encounter (Signed)
Spoke to pt, she states she did not a have sleep study, she asked if she can have her daughter call back to speak to me so she can understand better.

## 2020-05-03 NOTE — Patient Instructions (Signed)
Your Plan:  Home sleep test ordered. Sleep lab will contact you    Thank you for coming to see [place] at [facility]. I hope we have been able to provide you high quality care today.  You may receive a patient satisfaction survey over the next few weeks. We would appreciate your feedback and comments so that we may continue to improve ourselves and the health of our patients.

## 2020-05-03 NOTE — Progress Notes (Signed)
PATIENT: Cheryl Patrick DOB: [birth year]  REASON FOR VISIT: follow up HISTORY FROM: patient  HISTORY OF PRESENT ILLNESS: Today 05/03/20:  [name] is a 49 year old female with a history of daytime sleepiness. She returns today for follow-up. She did have an overnight pulse oximetry that was abnormal. She has not had a home sleep test yet. She returns today states that she is ready to schedule this. Reports that her husband and grandchildren have told her that she snores. Reports that her husband has witnessed apnea events. She reports that she is tired throughout the day. Wakes up 2-3 mornings a week with a headache that typically gets better as the day goes on. She states that she still does not have insurance but is willing to pay out-of-pocket to have the home sleep test.  HISTORY Cheryl Patrick a 49 y.o. year old Hispanic female patientis seen here upon a referralon 01/25/2020 from PCP .  Chiefconcernaccording to patient : she contracted COVID 19 last year ( 11-24) and reportedly developed pain in hands and feet and couldn't sleep through the night, after 2 hours - with palpitations- couldn't concentrate in daytime" . "I have needles poking my feet. I am always tired- exhausted"  I have the pleasure of seeing Cheryl Patrick today,a right-handed Other or two or more races female with a possible sleep disorder. She  has a past medical history of Anxiety, Diabetes mellitus without complication (HCC), and Hyperlipemia. had  Covid November- December 2020. Blood glucose was uncontrolled.   She has not been able to refill her metformin through her PCP after losing insurance, there could be a complication of poorly controlled blood glucose. The patient felt she has not lost control of DM .   Familymedical /sleep history:No other family member on CPAP with OSA, insomnia, sleep walkers.  Social history:Patient is working as an Film/video editor, Audiological scientist-  and lives in a  household with spouse and son recently left home. 9-5.30PM.  Tobacco use- none .  ETOH use, rarely Caffeine intake in form of Coffee( 1 coffee in AM ) Soda( 2 week) Tea (/) or energy drinks. Regular exercise- seldomly        Sleep habits are as follows:The patient's dinner time is between 7 PM. The patient goes to bed at 10.30 PM and goes to sleep by about 30 minute- she continues to sleep for 2 hours, wakes for may be one bathroom break, but stays snoozing- not really deep sleep. The preferred sleep position is not-supine ( en spalda)-  and lateral , with the support of 1-2 pillows. Dreams are reportedly rare.  7 AM is the usual rise time.  She reports feeling refreshed / restored in AM, but within 3-4 hours I am tired again. symptoms such as dry mouth, morning headaches, and residual fatigue. Naps are taken infrequently.   REVIEW OF SYSTEMS: Out of a complete 14 system review of symptoms, the patient complains only of the following symptoms, and all other reviewed systems are negative.  Epworth sleepiness score 14  ALLERGIES: No Known Allergies  HOME MEDICATIONS: Outpatient Medications Prior to Visit  Medication Sig Dispense Refill  . acetaminophen (TYLENOL) 500 MG tablet Take 500 mg by mouth every 6 (six) hours as needed for moderate pain.     Marland Kitchen atorvastatin (LIPITOR) 40 MG tablet Take 1 tablet (40 mg total) by mouth daily. 90 tablet 1  . metFORMIN (GLUCOPHAGE) 500 MG tablet Take 1 tablet (500 mg total) by mouth 2 (two) times daily. 60  tablet 2   No facility-administered medications prior to visit.    PAST MEDICAL HISTORY: Past Medical History:  Diagnosis Date  . Anxiety   . Diabetes mellitus without complication (HCC)   . Hyperlipemia     PAST SURGICAL HISTORY: Past Surgical History:  Procedure Laterality Date  . DILATION AND EVACUATION N/A 05/10/2017   Procedure: DILATATION AND EVACUATION;  Surgeon: Badger Lee Bing, MD;  Location: [facility];  Service: Gynecology;   Laterality: N/A;  . NO PAST SURGERIES      FAMILY HISTORY: Family History  Problem Relation Age of Onset  . Lymphoma Mother   . Hypertension Mother   . Diabetes Mother   . Diabetes Father   . Hypertension Father     SOCIAL HISTORY: Social History   Socioeconomic History  . Marital status: Married    Spouse name: Not on file  . Number of children: 3  . Years of education: Not on file  . Highest education level: 9th grade  Occupational History  . Occupation: International aid/development worker: [employer]  Tobacco Use  . Smoking status: Never Smoker  . Smokeless tobacco: Never Used  Vaping Use  . Vaping Use: Never used  Substance and Sexual Activity  . Alcohol use: No  . Drug use: No  . Sexual activity: Yes    Birth control/protection: Rhythm  Other Topics Concern  . Not on file  Social History Narrative  . Not on file   Social Determinants of Health   Financial Resource Strain:   . Difficulty of Paying Living Expenses: Not on file  Food Insecurity:   . Worried About Programme researcher, broadcasting/film/video in the Last Year: Not on file  . Ran Out of Food in the Last Year: Not on file  Transportation Needs: No Transportation Needs  . Lack of Transportation (Medical): No  . Lack of Transportation (Non-Medical): No  Physical Activity:   . Days of Exercise per Week: Not on file  . Minutes of Exercise per Session: Not on file  Stress:   . Feeling of Stress : Not on file  Social Connections:   . Frequency of Communication with Friends and Family: Not on file  . Frequency of Social Gatherings with Friends and Family: Not on file  . Attends Religious Services: Not on file  . Active Member of Clubs or Organizations: Not on file  . Attends Banker Meetings: Not on file  . Marital Status: Not on file  Intimate Partner Violence:   . Fear of Current or Ex-Partner: Not on file  . Emotionally Abused: Not on file  . Physically Abused: Not on file  . Sexually Abused: Not on file       PHYSICAL EXAM  Vitals:   05/03/20 0956  BP: 124/82  Pulse: 69  Weight: 168 lb 6.4 oz (76.4 kg)  Height: 5' 2.5" (1.588 m)   Body mass index is 30.31 kg/m.  Generalized: Well developed, in no acute distress   Neurological examination  Mentation: Alert oriented to time, place, history taking. Follows all commands speech and language fluent Cranial nerve II-XII: Pupils were equal round reactive to light. Extraocular movements were full, visual field were full on confrontational test. Facial sensation and strength were normal. Uvula tongue midline. Mallampati 4+, neck circumference 15.5 inches. Head turning and shoulder shrug  were normal and symmetric. Motor: The motor testing reveals 5 over 5 strength of all 4 extremities. Good symmetric motor tone is noted throughout.  Gait and station: Gait is normal.     DIAGNOSTIC DATA (LABS, IMAGING, TESTING) - I reviewed patient records, labs, notes, testing and imaging myself where available.  Lab Results  Component Value Date   WBC 6.6 02/15/2020   HGB 14.3 02/15/2020   HCT 43.6 02/15/2020   MCV 83 02/15/2020   PLT 210 02/15/2020      Component Value Date/Time   NA 141 02/15/2020 0936   K 4.1 02/15/2020 0936   CL 104 02/15/2020 0936   CO2 21 02/15/2020 0936   GLUCOSE 167 (H) 03/20/2020 0925   GLUCOSE 255 (H) 07/04/2019 0012   BUN 12 02/15/2020 0936   CREATININE 0.74 02/15/2020 0936   CREATININE 0.64 07/06/2015 1912   CALCIUM 9.5 02/15/2020 0936   PROT 7.6 02/15/2020 0936   ALBUMIN 4.6 02/15/2020 0936   AST 23 02/15/2020 0936   ALT 20 02/15/2020 0936   ALKPHOS 97 02/15/2020 0936   BILITOT 0.3 02/15/2020 0936   GFRNONAA 96 02/15/2020 0936   GFRAA 111 02/15/2020 0936   Lab Results  Component Value Date   CHOL 164 03/20/2020   HDL 38 (L) 03/20/2020   LDLCALC 94 03/20/2020   TRIG 188 (H) 03/20/2020   CHOLHDL 4.3 03/20/2020   Lab Results  Component Value Date   HGBA1C 7.0 (H) 03/20/2020   No results found  for: VITAMINB12 Lab Results  Component Value Date   TSH 5.890 (H) 05/01/2020      ASSESSMENT AND PLAN 49 y.o. year old female  has a past medical history of Anxiety, Diabetes mellitus without complication (HCC), and Hyperlipemia. here with:  1. Daytime sleepiness 2. Snores 3. Morning headaches  Home sleep test ordered. The patient is self-pay--she had several questions about payment plans this was answered by her billing department. Follow-up after home sleep test  I spent 25 minutes of face-to-face and non-face-to-face time with patient.  This included previsit chart review, lab review, study review, order entry, electronic health record documentation, patient education.  Butch Penny, MSN, NP-C 05/03/2020, 10:02 AM [facility] [address] [place], [place] [address] [phone]

## 2020-05-04 ENCOUNTER — Telehealth: Payer: Self-pay

## 2020-05-04 NOTE — Telephone Encounter (Signed)
LVM to schedule HST °

## 2020-05-29 ENCOUNTER — Other Ambulatory Visit: Payer: Self-pay

## 2020-05-29 ENCOUNTER — Ambulatory Visit (INDEPENDENT_AMBULATORY_CARE_PROVIDER_SITE_OTHER): Payer: Self-pay | Admitting: Neurology

## 2020-05-29 DIAGNOSIS — R519 Headache, unspecified: Secondary | ICD-10-CM

## 2020-05-29 DIAGNOSIS — G4736 Sleep related hypoventilation in conditions classified elsewhere: Secondary | ICD-10-CM

## 2020-05-29 DIAGNOSIS — R0683 Snoring: Secondary | ICD-10-CM

## 2020-05-29 DIAGNOSIS — G933 Postviral fatigue syndrome: Secondary | ICD-10-CM

## 2020-05-29 DIAGNOSIS — G4733 Obstructive sleep apnea (adult) (pediatric): Secondary | ICD-10-CM

## 2020-05-29 DIAGNOSIS — G9331 Postviral fatigue syndrome: Secondary | ICD-10-CM

## 2020-05-29 DIAGNOSIS — R0681 Apnea, not elsewhere classified: Secondary | ICD-10-CM

## 2020-06-12 ENCOUNTER — Telehealth: Payer: Self-pay | Admitting: Neurology

## 2020-06-12 DIAGNOSIS — G933 Postviral fatigue syndrome: Secondary | ICD-10-CM | POA: Insufficient documentation

## 2020-06-12 DIAGNOSIS — G9331 Postviral fatigue syndrome: Secondary | ICD-10-CM | POA: Insufficient documentation

## 2020-06-12 DIAGNOSIS — R0681 Apnea, not elsewhere classified: Secondary | ICD-10-CM | POA: Insufficient documentation

## 2020-06-12 DIAGNOSIS — U071 COVID-19: Secondary | ICD-10-CM

## 2020-06-12 DIAGNOSIS — G4736 Sleep related hypoventilation in conditions classified elsewhere: Secondary | ICD-10-CM

## 2020-06-12 DIAGNOSIS — R519 Headache, unspecified: Secondary | ICD-10-CM | POA: Insufficient documentation

## 2020-06-12 NOTE — Telephone Encounter (Signed)
Called the pt through spanish interpretor service.  Reviewed the sleep study results in detail with the patient. Advised that the severe sleep apnea will be best treated with auto CPAP. Advised that we would send the order to DME company [employer]. Pt doesn't have insurance, so the company will contact her and review the best financial options with her. Pt verbalized understanding. Pt will call once scheduled with the machine to schedule a follow up visit. Since she is non insured and she would be paying out of pocket for machine this would not be a timed visit. She isn't required to follow up in 31-90 days however if she can follow up in that time frame that would be beneficial to make sure she is being treated appropriately. She will call if she has any issues.

## 2020-06-12 NOTE — Procedures (Signed)
Sleep Study Report   Patient Information     First Name: Cheryl Last Name: Ermalinda Patrick: [id number]  Birth Date: [birth year] Age: 49 Gender: Female  Referring Provider: Butch Penny, NP BMI: 30.8 (W=167 lb, H=5' 2'')  Neck Circ.:  15 '' Epworth:  14/24   Sleep Study Information    Study Date: 05/29/20 S/H/A Version: [id number] / 4.2.1023 / 79  History:    Cheryl Patrick is a 49 year old female with a history of daytime sleepiness, Post Covid Viral fatigue syndrome, at times poorly controlled DM, and hyperlipidemia. She did have an overnight pulse oximetry that was abnormal. She has not had a home sleep test yet. She returns today states that she is now ready to schedule this. Reports that her husband and grandchildren have told her that she snores. Reports that her husband has witnessed apnea events. She reports that she is tired throughout the day. Wakes up 2-3 mornings a week with a headache that typically gets better as the day goes on.  She states that she still does not have insurance but is willing to pay to have the home sleep test.  Summary & Diagnosis:      This HST recorded over 7 h and 30 minutes of sleep time associated with severe OSA at AHI of 30.1/h. there was no REM/ NREM sleep association and variability of heart rate was normal. No prolonged hypoxemia was seen now.   Recommendations:     This type of sleep apnea can be treated by CPAP, dental device or inspire device. The patient would find most likely the fasted response by using CPAP, (possibly with a refurbished machine if uninsured).  I would set the autotitration device for 5-16 cm water, 3 cm EPR and with heated humidification. The patient should choose a mask of her comfort.   Interpreting Physician: Melvyn Novas, MD          Sleep Summary  Oxygen Saturation Statistics   Start Study Time: End Study Time: Total Recording Time:          10:35:54 PM 7:11:29 AM   8 h, 35 min  Total Sleep Time % REM of Sleep  Time:  7 h, 33 min  25.9    Mean: 96 Minimum: 85 Maximum: 99  Mean of Desaturations Nadirs (%):   93  Oxygen Desaturation. %:  4-9 10-20 >20 Total  Events Number Total   132  2 98.5 1.5  0 0.0  134 100.0  Oxygen Saturation: <90 <=88 <85 <80 <70  Duration (minutes): Sleep % 0.5 0.1 0.3 0.0 0.1 0.0 0.0 0.0 0.0 0.0     Respiratory Indices      Total Events REM NREM All Night  pRDI: pAHI 3%:  268  226 33.4 24.7 36.5 32.0 35.7 30.1  ODI 4%: pAHI 4%:  134 164 13.9 19.2 17.8 21.8       Pulse Rate Statistics during Sleep (BPM)      Mean: 71 Minimum: 53 Maximum: 102    Indices are calculated using technically valid sleep time of 7 h, 30 min.                                                 pAHI=30.1  Mild              Moderate                    Severe                                                 5              15                    30  PAT  Respiratory Events       60  70  80  90  100          Oxygen Saturation: / Pulse Rate (BPM) / PAT Amplitude  SaO2 (%)         50  60  70  80  90  100  110     Pulse Rate (BPM)          0  200  400  600  800  1000  1200  1400      PAT Amp                        22:3  5   23:0  5   23:3  5  0  0:05  0  0:35  0  1:05  0  1:35  0  2:05  0  2:35  0  3:05  0  3:35  0  4:05  0  4:35  0  5:05  0  5:35  0  6:05  0  6:35  0  7:05      Wake / Sleep stages   L Sleep   D Sleep   Wake   REM

## 2020-06-12 NOTE — Telephone Encounter (Signed)
Sleep Study Report   Patient Information     First Name: Cheryl Last Name: Patrick ID: [id number]  Birth Date: 07/17/1971 Age: 49 Gender: Female  Referring Provider: Megan Millikan, NP BMI: 30.8 (W=167 lb, H=5' 2'')  Neck Circ.:  15 '' Epworth:  14/24   Sleep Study Information    Study Date: 05/29/20 S/H/A Version: 5.2.79.8 / 4.2.1023 / 79  History:    [name] is a 49-year-old female with a history of daytime sleepiness, Post Covid Viral fatigue syndrome, at times poorly controlled DM, and hyperlipidemia. She did have an overnight pulse oximetry that was abnormal. She has not had a home sleep test yet. She returns today states that she is now ready to schedule this. Reports that her husband and grandchildren have told her that she snores. Reports that her husband has witnessed apnea events. She reports that she is tired throughout the day. Wakes up 2-3 mornings a week with a headache that typically gets better as the day goes on.  She states that she still does not have insurance but is willing to pay to have the home sleep test.  Summary & Diagnosis:      This HST recorded over 7 h and 30 minutes of sleep time associated with severe OSA at AHI of 30.1/h. there was no REM/ NREM sleep association and variability of heart rate was normal. No prolonged hypoxemia was seen now.   Recommendations:     This type of sleep apnea can be treated by CPAP, dental device or inspire device. The patient would find most likely the fasted response by using CPAP, (possibly with a refurbished machine if uninsured).  I would set the autotitration device for 5-16 cm water, 3 cm EPR and with heated humidification. The patient should choose a mask of her comfort.   Interpreting Physician: Terrill Wauters, MD          Sleep Summary  Oxygen Saturation Statistics   Start Study Time: End Study Time: Total Recording Time:          10:35:54 PM 7:11:29 AM   8 h, 35 min  Total Sleep Time % REM of Sleep  Time:  7 h, 33 min  25.9    Mean: 96 Minimum: 85 Maximum: 99  Mean of Desaturations Nadirs (%):   93  Oxygen Desaturation. %:  4-9 10-20 >20 Total  Events Number Total   132  2 98.5 1.5  0 0.0  134 100.0  Oxygen Saturation: <90 <=88 <85 <80 <70  Duration (minutes): Sleep % 0.5 0.1 0.3 0.0 0.1 0.0 0.0 0.0 0.0 0.0     Respiratory Indices      Total Events REM NREM All Night  pRDI: pAHI 3%:  268  226 33.4 24.7 36.5 32.0 35.7 30.1  ODI 4%: pAHI 4%:  134 164 13.9 19.2 17.8 21.8       Pulse Rate Statistics during Sleep (BPM)      Mean: 71 Minimum: 53 Maximum: 102    Indices are calculated using technically valid sleep time of 7 h, 30 min.                                                 pAHI=30.1                                                            Mild              Moderate                    Severe                                                 5              15                    30  PAT  Respiratory Events       60  70  80  90  100          Oxygen Saturation: / Pulse Rate (BPM) / PAT Amplitude  SaO2 (%)         50  60  70  80  90  100  110     Pulse Rate (BPM)          0  200  400  600  800  1000  1200  1400      PAT Amp                        22:3  5   23:0  5   23:3  5  0  0:05  0  0:35  0  1:05  0  1:35  0  2:05  0  2:35  0  3:05  0  3:35  0  4:05  0  4:35  0  5:05  0  5:35  0  6:05  0  6:35  0  7:05      Wake / Sleep stages   L Sleep   D Sleep   Wake   REM

## 2020-08-02 ENCOUNTER — Encounter: Payer: Self-pay | Admitting: Nurse Practitioner

## 2020-08-02 ENCOUNTER — Other Ambulatory Visit: Payer: Self-pay

## 2020-08-02 ENCOUNTER — Ambulatory Visit: Payer: Self-pay | Attending: Nurse Practitioner | Admitting: Nurse Practitioner

## 2020-08-02 VITALS — BP 115/63 | HR 68 | Temp 98.7°F | Ht 62.5 in | Wt 169.0 lb

## 2020-08-02 DIAGNOSIS — K219 Gastro-esophageal reflux disease without esophagitis: Secondary | ICD-10-CM

## 2020-08-02 DIAGNOSIS — D72819 Decreased white blood cell count, unspecified: Secondary | ICD-10-CM

## 2020-08-02 DIAGNOSIS — R7989 Other specified abnormal findings of blood chemistry: Secondary | ICD-10-CM

## 2020-08-02 DIAGNOSIS — Z1211 Encounter for screening for malignant neoplasm of colon: Secondary | ICD-10-CM

## 2020-08-02 DIAGNOSIS — Z23 Encounter for immunization: Secondary | ICD-10-CM

## 2020-08-02 DIAGNOSIS — E1165 Type 2 diabetes mellitus with hyperglycemia: Secondary | ICD-10-CM

## 2020-08-02 DIAGNOSIS — Z1159 Encounter for screening for other viral diseases: Secondary | ICD-10-CM

## 2020-08-02 LAB — POCT GLYCOSYLATED HEMOGLOBIN (HGB A1C): Hemoglobin A1C: 6.8 % — AB (ref 4.0–5.6)

## 2020-08-02 LAB — GLUCOSE, POCT (MANUAL RESULT ENTRY): POC Glucose: 128 mg/dl — AB (ref 70–99)

## 2020-08-02 MED ORDER — METFORMIN HCL 500 MG PO TABS: 500.0000 mg | ORAL_TABLET | Freq: Two times a day (BID) | ORAL | 2 refills | Status: DC

## 2020-08-02 MED ORDER — FAMOTIDINE 40 MG PO TABS: 40.0000 mg | ORAL_TABLET | Freq: Every day | ORAL | 1 refills | Status: DC

## 2020-08-02 NOTE — Progress Notes (Signed)
Assessment & Plan:  Cheryl Patrick was seen today for follow-up.  Diagnoses and all orders for this visit:  Elevated TSH -     Thyroid Panel With TSH -     Basic metabolic panel  Type 2 diabetes mellitus with hyperglycemia, without long-term current use of insulin (HCC) -     Glucose (CBG) -     HgB A1c -     Basic metabolic panel -     Lipid panel -     metFORMIN (GLUCOPHAGE) 500 MG tablet; Take 1 tablet (500 mg total) by mouth 2 (two) times daily. Continue blood sugar control as discussed in office today, low carbohydrate diet, and regular physical exercise as tolerated, 150 minutes per week (30 min each day, 5 days per week, or 50 min 3 days per week). Keep blood sugar logs with fasting goal of 90-130 mg/dl, post prandial (after you eat) less than 180.  For Hypoglycemia: BS <60 and Hyperglycemia BS >400; contact the clinic ASAP. Annual eye exams and foot exams are recommended.   Need for hepatitis C screening test -     HCV Ab w Reflex to Quant PCR  Leukopenia, unspecified type -     CBC  Colon cancer screening -     Fecal occult blood, imunochemical([facility])  GERD without esophagitis -     famotidine (PEPCID) 40 MG tablet; Take 1 tablet (40 mg total) by mouth daily. INSTRUCTIONS: Avoid GERD Triggers: acidic, spicy or fried foods, caffeine, coffee, sodas,  alcohol and chocolate.     Patient has been counseled on age-appropriate routine health concerns for screening and prevention. These are reviewed and up-to-date. Referrals have been placed accordingly. Immunizations are up-to-date or declined.    Subjective:   Chief Complaint  Patient presents with  . Follow-up    Pt. Is here for thyroid and diabetes follow up.    HPI Cheryl Patrick 50 y.o. female presents to office today for repeat TSH and A1c  has a past medical history of COVID 19 infection, Anxiety, Diabetes mellitus without complication (HCC), and Hyperlipemia. VRI was used to communicate directly with  patient for the entire encounter including providing detailed patient instructions.   Had COVID vaccine April 2021 but can not recall exact dates. Will bring card for next visit.   UTD on mammogram and PAP  She has complaints of "increased acidity" which causes pain in the mid abdomen. Not occurring daily but frequently. She has a history of dyspepsia. Takes tums periodically which provide some relief of symptoms.    DM2 Taking metformin 500 mg BID as prescribed. LDL nearing goal. Taking atorvastatin 40 mg daily as prescribed.  Lab Results  Component Value Date   HGBA1C 6.8 (A) 08/02/2020   Lab Results  Component Value Date   HGBA1C 7.0 (H) 03/20/2020   Lab Results  Component Value Date   LDLCALC 94 03/20/2020     Subclinical Hypothyroidism Lab Results  Component Value Date   TSH 5.890 (H) 05/01/2020    Review of Systems  Constitutional: Negative for fever, malaise/fatigue and weight loss.  HENT: Negative.  Negative for nosebleeds.   Eyes: Negative.  Negative for blurred vision, double vision and photophobia.  Respiratory: Negative.  Negative for cough and shortness of breath.   Cardiovascular: Positive for palpitations (intermittently). Negative for chest pain and leg swelling.  Gastrointestinal: Positive for heartburn. Negative for nausea and vomiting.  Musculoskeletal: Negative.  Negative for myalgias.  Neurological: Negative.  Negative for dizziness, focal weakness,  seizures and headaches.  Psychiatric/Behavioral: Positive for memory loss. Negative for suicidal ideas.    Past Medical History:  Diagnosis Date  . Anxiety   . Diabetes mellitus without complication (HCC)   . Hyperlipemia     Past Surgical History:  Procedure Laterality Date  . DILATION AND EVACUATION N/A 05/10/2017   Procedure: DILATATION AND EVACUATION;  Surgeon: Donahue Bing, MD;  Location: [facility];  Service: Gynecology;  Laterality: N/A;  . NO PAST SURGERIES      Family History  Problem  Relation Age of Onset  . Lymphoma Mother   . Hypertension Mother   . Diabetes Mother   . Diabetes Father   . Hypertension Father     Social History Reviewed with no changes to be made today.   Outpatient Medications Prior to Visit  Medication Sig Dispense Refill  . acetaminophen (TYLENOL) 500 MG tablet Take 500 mg by mouth every 6 (six) hours as needed for moderate pain.     Marland Kitchen atorvastatin (LIPITOR) 40 MG tablet Take 1 tablet (40 mg total) by mouth daily. 90 tablet 1  . Misc Natural Products (T-RELIEF CBD+13 SL) Place 1,500 mg under the tongue in the morning.    . metFORMIN (GLUCOPHAGE) 500 MG tablet Take 1 tablet (500 mg total) by mouth 2 (two) times daily. 60 tablet 2   No facility-administered medications prior to visit.    No Known Allergies     Objective:    BP 115/63 (BP Location: Left Arm, Patient Position: Sitting, Cuff Size: Normal)   Pulse 68   Temp 98.7 F (37.1 C) (Oral)   Ht 5' 2.5" (1.588 m)   Wt 169 lb (76.7 kg)   SpO2 97%   BMI 30.42 kg/m  Wt Readings from Last 3 Encounters:  08/02/20 169 lb (76.7 kg)  05/03/20 168 lb 6.4 oz (76.4 kg)  05/01/20 170 lb (77.1 kg)    Physical Exam Vitals and nursing note reviewed.  Constitutional:      Appearance: She is well-developed and well-nourished.  HENT:     Head: Normocephalic and atraumatic.  Eyes:     Extraocular Movements: EOM normal.  Cardiovascular:     Rate and Rhythm: Normal rate and regular rhythm.     Pulses: Intact distal pulses.     Heart sounds: Normal heart sounds. No murmur heard. No friction rub. No gallop.   Pulmonary:     Effort: Pulmonary effort is normal. No tachypnea or respiratory distress.     Breath sounds: Normal breath sounds. No decreased breath sounds, wheezing, rhonchi or rales.  Chest:     Chest wall: No tenderness.  Abdominal:     General: Bowel sounds are normal.     Palpations: Abdomen is soft.  Musculoskeletal:        General: No edema. Normal range of motion.      Cervical back: Normal range of motion.  Skin:    General: Skin is warm and dry.  Neurological:     Mental Status: She is alert and oriented to person, place, and time.     Coordination: Coordination normal.  Psychiatric:        Mood and Affect: Mood and affect normal.        Behavior: Behavior normal. Behavior is cooperative.        Thought Content: Thought content normal.        Judgment: Judgment normal.          Patient has been counseled extensively about nutrition  and exercise as well as the importance of adherence with medications and regular follow-up. The patient was given clear instructions to go to ER or return to medical center if symptoms don't improve, worsen or new problems develop. The patient verbalized understanding.   Follow-up: Return in about 3 months (around 10/31/2020).   Gildardo Pounds, FNP-BC [facility] and [facility], [place]   08/02/2020, 8:59 AM

## 2020-08-03 LAB — CBC
Hematocrit: 41.7 % (ref 34.0–46.6)
Hemoglobin: 13.8 g/dL (ref 11.1–15.9)
MCH: 27.2 pg (ref 26.6–33.0)
MCHC: 33.1 g/dL (ref 31.5–35.7)
MCV: 82 fL (ref 79–97)
Platelets: 227 10*3/uL (ref 150–450)
RBC: 5.08 x10E6/uL (ref 3.77–5.28)
RDW: 13.1 % (ref 11.7–15.4)
WBC: 8.1 10*3/uL (ref 3.4–10.8)

## 2020-08-03 LAB — LIPID PANEL
Chol/HDL Ratio: 4 ratio (ref 0.0–4.4)
Cholesterol, Total: 172 mg/dL (ref 100–199)
HDL: 43 mg/dL (ref >39)
LDL Chol Calc (NIH): 106 mg/dL — ABNORMAL HIGH (ref 0–99)
Triglycerides: 131 mg/dL (ref 0–149)
VLDL Cholesterol Cal: 23 mg/dL (ref 5–40)

## 2020-08-03 LAB — BASIC METABOLIC PANEL
BUN/Creatinine Ratio: 28 — ABNORMAL HIGH (ref 9–23)
BUN: 17 mg/dL (ref 6–24)
CO2: 19 mmol/L — ABNORMAL LOW (ref 20–29)
Calcium: 9.2 mg/dL (ref 8.7–10.2)
Chloride: 105 mmol/L (ref 96–106)
Creatinine, Ser: 0.6 mg/dL (ref 0.57–1.00)
GFR calc Af Amer: 125 mL/min/{1.73_m2} (ref >59)
GFR calc non Af Amer: 108 mL/min/{1.73_m2} (ref >59)
Glucose: 119 mg/dL — ABNORMAL HIGH (ref 65–99)
Potassium: 4.2 mmol/L (ref 3.5–5.2)
Sodium: 139 mmol/L (ref 134–144)

## 2020-08-03 LAB — HCV AB W REFLEX TO QUANT PCR: HCV Ab: <0.1 s/co ratio (ref 0.0–0.9)

## 2020-08-03 LAB — HCV INTERPRETATION

## 2020-08-03 LAB — THYROID PANEL WITH TSH
Free Thyroxine Index: 1.5 (ref 1.2–4.9)
T3 Uptake Ratio: 29 % (ref 24–39)
T4, Total: 5.1 ug/dL (ref 4.5–12.0)
TSH: 1.51 u[IU]/mL (ref 0.450–4.500)

## 2020-08-04 LAB — FECAL OCCULT BLOOD, IMMUNOCHEMICAL: Fecal Occult Bld: NEGATIVE

## 2020-10-10 ENCOUNTER — Other Ambulatory Visit: Payer: Self-pay | Admitting: Podiatry

## 2020-10-10 MED ORDER — TERBINAFINE HCL 250 MG PO TABS: 250.0000 mg | ORAL_TABLET | Freq: Every day | ORAL | 0 refills | Status: DC

## 2020-10-10 NOTE — Progress Notes (Signed)
PRN onychomycosis of toenails 

## 2020-10-28 ENCOUNTER — Other Ambulatory Visit: Payer: Self-pay

## 2020-11-16 ENCOUNTER — Emergency Department (HOSPITAL_COMMUNITY): Payer: No Typology Code available for payment source

## 2020-11-16 ENCOUNTER — Other Ambulatory Visit: Payer: Self-pay

## 2020-11-16 ENCOUNTER — Emergency Department (HOSPITAL_COMMUNITY)
Admission: EM | Admit: 2020-11-16 | Discharge: 2020-11-16 | Disposition: A | Discharge status: Home / Self Care | Payer: No Typology Code available for payment source | Attending: Emergency Medicine | Admitting: Emergency Medicine

## 2020-11-16 DIAGNOSIS — Z7984 Long term (current) use of oral hypoglycemic drugs: Secondary | ICD-10-CM | POA: Insufficient documentation

## 2020-11-16 DIAGNOSIS — R0602 Shortness of breath: Secondary | ICD-10-CM | POA: Insufficient documentation

## 2020-11-16 DIAGNOSIS — R0789 Other chest pain: Secondary | ICD-10-CM | POA: Insufficient documentation

## 2020-11-16 DIAGNOSIS — E119 Type 2 diabetes mellitus without complications: Secondary | ICD-10-CM | POA: Insufficient documentation

## 2020-11-16 DIAGNOSIS — R110 Nausea: Secondary | ICD-10-CM | POA: Insufficient documentation

## 2020-11-16 DIAGNOSIS — R61 Generalized hyperhidrosis: Secondary | ICD-10-CM | POA: Insufficient documentation

## 2020-11-16 DIAGNOSIS — Z8616 Personal history of COVID-19: Secondary | ICD-10-CM | POA: Insufficient documentation

## 2020-11-16 LAB — CBC
HCT: 39.9 % (ref 36.0–46.0)
Hemoglobin: 12.8 g/dL (ref 12.0–15.0)
MCH: 27.2 pg (ref 26.0–34.0)
MCHC: 32.1 g/dL (ref 30.0–36.0)
MCV: 84.9 fL (ref 80.0–100.0)
Platelets: 229 10*3/uL (ref 150–400)
RBC: 4.7 MIL/uL (ref 3.87–5.11)
RDW: 13.2 % (ref 11.5–15.5)
WBC: 8.2 10*3/uL (ref 4.0–10.5)
nRBC: 0 % (ref 0.0–0.2)

## 2020-11-16 LAB — BASIC METABOLIC PANEL
Anion gap: 8 (ref 5–15)
BUN: 17 mg/dL (ref 6–20)
CO2: 23 mmol/L (ref 22–32)
Calcium: 8.9 mg/dL (ref 8.9–10.3)
Chloride: 107 mmol/L (ref 98–111)
Creatinine, Ser: 0.6 mg/dL (ref 0.44–1.00)
GFR, Estimated: >60 mL/min (ref >60)
Glucose, Bld: 100 mg/dL — ABNORMAL HIGH (ref 70–99)
Potassium: 4.1 mmol/L (ref 3.5–5.1)
Sodium: 138 mmol/L (ref 135–145)

## 2020-11-16 LAB — HEPATIC FUNCTION PANEL
ALT: 19 U/L (ref 0–44)
AST: 22 U/L (ref 15–41)
Albumin: 3.7 g/dL (ref 3.5–5.0)
Alkaline Phosphatase: 80 U/L (ref 38–126)
Bilirubin, Direct: 0.2 mg/dL (ref 0.0–0.2)
Indirect Bilirubin: 0.3 mg/dL (ref 0.3–0.9)
Total Bilirubin: 0.5 mg/dL (ref 0.3–1.2)
Total Protein: 6.6 g/dL (ref 6.5–8.1)

## 2020-11-16 LAB — D-DIMER, QUANTITATIVE: D-Dimer, Quant: 0.32 ug/mL-FEU (ref 0.00–0.50)

## 2020-11-16 LAB — LIPASE, BLOOD: Lipase: 35 U/L (ref 11–51)

## 2020-11-16 LAB — TROPONIN I (HIGH SENSITIVITY)
Troponin I (High Sensitivity): <2 ng/L (ref <18)
Troponin I (High Sensitivity): <2 ng/L (ref <18)

## 2020-11-16 NOTE — ED Triage Notes (Signed)
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 Given 324 ASA. Pt pain improved with rest.

## 2020-11-16 NOTE — ED Provider Notes (Signed)
[facility] EMERGENCY DEPARTMENT Provider Note   CSN: [id number] Arrival date & time: 11/16/20  1845     History Chief Complaint  Patient presents with  . Chest Pain    Cheryl Patrick is a 50 y.o. female.  The history is provided by the patient and medical records.  Chest Pain  Cheryl Patrick is a 50 y.o. female who presents to the Emergency Department complaining of chest pain. She presents the emergency department complaining of chest pain. At 5 PM while she was at work she developed sharp, left sided chest pain. She has associated shortness of breath, diaphoresis and nausea. She received 324 mg of aspirin prior to ED arrival. She had a similar episode about one year ago and did not seek immediate treatment at that time. She felt like she was going to pass out during the episode. Overall symptoms are improving but she does have some mild chest discomfort. She does not smoke. No hormone use. She has a family history of coronary artery disease and her mother in her 39s. She has a history of diabetes, hyperlipidemia.  Past Medical History:  Diagnosis Date  . Anxiety   . Diabetes mellitus without complication (HCC)   . Hyperlipemia     Patient Active Problem List   Diagnosis Date Noted  . PVFS (postviral fatigue syndrome) 06/12/2020  . Hypoxemia associated with sleep 06/12/2020  . Morning headache 06/12/2020  . Witnessed episode of apnea 06/12/2020  . Insomnia 01/03/2020  . Snoring 01/03/2020  . Memory loss 01/03/2020  . Frequent headaches 01/03/2020  . History of COVID-19 01/03/2020  . Irregular heart rate 01/03/2020  . Palpitations 01/03/2020  . Fatigue 01/03/2020  . COVID-19 virus infection 06/30/2019  . CAP (community acquired pneumonia) 06/30/2019  . DYSPEPSIA 09/20/2009  . ABDOMINAL PAIN OTHER SPECIFIED SITE 09/20/2009  . CHEST PAIN 09/12/2008    Past Surgical History:  Procedure Laterality Date  . DILATION AND EVACUATION N/A 05/10/2017    Procedure: DILATATION AND EVACUATION;  Surgeon: Goldstream Bing, MD;  Location: [facility];  Service: Gynecology;  Laterality: N/A;  . NO PAST SURGERIES       OB History    Gravida  5   Para  3   Term  2   Preterm  1   AB      Living  3     SAB      IAB      Ectopic      Multiple      Live Births  3           Family History  Problem Relation Age of Onset  . Lymphoma Mother   . Hypertension Mother   . Diabetes Mother   . Diabetes Father   . Hypertension Father     Social History   Tobacco Use  . Smoking status: Never Smoker  . Smokeless tobacco: Never Used  Vaping Use  . Vaping Use: Never used  Substance Use Topics  . Alcohol use: No  . Drug use: No    Home Medications Prior to Admission medications   Medication Sig Start Date End Date Taking? Authorizing Provider  acetaminophen (TYLENOL) 500 MG tablet Take 500 mg by mouth every 6 (six) hours as needed for moderate pain.     [provider]  atorvastatin (LIPITOR) 20 MG tablet TOME 1 PASTILLA POR VIA ORAL UNA VEZ AL DIA 02/09/20 02/08/21  Claiborne Rigg, NP  atorvastatin (LIPITOR) 40 MG tablet Take 1  tablet (40 mg total) by mouth daily. 05/01/20   O'NealRonnald Ramp, MD  famotidine (PEPCID) 40 MG tablet Take 1 tablet (40 mg total) by mouth daily. 08/02/20 09/01/20  Claiborne Rigg, NP  metFORMIN (GLUCOPHAGE) 500 MG tablet Take 1 tablet (500 mg total) by mouth 2 (two) times daily. 08/02/20 10/31/20  Claiborne Rigg, NP  Misc Natural Products (T-RELIEF CBD+13 SL) Place 1,500 mg under the tongue in the morning.    [provider]  terbinafine (LAMISIL) 250 MG tablet Take 1 tablet (250 mg total) by mouth daily. 10/10/20   Felecia Shelling, DPM    Allergies    Patient has no known allergies.  Review of Systems   Review of Systems  Cardiovascular: Positive for chest pain.  All other systems reviewed and are negative.   Physical Exam Updated Vital Signs BP 129/85   Pulse 65   Temp 98.5 F  (36.9 C)   Resp 17   Ht 5\' 2"  (1.575 m)   Wt 75.8 kg   SpO2 99%   BMI 30.54 kg/m   Physical Exam Vitals and nursing note reviewed.  Constitutional:      Appearance: She is well-developed.  HENT:     Head: Normocephalic and atraumatic.  Cardiovascular:     Rate and Rhythm: Normal rate and regular rhythm.     Heart sounds: No murmur heard.   Pulmonary:     Effort: Pulmonary effort is normal. No respiratory distress.     Breath sounds: Normal breath sounds.  Abdominal:     Palpations: Abdomen is soft.     Tenderness: There is no guarding or rebound.     Comments: Mild left upper quadrant tenderness  Musculoskeletal:        General: No swelling or tenderness.  Skin:    General: Skin is warm and dry.  Neurological:     Mental Status: She is alert and oriented to person, place, and time.  Psychiatric:        Behavior: Behavior normal.     ED Results / Procedures / Treatments   Labs (all labs ordered are listed, but only abnormal results are displayed) Labs Reviewed  BASIC METABOLIC PANEL - Abnormal; Notable for the following components:      Result Value   Glucose, Bld 100 (*)    All other components within normal limits  D-DIMER, QUANTITATIVE  HEPATIC FUNCTION PANEL  LIPASE, BLOOD  CBC  TROPONIN I (HIGH SENSITIVITY)  TROPONIN I (HIGH SENSITIVITY)    EKG EKG Interpretation  Date/Time:  Thursday November 16 2020 18:45:42 EDT Ventricular Rate:  67 PR Interval:  139 QRS Duration: 81 QT Interval:  404 QTC Calculation: 427 R Axis:   81 Text Interpretation: Sinus rhythm Confirmed by 07-12-1984 [phone]) on 11/16/2020 6:57:48 PM   Radiology DG Chest Portable 1 View  Result Date: 11/16/2020 CLINICAL DATA:  51 year old female with chest pain. EXAM: PORTABLE CHEST 1 VIEW COMPARISON:  Chest radiograph dated 06/30/2019. FINDINGS: The heart size and mediastinal contours are within normal limits. Both lungs are clear. The visualized skeletal structures are  unremarkable. IMPRESSION: No active disease. Electronically Signed   By: 14/08/2018 M.D.   On: 11/16/2020 19:18    Procedures Procedures   Medications Ordered in ED Medications - No data to display  ED Course  I have reviewed the triage vital signs and the nursing notes.  Pertinent labs & imaging results that were available during my care of the patient were  reviewed by me and considered in my medical decision making (see chart for details).    MDM Rules/Calculators/A&P                         patient here for evaluation of chest pain with shortness of breath and diaphoresis. Symptoms are resolving on ED presentation. EKG is without acute ischemic changes and troponin is negative times two. Presentation is not consistent with PE, degenerates negative. Doubt ACS, dissection, pneumonia. Discussed with patient homecare for chest pain, unclear source. Discussed outpatient follow-up and return precautions.  Final Clinical Impression(s) / ED Diagnoses Final diagnoses:  Atypical chest pain    Rx / DC Orders ED Discharge Orders    None       Tilden Fossa, MD 11/16/20 2246

## 2020-11-16 NOTE — ED Notes (Signed)
Recollected blood samples and sent to lab at this time

## 2020-12-13 ENCOUNTER — Telehealth: Payer: Self-pay

## 2020-12-13 NOTE — Telephone Encounter (Addendum)
Health Coaching 3  interpreter- Natale Lay, [facility]   Goals- Patient has been struggling recently with health issues related to COVID. Patient has been dealing with chest pain and has been referred to cardiologist for follow-up. Patient has been unable to exercise since she has been dealing with these issues. Encouraged patient to follow the doctor's advice in regards to exercise.    New goal- Since past has been unable to exercise recently new goal will be to work on a healthy diet in the mean time until she is able to exercise again. Spoke with patient about trying to maintain well balanced diet from the different food groups. Encouraged patient to continue to try and practice a heart healthy diet and to try and avoid fried and fatty foods whenever possible. Patient has been started on cholesterol medication recently. Spoke with patient about trying to add in whole grains and heart healthy fish into diet.   Barrier to reaching goal-    Strategies to overcome-    Navigation:  Patient is aware of a follow up session. Patient is scheduled for follow-up appointment on January 10, 2021 @ 9:30 am.   Time- 10 minutes

## 2021-01-10 ENCOUNTER — Other Ambulatory Visit: Payer: Self-pay

## 2021-01-10 ENCOUNTER — Inpatient Hospital Stay: Payer: Self-pay | Attending: Obstetrics and Gynecology | Admitting: *Deleted

## 2021-01-10 VITALS — BP 130/86 | Ht 62.5 in | Wt 162.9 lb

## 2021-01-10 DIAGNOSIS — Z1231 Encounter for screening mammogram for malignant neoplasm of breast: Secondary | ICD-10-CM

## 2021-01-10 DIAGNOSIS — Z0000 Encounter for general adult medical examination without abnormal findings: Secondary | ICD-10-CM

## 2021-01-10 NOTE — Progress Notes (Signed)
[facility] follow up   Interpreter: Wyvonne Lenz  Clinical Measurement:   Vitals:   01/10/21 0929 01/10/21 0945  BP: 138/88 130/86      Medical History:  Patient states that she has high cholesterol, does not have high blood pressure and she has diabetes.  Medications:  Patient states that she does take medication to lower cholesterol and blood sugar. Patient states that she does not take medication to lower blood pressure. Patient does not take an aspirin a day to help prevent a heart attack or stroke. During the past 7 days patient has taken prescribed medication to lower cholesterol and blood sugar on 7 days.   Blood pressure, self measurement:  :  Patient states that she does not measure blood pressure from home. She checks her blood pressure N/A. She shares her readings with a health care provider: N/A.   Nutrition: Patient states that on average she eats 3 cups of fruit and 2 cups of vegetables per day. Patient states that she does eat fish at least 2 times per week. Patient eats less than half servings of whole grains. Patient drinks less than 36 ounces of beverages with added sugar weekly: yes. Patient is currently watching sodium or salt intake: yes. In the past 7 days patient has had 0 drinks containing alcohol. On average patient drinks 0 drinks containing alcohol per day.      Physical activity:  Patient states that she gets 105 minutes of moderate and 0 minutes of vigorous physical activity each week.  Smoking status:  Patient states that she has has never smoked .   Quality of life:  Over the past 2 weeks patient states that she had little interest or pleasure in doing things: not at all. She has been feeling down, depressed or hopeless:not at all.    Risk reduction and counseling:   Health Coaching: Spoke with patient about continuing to practice a heart healthy diet. Spoke with patient about continuing to watch the amount of fried and fatty foods consumed. Encouraged patient  to continue watching the amount of red meat that she consumes and to try to substitute for lean protein like chicken or fish. Encouraged patient to try and add more whole grains in diet. Gave examples of whole wheat bread and pasta, brown rice, oatmeal and whole grain cereals. Patient has been walking 3-4 times per week. Also spoke with patient about continuing to watch the amount of sweets and sugars consumed as well as carbs.    Navigation: This was the follow up session for this patient, I will check up on her progress in the coming months. Patient is scheduled for res-screening visit on April 04, 2021.  Time: 25 minutes

## 2021-01-11 ENCOUNTER — Other Ambulatory Visit: Payer: Self-pay

## 2021-01-11 ENCOUNTER — Ambulatory Visit: Payer: Self-pay | Admitting: Physician Assistant

## 2021-01-11 VITALS — BP 133/78 | HR 62 | Temp 98.2°F | Resp 18 | Wt 167.0 lb

## 2021-01-11 DIAGNOSIS — R7989 Other specified abnormal findings of blood chemistry: Secondary | ICD-10-CM | POA: Insufficient documentation

## 2021-01-11 DIAGNOSIS — M5442 Lumbago with sciatica, left side: Secondary | ICD-10-CM | POA: Insufficient documentation

## 2021-01-11 DIAGNOSIS — K219 Gastro-esophageal reflux disease without esophagitis: Secondary | ICD-10-CM

## 2021-01-11 DIAGNOSIS — E119 Type 2 diabetes mellitus without complications: Secondary | ICD-10-CM

## 2021-01-11 LAB — POCT GLYCOSYLATED HEMOGLOBIN (HGB A1C): Hemoglobin A1C: 6.5 % — AB (ref 4.0–5.6)

## 2021-01-11 LAB — GLUCOSE, POCT (MANUAL RESULT ENTRY): POC Glucose: 143 mg/dl — AB (ref 70–99)

## 2021-01-11 MED ORDER — OMEPRAZOLE 20 MG PO CPDR: 20.0000 mg | DELAYED_RELEASE_CAPSULE | Freq: Every day | ORAL | 3 refills | Status: DC

## 2021-01-11 MED ORDER — KETOROLAC TROMETHAMINE 60 MG/2ML IM SOLN
60.0000 mg | Freq: Once | INTRAMUSCULAR | Status: AC
  Administered 2021-01-11: 60 mg via INTRAMUSCULAR

## 2021-01-11 MED ORDER — METHYLPREDNISOLONE ACETATE 80 MG/ML IJ SUSP
80.0000 mg | Freq: Once | INTRAMUSCULAR | Status: AC
Start: 2021-01-11 — End: 2021-01-11
  Administered 2021-01-11: 80 mg via INTRAMUSCULAR

## 2021-01-11 NOTE — Progress Notes (Signed)
Established Patient Office Visit  Subjective:  Patient ID: Cheryl Patrick, female    DOB: [birth year]  Age: 50 y.o. MRN: [id number]  CC:  Chief Complaint  Patient presents with   Back Pain     HPI Cheryl Patrick reports that she has been having lower left back pain radiating down her left leg for the past 3 weeks.  Denies numbness or tingling.  Denies saddle anesthesia.  Denies injury or trauma.  States that she does sit at a desk all day.  States that she has used Tylenol without much relief.  Reports that she has been experiencing heartburn and acid reflux on a daily basis.  Reports it occurs approximately every time that she eats.  Reports that she has been taking Tums after almost every meal with some relief.  States that she has been working on lifestyle modifications, but does endorse that she will still have acid reflux despite food choices.  Reports that this has been present for "many years", but states in the last year it has been more frequent.  Due to language barrier, an interpreter was present during the history-taking and subsequent discussion (and for part of the physical exam) with this patient.   Past Medical History:  Diagnosis Date   Anxiety    Diabetes mellitus without complication (HCC)    Hyperlipemia     Past Surgical History:  Procedure Laterality Date   DILATION AND EVACUATION N/A 05/10/2017   Procedure: DILATATION AND EVACUATION;  Surgeon:  Bing, MD;  Location: [facility];  Service: Gynecology;  Laterality: N/A;   NO PAST SURGERIES      Family History  Problem Relation Age of Onset   Lymphoma Mother    Hypertension Mother    Diabetes Mother    Diabetes Father    Hypertension Father     Social History   Socioeconomic History   Marital status: Married    Spouse name: Not on file   Number of children: 3   Years of education: Not on file   Highest education level: 9th grade  Occupational History   Occupation: International aid/development worker:  [employer]  Tobacco Use   Smoking status: Never   Smokeless tobacco: Never  Vaping Use   Vaping Use: Never used  Substance and Sexual Activity   Alcohol use: No   Drug use: No   Sexual activity: Yes    Birth control/protection: Rhythm  Other Topics Concern   Not on file  Social History Narrative   Not on file   Social Determinants of Health   Financial Resource Strain: Not on file  Food Insecurity: Not on file  Transportation Needs: No Transportation Needs   Lack of Transportation (Medical): No   Lack of Transportation (Non-Medical): No  Physical Activity: Not on file  Stress: Not on file  Social Connections: Not on file  Intimate Partner Violence: Not on file    Outpatient Medications Prior to Visit  Medication Sig Dispense Refill   acetaminophen (TYLENOL) 500 MG tablet Take 500 mg by mouth every 6 (six) hours as needed for moderate pain.      atorvastatin (LIPITOR) 40 MG tablet Take 1 tablet (40 mg total) by mouth daily. 90 tablet 1   metFORMIN (GLUCOPHAGE) 500 MG tablet Take 1 tablet (500 mg total) by mouth 2 (two) times daily. 60 tablet 2   terbinafine (LAMISIL) 250 MG tablet Take 1 tablet (250 mg total) by mouth daily. 90 tablet 0  famotidine (PEPCID) 40 MG tablet Take 1 tablet (40 mg total) by mouth daily. 30 tablet 1   Misc Natural Products (T-RELIEF CBD+13 SL) Place 1,500 mg under the tongue in the morning. (Patient not taking: No sig reported)     atorvastatin (LIPITOR) 20 MG tablet TOME 1 PASTILLA POR VIA ORAL UNA VEZ AL DIA (Patient not taking: Reported on 01/10/2021) 90 tablet 3   No facility-administered medications prior to visit.    No Known Allergies  ROS Review of Systems  Constitutional:  Negative for chills and fever.  HENT: Negative.    Eyes: Negative.   Respiratory:  Negative for shortness of breath.   Cardiovascular:  Negative for chest pain.  Gastrointestinal:  Negative for abdominal pain, nausea and vomiting.  Endocrine: Negative.    Genitourinary: Negative.   Musculoskeletal:  Positive for back pain. Negative for joint swelling.  Skin: Negative.   Allergic/Immunologic: Negative.   Neurological: Negative.   Hematological: Negative.   Psychiatric/Behavioral: Negative.       Objective:    Physical Exam Vitals and nursing note reviewed.  Constitutional:      Appearance: Normal appearance.  HENT:     Head: Normocephalic and atraumatic.     Right Ear: External ear normal.     Left Ear: External ear normal.     Nose: Nose normal.     Mouth/Throat:     Mouth: Mucous membranes are moist.     Pharynx: Oropharynx is clear.  Eyes:     Extraocular Movements: Extraocular movements intact.     Conjunctiva/sclera: Conjunctivae normal.     Pupils: Pupils are equal, round, and reactive to light.  Cardiovascular:     Rate and Rhythm: Normal rate.     Pulses: Normal pulses.     Heart sounds: Normal heart sounds.  Pulmonary:     Effort: Pulmonary effort is normal.     Breath sounds: Normal breath sounds.  Musculoskeletal:     Cervical back: Normal, normal range of motion and neck supple.     Lumbar back: Tenderness present. No swelling. Normal range of motion.  Skin:    General: Skin is warm and dry.  Neurological:     General: No focal deficit present.     Mental Status: She is alert and oriented to person, place, and time.  Psychiatric:        Mood and Affect: Mood normal.        Behavior: Behavior normal.        Thought Content: Thought content normal.        Judgment: Judgment normal.    BP 133/78 (BP Location: Left Arm, Patient Position: Sitting, Cuff Size: Normal)   Pulse 62   Temp 98.2 F (36.8 C) (Oral)   Resp 18   Wt 167 lb (75.8 kg)   SpO2 97%   BMI 30.06 kg/m  Wt Readings from Last 3 Encounters:  01/11/21 167 lb (75.8 kg)  01/10/21 162 lb 14.4 oz (73.9 kg)  11/16/20 167 lb (75.8 kg)     Health Maintenance Due  Topic Date Due   PNEUMOCOCCAL POLYSACCHARIDE VACCINE AGE 33-64 HIGH RISK   Never done   COVID-19 Vaccine (1) Never done   FOOT EXAM  Never done   OPHTHALMOLOGY EXAM  Never done   URINE MICROALBUMIN  02/14/2021    There are no preventive care reminders to display for this patient.  Lab Results  Component Value Date   TSH 1.510 08/02/2020   Lab Results  Component Value Date   WBC 8.2 11/16/2020   HGB 12.8 11/16/2020   HCT 39.9 11/16/2020   MCV 84.9 11/16/2020   PLT 229 11/16/2020   Lab Results  Component Value Date   NA 138 11/16/2020   K 4.1 11/16/2020   CO2 23 11/16/2020   GLUCOSE 100 (H) 11/16/2020   BUN 17 11/16/2020   CREATININE 0.60 11/16/2020   BILITOT 0.5 11/16/2020   ALKPHOS 80 11/16/2020   AST 22 11/16/2020   ALT 19 11/16/2020   PROT 6.6 11/16/2020   ALBUMIN 3.7 11/16/2020   CALCIUM 8.9 11/16/2020   ANIONGAP 8 11/16/2020   Lab Results  Component Value Date   CHOL 172 08/02/2020   Lab Results  Component Value Date   HDL 43 08/02/2020   Lab Results  Component Value Date   LDLCALC 106 (H) 08/02/2020   Lab Results  Component Value Date   TRIG 131 08/02/2020   Lab Results  Component Value Date   CHOLHDL 4.0 08/02/2020   Lab Results  Component Value Date   HGBA1C 6.5 (A) 01/11/2021      Assessment & Plan:   Problem List Items Addressed This Visit       Digestive   Gastroesophageal reflux disease without esophagitis   Relevant Medications   omeprazole (PRILOSEC) 20 MG capsule     Endocrine   Type 2 diabetes mellitus without complication, without long-term current use of insulin (HCC)   Relevant Orders   HgB A1c (Completed)   Glucose (CBG) (Completed)   Microalbumin / creatinine urine ratio     Nervous and Auditory   Acute left-sided low back pain with left-sided sciatica - Primary     Other   Abnormal thyroid blood test   Relevant Orders   Thyroid Panel With TSH    Meds ordered this encounter  Medications   omeprazole (PRILOSEC) 20 MG capsule    Sig: Take 1 capsule (20 mg total) by mouth daily.     Dispense:  30 capsule    Refill:  3    Order Specific Question:   Supervising Provider    Answer:   Shan Levans E [[id number]]   ketorolac (TORADOL) injection 60 mg   methylPREDNISolone acetate (DEPO-MEDROL) injection 80 mg  1. Acute left-sided low back pain with left-sided sciatica Patient education given on gentle stretching, heat, rest.  Patient encouraged to use caution with ibuprofen, encouraged over-the-counter pain creams.  Red flags given for prompt reevaluation.   - ketorolac (TORADOL) injection 60 mg - methylPREDNISolone acetate (DEPO-MEDROL) injection 80 mg  2. Gastroesophageal reflux disease without esophagitis Trial Prilosec.  Patient encouraged to continue lifestyle modifications - omeprazole (PRILOSEC) 20 MG capsule; Take 1 capsule (20 mg total) by mouth daily.  Dispense: 30 capsule; Refill: 3  3. Type 2 diabetes mellitus without complication, without long-term current use of insulin (HCC) A1c 6.5.  Continue current regimen.  Patient has appointment in the middle of July 2022 with primary care provider for health maintenance. - HgB A1c - Glucose (CBG) - Microalbumin / creatinine urine ratio  4. Abnormal thyroid blood test   - Thyroid Panel With TSH   I have reviewed the patient's medical history (PMH, PSH, Social History, Family History, Medications, and allergies) , and have been updated if relevant. I spent 31 minutes reviewing chart and  face to face time with patient.    Follow-up: Return in about 26 days (around 02/06/2021) for [facility].    Kasandra Knudsen Mayers, PA-C

## 2021-01-11 NOTE — Patient Instructions (Signed)
I encourage you to start taking omeprazole 20 mg once a day to help with your heartburn and acid reflux.  To help with your back pain, I do encourage you to use heat, gentle stretching, and purchase pain cream over-the-counter.  You can also try ibuprofen over-the-counter, however be very mindful with this due to your heartburn and acid reflux, it can make that worse.  We will call you with today's lab results.  Please let us know if there is anything else we can do for you  Roney Jaffe, PA-C Physician Assistant [facility] [email or web address]   Rehabilitacin para la citica Sciatica Rehab Pregunte al mdico qu ejercicios son seguros para usted. Haga los ejercicios exactamente como se lo haya indicado el mdico y gradelos como se lo hayan indicado. Es normal sentir un estiramiento leve, tironeo, opresin o Dentist al [employer] ejercicios. Detngase de inmediato si siente un dolor repentino o Community education officer. No comience a hacer estos ejercicios hasta que se lo indique el mdico. Ejercicios de elongacin y amplitud de movimiento Estos ejercicios calientan los msculos y las articulaciones, y mejoran el movimiento y la flexibilidad de la cadera y la espalda. Estos ejerciciostambin ayudan a Engineer, materials, el adormecimiento y el hormigueo. Deslizamiento del nervio citico Sintese en una silla con la Carolyne Fiscal abajo en direccin al pecho. Coloque las manos detrs de la espalda. Deje caer los hombros hacia adelante. Extienda lentamente una de las piernas mientras inclina la cabeza hacia atrs como si estuviera mirando hacia el techo. Solo extienda la pierna tan lejos como pueda sin empeorar los sntomas. Mantenga esta posicin durante __________ segundos. Vuelva lentamente a la posicin inicial. Repita el ejercicio con la otra pierna. Repita __________ veces. Realice este ejercicio __________ veces al da. Rodilla al pecho  con aduccin de cadera y rotacin interna  Acustese boca arriba en una superficie firme con las piernas extendidas. Flexione una rodilla y llvela hacia el pecho hasta que sienta un estiramiento suave en la parte inferior de la espalda y las nalgas. A continuacin, mueva la rodilla hacia el hombro que est en el lado contrario de la pierna. Esto se denomina rotacin interna y aduccin de la cadera. Mantenga la pierna en esta posicin tomando la parte frontal de la rodilla. Mantenga esta posicin durante __________ segundos. Vuelva lentamente a la posicin inicial. Repita el ejercicio con la otra pierna. Repita __________ veces. Realice este ejercicio __________ veces al da. Extensin [employer] codos, en decbito prono  Acustese boca abajo sobre una superficie firme. La cama puede ser demasiado blanda para este ejercicio. [employer] codos. Con los brazos, aydese a Haematologist sentir un leve estiramiento en el abdomen y la parte inferior de la espalda. Esto Paramedic de [employer] codos. Si no se siente cmodo, intente colocando almohadas debajo del pecho. Debe dejar la cadera inmvil sobre la superficie en la que est apoyado. Mantenga la cadera y los msculos de la espalda relajados. Mantenga esta posicin durante __________ segundos. Afloje lentamente la parte superior del cuerpo y vuelva a la posicin inicial. Repita __________ veces. Realice este ejercicio __________ veces al da. Ejercicios de fortalecimiento Estos ejercicios fortalecen la espalda y le otorgan resistencia. La resistencia es la capacidad de usar los msculos durante un tiempo prolongado, inclusodespus de que se cansen. Inclinacin de la pelvis Este ejercicio fortalece los msculos que se encuentran en la parte profunda del abdomen. Acustese boca arriba sobre una superficie firme.  Doble las rodillas y [employer] pies planos sobre el piso. Tensione los msculos abdominales. Eleve  la pelvis hacia el techo y aplane la parte inferior de la espalda contra el suelo. Para realizar este ejercicio, puede colocar una toalla pequea debajo de la parte inferior de la espalda y presionar la espalda contra la toalla. Mantenga esta posicin durante __________ segundos. Relaje totalmente los msculos antes de repetir el ejercicio. Repita __________ veces. Realice este ejercicio __________ veces al da. Elevaciones alternadas de pierna y brazo  Apoye las palmas de las manos y las rodillas sobre una superficie firme. Si est sobre un suelo duro, puede usar un elemento acolchado, como una alfombrilla para ejercicios, para apoyar las rodillas. Alinee los brazos y las piernas. Las manos deben estar justo debajo de los hombros, y las rodillas debajo de la cadera. Eleve la pierna [employer]. Al mismo tiempo, eleve el brazo derecho y Associate Professor frente a usted. No eleve la pierna por encima de la cadera. No eleve el brazo por encima del hombro. Mantenga los msculos del abdomen y de la espalda contrados. Mantenga la cadera mirando hacia el suelo. No arquee la espalda. Mantenga el equilibrio con cuidado y no contenga la respiracin. Mantenga esta posicin durante __________ segundos. Vuelva lentamente a la posicin inicial. Repita con su pierna derecha y su brazo izquierdo. Repita __________ veces. Realice este ejercicio __________ veces al da. Postura y [place] corporal La buena postura y la Administrator, sports corporal saludable pueden ayudar a Acupuncturist estrs en las articulaciones y los tejidos del cuerpo. La Water quality scientist se refiere a los movimientos y a las posiciones del cuerpo mientras realiza las actividades diarias. La postura es una parte de la Water quality scientist. Neomia Dear buena postura significa que: La columna est en su posicin natural de curvatura en S (neutral). Los hombros estn [employer]. La cabeza no est inclinada hacia adelante. Siga esas pautas para  mejorar la postura y Regulatory affairs officer en susactividades diarias. De pie  Al estar de pie, mantenga la columna en la posicin neutral y los pies separados al ancho de caderas, aproximadamente. Mantenga las rodillas ligeramente flexionadas. Las [place], los hombros y las caderas deben estar alineados. Cuando realice una tarea en la que deba estar de pie en el mismo sitio durante mucho tiempo, coloque un pie en un objeto estable de 2 a 4 pulgadas (5 a 10 cm) de alto, como un taburete. Esto ayuda a que la columna mantenga una posicin neutral.  Sentado  Cuando est sentado, mantenga la columna en posicin neutral y deje los pies apoyados en el suelo. Use un apoyapis, si es necesario, y [employer] muslos paralelos al suelo. Evite redondear los hombros e inclinar la cabeza hacia adelante. Cuando trabaje en un escritorio o con una computadora, el escritorio debe estar a una altura en la que las manos estn un poco ms abajo que los codos. Deslice la silla debajo del escritorio, de modo de estar lo suficientemente cerca como para mantener una buena Pound. Cuando trabaje con una computadora, coloque el monitor a una altura que le permita mirar derecho hacia adelante, sin tener que inclinar la cabeza hacia adelante o [place] atrs.  Reposo Al descansar o estar acostado, evite las posiciones que le causen ms dolor. Si siente dolor al [employer] que exigen sentarse, inclinarse, agacharse o ponerse en cuclillas, acustese en una posicin en la que el cuerpo no deba doblarse mucho. Por ejemplo, evite acurrucarse de [employer] y  las rodillas cerca del pecho (posicin fetal). Si siente dolor con las actividades que exigen estar de pie durante mucho tiempo o Furniture conservator/restorer los brazos, acustese con la columna en una posicin neutral y flexione ligeramente las rodillas. Pruebe con las siguientes posiciones: Acostarse de costado con una almohada entre las rodillas. Acostarse boca arriba con una  almohada debajo de las rodillas. Levantar objetos  Cuando tenga que levantar un objeto, mantenga los pies separados el ancho de los hombros, como [place], y apriete los msculos abdominales. Flexione las rodillas y la cadera, y Dietitian la columna en posicin neutral. Es importante levantarse utilizando la fuerza de las piernas, no de la espalda. No trabe las rodillas hacia afuera. Siempre pida ayuda a otra persona para levantar objetos pesados o incmodos.  Esta informacin no tiene Theme park manager el consejo del mdico. Asegresede hacerle al mdico cualquier pregunta que tenga. Document Revised: 09/09/2018 Document Reviewed: 09/09/2018 Elsevier Patient Education  2022 [employer].

## 2021-01-11 NOTE — Progress Notes (Signed)
Patient has taken tylenol and other medications today. Patient reports back and leg pain for the past 3 weeks with increased pain during and after work. Patient reports tylenol provides minimal relief. Patient has not eaten today.

## 2021-01-12 LAB — MICROALBUMIN / CREATININE URINE RATIO
Creatinine, Urine: 47 mg/dL
Microalb/Creat Ratio: <6 mg/g creat (ref 0–29)
Microalbumin, Urine: <3 ug/mL

## 2021-01-12 LAB — THYROID PANEL WITH TSH
Free Thyroxine Index: 1.5 (ref 1.2–4.9)
T3 Uptake Ratio: 29 % (ref 24–39)
T4, Total: 5.1 ug/dL (ref 4.5–12.0)
TSH: 1.95 u[IU]/mL (ref 0.450–4.500)

## 2021-01-25 ENCOUNTER — Telehealth: Payer: Self-pay | Admitting: *Deleted

## 2021-01-25 NOTE — Telephone Encounter (Signed)
Patient verified DOB Patient is aware of labs being normal for thyroid.

## 2021-01-25 NOTE — Telephone Encounter (Signed)
-----   Message from Roney Jaffe, [place] sent at 01/15/2021 10:17 AM EDT ----- Please call patient and let her know that her thyroid function is within normal limits.

## 2021-02-05 ENCOUNTER — Encounter: Payer: Self-pay | Admitting: Nurse Practitioner

## 2021-02-06 ENCOUNTER — Encounter: Payer: Self-pay | Admitting: Nurse Practitioner

## 2021-02-06 ENCOUNTER — Ambulatory Visit: Payer: Self-pay | Attending: Nurse Practitioner | Admitting: Nurse Practitioner

## 2021-02-06 ENCOUNTER — Other Ambulatory Visit: Payer: Self-pay

## 2021-02-06 DIAGNOSIS — N951 Menopausal and female climacteric states: Secondary | ICD-10-CM

## 2021-02-06 DIAGNOSIS — G8929 Other chronic pain: Secondary | ICD-10-CM

## 2021-02-06 DIAGNOSIS — M5442 Lumbago with sciatica, left side: Secondary | ICD-10-CM

## 2021-02-06 MED ORDER — MELOXICAM 7.5 MG PO TABS: 7.5000 mg | ORAL_TABLET | Freq: Every day | ORAL | 1 refills | Status: DC

## 2021-02-06 MED ORDER — PAROXETINE HCL 10 MG PO TABS: 10.0000 mg | ORAL_TABLET | Freq: Every day | ORAL | 1 refills | Status: DC

## 2021-02-06 NOTE — Progress Notes (Signed)
Having pain in left leg. Pain radiates down to foot. Saw Cari on mobile unit. Anxious and hot flashes Needs medication refills.

## 2021-02-06 NOTE — Progress Notes (Signed)
Virtual Visit via Telephone Note Due to national recommendations of social distancing due to COVID 19, telehealth visit is felt to be most appropriate for this patient at this time.  I discussed the limitations, risks, security and privacy concerns of performing an evaluation and management service by telephone and the availability of in person appointments. I also discussed with the patient that there may be a patient responsible charge related to this service. The patient expressed understanding and agreed to proceed.    I connected with Cheryl Patrick on 02/06/21  at   9:50 AM EDT  EDT by telephone and verified that I am speaking with the correct person using two identifiers.  Location of Patient: Private Residence   [facility] of Provider: [facility]    Persons participating in Telemedicine visit: Bertram Denver FNP-BC Melora Menon  Spanish interpreter [name] [phone]   History of Present Illness: Telemedicine visit for: Follow up  She has a past medical history of Anxiety, Diabetes mellitus without complication (HCC), and Hyperlipemia.   Back Pain: Patient presents for presents evaluation of low back problems.  Symptoms have been present for 3 months and include  pain radiating from the lower back into the left hip and left leg . Initial inciting event: none. Symptoms are worst: all day. Alleviating factors identifiable by patient are none. Exacerbating factors identifiable by patient are sitting, standing, and walking. Treatments so far initiated by patient:  OTC Tylenole  Previous lower back problems: none. Previous workup: none. Previous treatments:  toradol and prednisone . Evaluated 01-11-2021 for complaints of low back pain today she states the injection of toradol and prednisolone were ineffective. She denies any symptoms of involuntary loss of urine or stool   Menopausal symptoms Endorses hot flashes and "my head feels confused". Associated symptoms  headaches, nausea and dizziness.     Past Medical History:  Diagnosis Date   Anxiety    Diabetes mellitus without complication (HCC)    Hyperlipemia     Past Surgical History:  Procedure Laterality Date   DILATION AND EVACUATION N/A 05/10/2017   Procedure: DILATATION AND EVACUATION;  Surgeon: Manistique Bing, MD;  Location: [facility];  Service: Gynecology;  Laterality: N/A;   NO PAST SURGERIES      Family History  Problem Relation Age of Onset   Lymphoma Mother    Hypertension Mother    Diabetes Mother    Diabetes Father    Hypertension Father     Social History   Socioeconomic History   Marital status: Married    Spouse name: Not on file   Number of children: 3   Years of education: Not on file   Highest education level: 9th grade  Occupational History   Occupation: International aid/development worker: [employer]  Tobacco Use   Smoking status: Never   Smokeless tobacco: Never  Vaping Use   Vaping Use: Never used  Substance and Sexual Activity   Alcohol use: No   Drug use: No   Sexual activity: Yes    Birth control/protection: Rhythm  Other Topics Concern   Not on file  Social History Narrative   Not on file   Social Determinants of Health   Financial Resource Strain: Not on file  Food Insecurity: Not on file  Transportation Needs: No Transportation Needs   Lack of Transportation (Medical): No   Lack of Transportation (Non-Medical): No  Physical Activity: Not on file  Stress: Not on file  Social Connections:  Not on file     Observations/Objective: Awake, alert and oriented x 3   Review of Systems  Constitutional:  Negative for fever, malaise/fatigue and weight loss.  HENT: Negative.  Negative for nosebleeds.   Eyes: Negative.  Negative for blurred vision, double vision and photophobia.  Respiratory: Negative.  Negative for cough and shortness of breath.   Cardiovascular: Negative.  Negative for chest pain, palpitations and leg swelling.  Gastrointestinal:   Positive for nausea. Negative for heartburn and vomiting.  Musculoskeletal:  Positive for back pain and joint pain. Negative for myalgias.  Neurological:  Positive for dizziness, sensory change and headaches. Negative for focal weakness and seizures.  Psychiatric/Behavioral: Negative.  Negative for suicidal ideas.    Assessment and Plan: Cheryl Patrick was seen today for leg pain.  Diagnoses and all orders for this visit:  Chronic left-sided low back pain with left-sided sciatica -     DG Lumbar Spine Complete; Future -     meloxicam (MOBIC) 7.5 MG tablet; Take 1 tablet (7.5 mg total) by mouth daily.  Symptoms, such as flushing, sleeplessness, headache, lack of concentration, associated with the menopause -     PARoxetine (PAXIL) 10 MG tablet; Take 1 tablet (10 mg total) by mouth at bedtime.    Follow Up Instructions Return in about 4 weeks (around 03/06/2021) for TELE 810 or 130 for back pain and hot flashes. .     I discussed the assessment and treatment plan with the patient. The patient was provided an opportunity to ask questions and all were answered. The patient agreed with the plan and demonstrated an understanding of the instructions.   The patient was advised to call back or seek an in-person evaluation if the symptoms worsen or if the condition fails to improve as anticipated.  I provided 16 minutes of non-face-to-face time during this encounter including median intraservice time, reviewing previous notes, labs, imaging, medications and explaining diagnosis and management.  Claiborne Rigg, FNP-BC

## 2021-02-14 ENCOUNTER — Ambulatory Visit: Payer: No Typology Code available for payment source

## 2021-02-19 ENCOUNTER — Ambulatory Visit: Payer: Self-pay

## 2021-02-19 ENCOUNTER — Ambulatory Visit: Payer: No Typology Code available for payment source

## 2021-02-19 NOTE — Telephone Encounter (Signed)
Answer Assessment - Initial Assessment Questions 1. ONSET: "When did the throat start hurting?" (Hours or days ago)      Last week 2. SEVERITY: "How bad is the sore throat?" (Scale 1-10; mild, moderate or severe)   - MILD (1-3):  doesn't interfere with eating or normal activities   - MODERATE (4-7): interferes with eating some solids and normal activities   - SEVERE (8-10):  excruciating pain, interferes with most normal activities   - SEVERE DYSPHAGIA: can't swallow liquids, drooling     8 3. STREP EXPOSURE: "Has there been any exposure to strep within the past week?" If Yes, ask: "What type of contact occurred?"      No 4.  VIRAL SYMPTOMS: "Are there any symptoms of a cold, such as a runny nose, cough, hoarse voice or red eyes?"      Has poison ivy, runny nose 5. FEVER: "Do you have a fever?" If Yes, ask: "What is your temperature, how was it measured, and when did it start?"     No 6. PUS ON THE TONSILS: "Is there pus on the tonsils in the back of your throat?"     No 7. OTHER SYMPTOMS: "Do you have any other symptoms?" (e.g., difficulty breathing, headache, rash)     Poison ivy to face 8. PREGNANCY: "Is there any chance you are pregnant?" "When was your last menstrual period?"     No  Protocols used: Sore Throat-A-AH

## 2021-02-19 NOTE — Telephone Encounter (Signed)
Using Spanish interpreter Ileen # [phone], pt. Reports she has poison ivy on her face since last week. Also has a runny nose and sore throat. Does not know if it is related. Has not had a COVID 19 test. Request appointment today. Please advise.

## 2021-02-20 ENCOUNTER — Telehealth: Payer: Self-pay | Admitting: Nurse Practitioner

## 2021-02-20 NOTE — Telephone Encounter (Signed)
Patient called requesting med for itchy rash on face since last week , was exposed to poison ivey. Please call back

## 2021-02-20 NOTE — Telephone Encounter (Signed)
Patient states she is covid positive. Tested positive on yesterday. Denies sore throat.  She has concerns about the poison ivy rash on face and neck. Would like Rx for rash and  Covid sx Will transfer call to set up appt with mobile unit operations.

## 2021-02-21 NOTE — Telephone Encounter (Signed)
Pt is needing medication for poison ivy rash.

## 2021-02-22 ENCOUNTER — Other Ambulatory Visit: Payer: Self-pay

## 2021-02-22 ENCOUNTER — Other Ambulatory Visit: Payer: Self-pay | Admitting: Nurse Practitioner

## 2021-02-22 ENCOUNTER — Telehealth (INDEPENDENT_AMBULATORY_CARE_PROVIDER_SITE_OTHER): Payer: Self-pay | Admitting: Physician Assistant

## 2021-02-22 DIAGNOSIS — U071 COVID-19: Secondary | ICD-10-CM

## 2021-02-22 DIAGNOSIS — L309 Dermatitis, unspecified: Secondary | ICD-10-CM

## 2021-02-22 MED ORDER — PREDNISONE 10 MG PO TABS: 10.0000 mg | ORAL_TABLET | Freq: Every day | ORAL | 0 refills | Status: AC

## 2021-02-22 MED ORDER — TRIAMCINOLONE ACETONIDE 0.5 % EX OINT: 1.0000 "application " | TOPICAL_OINTMENT | Freq: Two times a day (BID) | CUTANEOUS | 1 refills | Status: DC

## 2021-02-22 NOTE — Progress Notes (Signed)
Medical Assistant used [employer] to contact patient.  Interpreter Name: Lurlean Leyden Interpreter #: [id number] Patient reports symptoms beginning last week on Wednesday. Patient has not taken medication or eating today. Patient had low grade fever at 99.0 and congestion. Patient is not able to taste or smell at this time. Patient denies HA, N/V Diarrhea. Patient last episode of diarrhea was Tuesday. Patient complains of dry itchy cough. Patient reports taking prednisone for 8 days with no relief.

## 2021-02-22 NOTE — Progress Notes (Signed)
Established Patient Office Visit  Subjective:  Patient ID: Cheryl Patrick, female    DOB: 1971-03-05  Age: 50 y.o. MRN: [id number]  CC:  Chief Complaint  Patient presents with   Covid Positive    Virtual Visit via Telephone Note  I connected with [name] on 02/22/21 at 11:10 AM EDT by telephone and verified that I am speaking with the correct person using two identifiers.  Location: Patient: home Provider: Primary Care at [facility]   I discussed the limitations, risks, security and privacy concerns of performing an evaluation and management service by telephone and the availability of in person appointments. I also discussed with the patient that there may be a patient responsible charge related to this service. The patient expressed understanding and agreed to proceed.   History of Present Illness: Presents with 2 complaints.  Reports that she was exposed to poison ivy 8 days ago, states that she has a rash on both her arms and both of her feet.  Reports that she was prescribed prednisone by her primary care provider, states that she still has 5 days left on her taper.  Reports that it has improved some but is still present.  Reports that rash is very itchy.  Reports that she started having a dry cough, nasal congestion, fever, headache 6 days ago.  Reports that she took a home COVID test on Monday, which was positive.  Reports that most of her symptoms have resolved, but is still having a dry cough clear nasal congestion, has lost ability to taste or smell last measured temperature was 99.  Reports that she has been using over-the-counter cold medication with relief.  Is eating and drinking okay.    Observations/Objective:  Medical history and current medications reviewed, no physical exam completed   Past Medical History:  Diagnosis Date   Anxiety    Diabetes mellitus without complication (HCC)    Hyperlipemia     Past Surgical History:  Procedure Laterality Date    DILATION AND EVACUATION N/A 05/10/2017   Procedure: DILATATION AND EVACUATION;  Surgeon: Norfolk Bing, MD;  Location: [facility];  Service: Gynecology;  Laterality: N/A;   NO PAST SURGERIES      Family History  Problem Relation Age of Onset   Lymphoma Mother    Hypertension Mother    Diabetes Mother    Diabetes Father    Hypertension Father     Social History   Socioeconomic History   Marital status: Married    Spouse name: Not on file   Number of children: 3   Years of education: Not on file   Highest education level: 9th grade  Occupational History   Occupation: International aid/development worker: [employer]  Tobacco Use   Smoking status: Never   Smokeless tobacco: Never  Vaping Use   Vaping Use: Never used  Substance and Sexual Activity   Alcohol use: No   Drug use: No   Sexual activity: Yes    Birth control/protection: Rhythm  Other Topics Concern   Not on file  Social History Narrative   Not on file   Social Determinants of Health   Financial Resource Strain: Not on file  Food Insecurity: Not on file  Transportation Needs: No Transportation Needs   Lack of Transportation (Medical): No   Lack of Transportation (Non-Medical): No  Physical Activity: Not on file  Stress: Not on file  Social Connections: Not on file  Intimate Partner Violence: Not on file  Outpatient Medications Prior to Visit  Medication Sig Dispense Refill   acetaminophen (TYLENOL) 500 MG tablet Take 500 mg by mouth every 6 (six) hours as needed for moderate pain.      atorvastatin (LIPITOR) 40 MG tablet Take 1 tablet (40 mg total) by mouth daily. 90 tablet 1   meloxicam (MOBIC) 7.5 MG tablet Take 1 tablet (7.5 mg total) by mouth daily. 30 tablet 1   metFORMIN (GLUCOPHAGE) 500 MG tablet Take 1 tablet (500 mg total) by mouth 2 (two) times daily. 60 tablet 2   omeprazole (PRILOSEC) 20 MG capsule Take 1 capsule (20 mg total) by mouth daily. 30 capsule 3   PARoxetine (PAXIL) 10 MG tablet Take 1  tablet (10 mg total) by mouth at bedtime. 90 tablet 1   terbinafine (LAMISIL) 250 MG tablet Take 1 tablet (250 mg total) by mouth daily. 90 tablet 0   predniSONE (DELTASONE) 10 MG tablet Take 1 tablet (10 mg total) by mouth daily with breakfast for 5 days. 5 tablet 0   No facility-administered medications prior to visit.    No Known Allergies  ROS Review of Systems  Constitutional:  Negative for chills and fever.  HENT:  Positive for congestion, rhinorrhea and sinus pressure. Negative for sore throat and trouble swallowing.   Eyes: Negative.   Respiratory:  Positive for cough. Negative for shortness of breath.   Cardiovascular:  Negative for chest pain.  Gastrointestinal:  Negative for nausea and vomiting.  Endocrine: Negative.   Genitourinary: Negative.   Musculoskeletal:  Negative for myalgias.  Skin: Negative.   Allergic/Immunologic: Negative.   Neurological:  Negative for headaches.  Hematological: Negative.   Psychiatric/Behavioral: Negative.       Objective:      There were no vitals taken for this visit. Wt Readings from Last 3 Encounters:  01/11/21 167 lb (75.8 kg)  01/10/21 162 lb 14.4 oz (73.9 kg)  11/16/20 167 lb (75.8 kg)     Health Maintenance Due  Topic Date Due   PNEUMOCOCCAL POLYSACCHARIDE VACCINE AGE 61-64 HIGH RISK  Never done   COVID-19 Vaccine (1) Never done   FOOT EXAM  Never done   OPHTHALMOLOGY EXAM  Never done    There are no preventive care reminders to display for this patient.  Lab Results  Component Value Date   TSH 1.950 01/11/2021   Lab Results  Component Value Date   WBC 8.2 11/16/2020   HGB 12.8 11/16/2020   HCT 39.9 11/16/2020   MCV 84.9 11/16/2020   PLT 229 11/16/2020   Lab Results  Component Value Date   NA 138 11/16/2020   K 4.1 11/16/2020   CO2 23 11/16/2020   GLUCOSE 100 (H) 11/16/2020   BUN 17 11/16/2020   CREATININE 0.60 11/16/2020   BILITOT 0.5 11/16/2020   ALKPHOS 80 11/16/2020   AST 22 11/16/2020   ALT  19 11/16/2020   PROT 6.6 11/16/2020   ALBUMIN 3.7 11/16/2020   CALCIUM 8.9 11/16/2020   ANIONGAP 8 11/16/2020   Lab Results  Component Value Date   CHOL 172 08/02/2020   Lab Results  Component Value Date   HDL 43 08/02/2020   Lab Results  Component Value Date   LDLCALC 106 (H) 08/02/2020   Lab Results  Component Value Date   TRIG 131 08/02/2020   Lab Results  Component Value Date   CHOLHDL 4.0 08/02/2020   Lab Results  Component Value Date   HGBA1C 6.5 (A) 01/11/2021  Assessment & Plan:   Problem List Items Addressed This Visit       Other   COVID-19 virus infection   Other Visit Diagnoses     Dermatitis    -  Primary   Relevant Medications   triamcinolone ointment (KENALOG) 0.5 %       Meds ordered this encounter  Medications   triamcinolone ointment (KENALOG) 0.5 %    Sig: Apply 1 application topically 2 (two) times daily.    Dispense:  30 g    Refill:  1    Order Specific Question:   Supervising Provider    Answer:   Storm Frisk [[id number]]   Assessment and Plan: 1. Dermatitis Continue steroid taper, triamcinolone trial.  Patient education given on supportive care. - triamcinolone ointment (KENALOG) 0.5 %; Apply 1 application topically 2 (two) times daily.  Dispense: 30 g; Refill: 1  2. COVID-19 virus infection Patient education given on supportive care, proper isolation, masking length.  Red flags given for prompt reevaluation.   Follow Up Instructions:    I discussed the assessment and treatment plan with the patient. The patient was provided an opportunity to ask questions and all were answered. The patient agreed with the plan and demonstrated an understanding of the instructions.   The patient was advised to call back or seek an in-person evaluation if the symptoms worsen or if the condition fails to improve as anticipated.  I provided 21 minutes of non-face-to-face time during this encounter.    Follow-up: Return if symptoms  worsen or fail to improve.    Kasandra Knudsen Mayers, PA-C

## 2021-02-23 ENCOUNTER — Encounter: Payer: Self-pay | Admitting: Physician Assistant

## 2021-02-23 NOTE — Patient Instructions (Signed)
Dermatitis por hiedra venenosa Poison Ivy Dermatitis La dermatitis por hiedra venenosa es la inflamacin de la piel que se produce a causa de sustancias qumicas de las hojas de dicha planta. La reaccin de lapiel a menudo incluye enrojecimiento, hinchazn, ampollas y Financial risk analystuna gran picazn. Cules son las causas? Esta afeccin se produce por una sustancia qumica (urushiol) que se encuentra en la savia de la hiedra venenosa. La sustancia qumica es pegajosa y puede transmitirse fcilmente a personas, animales y Katyobjetos. Puede contraer dermatitis por hiedra venenosa en cualquiera de estos casos: Tiene contacto directo con una planta de hiedra venenosa. Toca animales, personas u objetos que han estado en contacto con la hiedra venenosa y en los que ha quedado la sustancia qumica. Qu incrementa el riesgo? Es ms probable que Dietitianesta afeccin se manifieste en las personas que presentan alguna de estas caractersticas: Estn al Guadalupe Dawnaire libre a menudo en zonas boscosas o pantanosas. Estn al OGE Energyaire libre sin ropa de proteccin, como calzado cerrado, pantalones largos y camisa de Data processing managermangas largas. Cules son los signos o los sntomas? Los sntomas de esta afeccin incluyen: Piel enrojecida. Una gran picazn. Una erupcin cutnea con protuberancias y ampollas. Por lo general, la erupcin cutnea aparece 48 horas despus de la exposicin, si ha estado expuesto antes. Si es la primera vez que ha estado expuesto, es posible que la erupcin cutnea no aparezca hasta una semana despus de la exposicin. Hinchazn. Puede ocurrir si la reaccin es ms grave. Por lo general, los sntomas duran de 1 a 2 semanas. Sin embargo, la primeravez que la afeccin aparece, los sntomas pueden durar entre 3 y 4 semanas. Cmo se diagnostica? Esta afeccin se puede diagnosticar en funcin de los sntomas y de un examen fsico. El mdico tambin puede hacerle preguntas sobre cualquier actividadreciente al Escobaresaire libre. Cmo se trata? El  tratamiento de esta afeccin variar en funcin de la gravedad. El tratamiento puede incluir: Cremas con hidrocortisona o lociones con calamina para Associate Professoraliviar la picazn. Baos de avena para calmar la piel. Medicamentos como comprimidos antihistamnicos de Sales promotion account executiveventa libre. Corticoesteroides por va oral para tratar reacciones ms graves. Siga estas instrucciones en su casa: Medicamentos Tome o aplquese los medicamentos de venta libre y los recetados solamente como se lo haya indicado el mdico. Utilice una crema con hidrocortisona o una locin con calamina para calmar la piel y Technical sales engineeraliviar la picazn, tanto y como sea necesario. Instrucciones generales No se rasque ni se frote la piel. Aplique un pao fro y hmedo (compresa fra) en las zonas afectadas o tome baos de agua fra. Esto lo ayudar a Associate Professoraliviar la picazn. Evite baos y duchas calientes. Tome baos de avena segn sea necesario. Use avena coloidal. Puede conseguirla en la tienda de comestibles o la farmacia local. Siga las instrucciones del envase. Mientras tenga erupcin cutnea, lave las prendas que use inmediatamente despus de sacrselas. Concurra a todas las visitas de [address]seguimiento como se lo haya indicado el mdico. Esto es importante. Cmo se evita?  Aprenda a identificar la hiedra venenosa y evite el contacto con la planta. Esta planta puede reconocerse por la cantidad de hojas. En general, la hiedra venenosa tiene tres hojas, con ramas que florecen de un solo tallo. Las hojas suelen ser brillantes y tienen bordes irregulares que terminan en una punta en el frente. Si ha estado expuesto a la hiedra venenosa, lvese muy bien con agua y Bel[place]jabn de inmediato. Tiene alrededor de 30 minutos para eliminar la resina de la planta antes de Wal-Martque le  cause una erupcin cutnea. Asegrese de lavarse debajo de las uas de las [place], porque todo resto de resina seguir diseminando la erupcin cutnea. Al hacer excursiones o ir de campamento, use ropa que  lo ayude a evitar la exposicin de la piel. Esto incluye pantalones largos, camisa de [employer], medias altas y botas para caminar. Tambin puede aplicarse una locin preventiva en la piel, que lo ayudar a limitar la exposicin. Si sospecha que su ropa o el equipo especfico para el aire libre entraron en contacto con una hiedra venenosa, enjuguelos con una manguera de jardn antes de llevarlos a su casa. Cuando trabaje en el jardn o haga jardinera, use guantes, mangas largas, pantalones largos y botas. Lave las herramientas de jardn y los guantes si estn en contacto con la hiedra venenosa. Si sospecha que su mascota ha entrado en contacto con la hiedra venenosa, lave al animal con champ para mascotas y [place]. Use guantes mientras lave a la mascota. Comunquese con un mdico si tiene: lceras abiertas en la zona de la erupcin cutnea. Ms enrojecimiento, hinchazn o dolor en la zona afectada. Enrojecimiento que se extiende ms all de la zona de la erupcin cutnea. Lquido, sangre o pus que emana de la zona afectada. Grant Ruts. Una erupcin cutnea en una zona extensa del cuerpo. Una erupcin cutnea en los ojos, la boca o los genitales. Una erupcin cutnea que no mejora despus de unas semanas. Solicite ayuda inmediatamente si: Se le hincha la cara o se le hinchan los prpados al punto de cerrarse. Tiene dificultad para respirar. Tiene dificultad para tragar. Estos sntomas pueden representar un problema grave que constituye Radio broadcast assistant. No espere a ver si los sntomas desaparecen. Solicite atencin mdica de inmediato. Comunquese con el servicio de emergencias de su localidad (911 en los [place]). No conduzca por sus propios medios [facility] hospital. Resumen La dermatitis por hiedra venenosa es la inflamacin de la piel que se produce a causa de sustancias qumicas de las hojas de dicha planta. Los sntomas de esta afeccin incluyen enrojecimiento, picazn, erupcin cutnea  e hinchazn. No se rasque ni se frote la piel. Tome o aplquese los medicamentos de venta libre y los recetados solamente como se lo haya indicado el mdico. Esta informacin no tiene Theme park manager el consejo del mdico. Asegresede hacerle al mdico cualquier pregunta que tenga. Document Revised: 08/07/2018 Document Reviewed: 08/07/2018 Elsevier Patient Education  2022 Elsevier Inc.  COVID-19: Donaciano Eva y aislamiento COVID-19: Quarantine and Proofreader cuarentena Si estuvo expuesto Deep[place] cuarentena y [place] alejado de otras personas cuando haya estado en contacto estrechocon alguien que tiene COVID-19. Aslese Si est enfermo o el resultado de la prueba es positivo Aslese cuando est enfermo o cuando tenga COVID-19, aunque no tenga sntomas. Cundo quedarse en casa Cmo calcular la cuarentena La fecha de la exposicin se considera el da 0. El da 1 es Film/video editor completo despus de su ltimo contacto con una persona que tuvo COVID-19. Lanny Hurst en casa y [place] de otras personas durante al menos 5 das. Descubra por [employer] for Disease Control and Prevention Insurance claims handler) (Centros para Air traffic controller y la Prevencin de Event organiser) han actualizado las pautas para el pblico general. SI USTED se expuso al COVID-19 y NO est al da SI USTED se expuso al COVID-19 y NO est con las vacunas contra el COVID-19 Haga cuarentena durante al menos 5 [place] en su Kathlene Cote en su casa y haga cuarentena durante al menos 5 [place]  completos. Use una mascarilla bien ajustada si debe estar cerca de otras personas en su casa. No viaje. Somtase a pruebas de Public relations account executive no presente sntomas, hgase la prueba al menos 5 das despus de haber tenido el ltimo contacto estrecho con una persona con COVID-19. Despus de la cuarentena Est atento a los sntomas Est atento a los sntomas hasta 10 das despus de la ltima vez que haya tenido contacto estrecho con una persona con  COVID-19. Evite viajar Es Ambulance person 10 das despus de la ltima vez que tuvo contacto estrecho con una persona con COVID-19. Si presenta sntomas Aslese de inmediato y hgase la prueba. Contine quedndose en su casa hasta que [employer]. Use una mascarilla bien ajustada cuando est con [employer]. Tome precauciones [employer] da 10 Use mascarilla Use una mascarilla bien ajustada durante 10 das completos en todo momento en que est cerca de otras personas dentro de su casa o en pblico. No vaya a lugares donde no pueda Diplomatic Services operational officer. Si debe viajar [employer] 6 y 10, tome precauciones. Evite estar cerca de personas de alto riesgo SI USTED se expuso al COVID-19 y est al da SI USTED se expuso al COVID-19 y est con las vacunas contra el COVID-19 No haga cuarentena No es necesario que se quede en su casa, a menos que presente sntomas. Somtase a pruebas de Public relations account executive no presente sntomas, hgase la prueba al menos 5 das despus de haber tenido el ltimo contacto estrecho con una persona con COVID-19. Est atento a los sntomas Est atento a los sntomas hasta 10 das despus de la ltima vez que haya tenido contacto estrecho con una persona con COVID-19. Si presenta sntomas Aslese de inmediato y hgase la prueba. Contine quedndose en su casa hasta que [employer]. Use una mascarilla bien ajustada cuando est con [employer]. Tome precauciones [employer] da 10 Use mascarilla Use una mascarilla bien ajustada durante 10 das completos en todo momento en que est cerca de otras personas dentro de su casa o en pblico. No vaya a lugares donde no pueda Diplomatic Services operational officer. Tome precauciones si viaja Evite estar cerca de personas de alto riesgo SI USTED se expuso al COVID-19 y tuvo COVID-19 confirmado en los ltimos [address] (tuvo un resultado positivo en la prueba viral) No haga cuarentena No es necesario que se quede en su casa, a  menos que presente sntomas. Est atento a los sntomas Est atento a los sntomas hasta 10 das despus de la ltima vez que haya tenido contacto estrecho con una persona con COVID-19. Si presenta sntomas Aslese de inmediato y hgase la prueba. Contine quedndose en su casa hasta que [employer]. Use una mascarilla bien ajustada cuando est con [employer]. Tome precauciones [employer] da 10 Use mascarilla Use una mascarilla bien ajustada durante 10 das completos en todo momento en que est cerca de otras personas dentro de su casa o en pblico. No vaya a lugares donde no pueda Diplomatic Services operational officer. Tome precauciones si viaja Evite estar cerca de personas de alto riesgo Cmo calcular el aislamiento El da 0 es Film/video editor en que tiene sntomas u obtiene un resultado positivo en la prueba viral. El da 1 es Film/video editor completo despus de que aparecen los sntomas o de que le toman la muestra para la prueba. Si tiene COVID-19 o tiene sntomas, aslese durante al menos 5 [address]  [address] SI USTED tuvo resultado positivo de COVID-19 o  tiene sntomas, independientemente de su estado respecto de la vacunacin Google en su casa durante al menos 5 [address] en su casa durante 5 das y aslese de las dems personas que vivan en su casa. Use una mascarilla bien ajustada si debe estar cerca de otras personas en su casa. No viaje. Finalizacin del aislamiento si tuvo sntomas Finalice el aislamiento despus de 5 das completos si no tiene fiebre durante 24 horas (sin usar medicamentos para Oncologist) y sus sntomas estn mejorando. Finalizacin del aislamiento si NO tuvo sntomas Finalice el aislamiento cuando hayan pasado al menos 5 das completos despus del resultado positivo en la prueba. Si estuvo gravemente enfermo con COVID-19 o est inmunodeprimido Debe aislarse durante al [employer] [address] Consulte a su mdico antes de Midwife. Tome precauciones [employer] da 10 Use  mascarilla Use una mascarilla bien ajustada durante 10 das completos en todo momento en que est cerca de otras personas dentro de su casa o en pblico. No vaya a lugares donde no pueda Diplomatic Services operational officer. No viaje No viaje hasta 10 das despus del inicio de sus sntomas o hasta la fecha en que se le realiz la prueba que dio resultado positivo si no tuvo sntomas. Evite estar cerca de personas de alto riesgo Definiciones Exposicin Contacto con una persona infectada por el SARS-CoV-2, el virus que causa elCOVID-19, de [place] tal que aumenta la probabilidad de infectarse por el virus. Contacto estrecho Se considera contacto estrecho a la persona que se haya encontrado a menos de 6 pies de distancia de una persona infectada (confirmada por laboratorio o con diagnstico clnico) durante un total acumulado de 15 minutos o ms durante un perodo de 24 horas. Por ejemplo, tres exposiciones individuales de 5 minutos conforman un total de 15 minutos. Las personas que estn expuestas a alguien con COVID-19 despus de haber completado al menos 5 das de aislamiento no seconsideran contactos estrechos. Haga cuarentena La cuarentena es una estrategia que se utiliza para prevenir la transmisin del COVID-19 al [employer] separadas de los dems a las personas que han estado en contacto estrecho con alguien con COVID-19. Quin no necesita hacer cuarentena? Si tuvo contacto estrecho con una persona con COVID-19 y est en uno de los siguientes grupos, no necesita Sales promotion account executive. Est al da con las vacunas contra el COVID-19. Tuvo COVID-19 confirmado en los ltimos [address] (lo que significa que obtuvo un resultado positivo en una prueba viral). Debe usar Primary school teacher bien ajustada cuando est con otras personas durante 10 das a partir de la fecha de ltimo contacto estrecho con una persona con COVID-19 (la fecha del ltimo contacto estrecho se considera el da 0). Hgase la prueba al menos 5 das despus de haber  tenido el ltimo contacto estrecho con una persona con COVID-19. Si da positivo o presenta sntomas de COVID-19, aslese de las dems personas y siga las recomendaciones de la seccin Aislamiento a continuacin. Si obtuvo un resultado positivo de COVID-19 en una prueba viral en los ltimos 90 das y posteriormente se recuper y Chief Executive Officer sin sntomas de COVID-19, no es necesario que haga cuarentena ni que se haga la prueba despus de un contacto estrecho. Debe usar [employer] bien ajustada cuando est con otras personas durante 10 das a partir de la fecha de ltimo contacto estrecho con una persona con COVID-19 (la fecha del ltimo contactoestrecho se considera el da 0). Quines deben hacer cuarentena? Si est en contacto estrecho con una persona con COVID-19, debe  hacer cuarentena si no est al da con las vacunas contra el COVID-19. Esto incluye a las personas que no estnvacunadas. Qu hacer durante la cuarentena [place] en su casa y mantngase alejado de las dems personas durante al menos 5 das (del da 0 al da 5) despus del ltimo contacto con Neomia Dear persona con COVID-19. La fecha de la exposicin se considera el da 0. Use una mascarilla bien ajustada cuando est cerca de otras personas en su casa, si es posible. Durante 10 das despus de su ltimo contacto estrecho con una persona con COVID-19, est atento a si presenta fiebre (100.4 F o ms), tos, dificultad para respirar u otros sntomas de COVID-19. Si presenta sntomas, hgase la prueba inmediatamente y aslese hasta que reciba el resultado. Si el resultado es positivo, siga las recomendaciones de [place]. Si no presenta sntomas, hgase la prueba al menos 5 das despus de haber tenido el ltimo contacto estrecho con una persona con COVID-19. Si el resultado de la prueba es negativo, puede salir de Pensions consultant, [place] siga usando una mascarilla bien ajustada cuando est cerca de otras personas en su casa y en pblico hasta 10 das  despus de su ltimo contacto estrecho con una persona con COVID-19. Si el resultado es positivo, debe aislarse durante al menos 5 das a partir de la fecha de la prueba positiva (si no tiene sntomas). Si presenta sntomas de COVID-19, aslese durante al menos 5 das desde la fecha en que comenzaron los sntomas (la fecha en que comenzaron los sntomas es el da 0). Siga las recomendaciones de la seccin Aislamiento a continuacin. Si no puede hacerse una prueba 5 das despus del ltimo contacto estrecho con una persona con COVID-19, puede salir de su casa despus del da 5 si no ha tenido sntomas de COVID-19 en todo el perodo de [address] Use una mascarilla bien ajustada durante 10 das despus de la fecha del ltimo contacto estrecho cuando est con otras personas en su casa y en pblico. Evite a las personas inmunodeprimidas o con alto riesgo de Product/process development scientist, como tambin las residencias de Pension scheme manager y otros entornos de alto riesgo, Teacher, adult education que hayan pasado al menos [address]. Si es posible, aljese de las personas con las que vive, Haematologist de las personas que tienen un mayor riesgo de Counsellor gravemente por COVID-19, as como de otras personas fuera de su casa, durante los 10 das completos posteriores a su ltimo contacto estrecho con una persona con COVID-19. Si no puede Sales promotion account executive, debe usar [employer] bien ajustada durante 10 das cuando est cerca de otras personas en su casa y en pblico. Si no puede usar Primary school teacher cuando est cerca de otras personas, debe Educational psychologist en cuarentena durante [address] Evite a las Marketing executive o con alto riesgo de Product/process development scientist, como tambin las residencias de ancianos y otros entornos de alto riesgo, Teacher, adult education que hayan pasado al menos [address] Consulte informacin adicional sobre viajes. No vaya a lugares donde no pueda usar [address], como restaurantes y algunos gimnasios, y evite comer cerca de Economist en su casa y en  el trabajo hasta 10 das despus de su ltimo contacto estrecho con una persona con COVID-19. Despus de la cuarentena Est atento a la aparicin de sntomas hasta 10 das despus de su ltimo contacto estrecho con una persona con COVID-19. Si tiene sntomas, aslese inmediatamente y hgase la prueba. Cuarentena en entornos con congregacin de alto riesgo En ciertos entornos con congregacin en los  que existe un alto riesgo de transmisin secundaria (como correccionales y centros penitenciarios, refugios para personas sin hogar o cruceros), los CDC recomiendan una cuarentena de [address] para los residentes, independientemente de que hayan recibido o no la vacunacin y Astronomer. Durante los perodos de Retail banker crtica de personal, los establecimientos pueden optar por acortar el perodo de cuarentena del personal con el fin de garantizar la continuidad de las operaciones. La decisin de acortar la cuarentena en estos entornos debe tomarse en consulta con los departamentos de salud estatales, locales, tribales o territoriales y Chief of Staff en cuenta el contexto y las caractersticas del establecimiento. La gua especfica para cada entorno de los CDC proporciona recomendaciones adicionales para estos entornos. Aislamiento El aislamiento se [place] para separar a las personas con sospecha o confirmacin de COVID-19 de las personas sin COVID-19. Las personas que estn aisladas deben quedarse en su casa hasta que sea seguro que estn cerca de los dems. En casa, cualquier persona enferma o infectada debe separarse de los [address], o usar una mascarilla bien ajustada cuando necesite estar cerca de los dems. Las personas aisladas deben quedarse en una "habitacin de enfermos" o zona especfica y usar un bao separado si est disponible. Todas las personas que tengan COVID-19 presunto o confirmado deben quedarse en casa y Research scientist (physical sciences) de las dems personas durante al menos 5 das completos (el da 0 es Film/video editor de  sntomas o la fecha del da de la prueba viral positiva para las personas asintomticas). Deben usar mascarilla cuando estn cerca de otras personas en su casa y en pblico durante 5 das ms. Las personas con COVID-19 confirmado o que presentan sntomas de COVID-19 deben aislarse independientemente de su estado respecto de la vacunacin. Esto puede comprender lo siguiente: Personas con prueba viral positiva de COVID-19, independientemente de que tengan sntomas o no. Personas con sntomas de COVID-19, incluidas las personas que estn [employer] de la prueba o que no se han hecho la prueba. Las personas con sntomas deben aislarse aunque no sepan si han estado en contacto estrecho con alguien con COVID-19. Qu hacer durante el aislamiento Est atento a sus sntomas. Si tiene un signo de advertencia de emergencia (lo que incluye dificultad para respirar), busque atencin mdica de emergencia de manera inmediata. Si es posible, qudese en una habitacin separada de otros miembros del hogar. Use un bao aparte, si es posible. Si es posible, tome medidas para mejorar la ventilacin en su casa. Evite el contacto con los dems miembros del hogar y con las [place]. No comparta artculos de uso personal del hogar, como tazas, toallas y utensilios. Use una mascarilla bien ajustada cuando necesite estar cerca de otras personas. Obtenga ms informacin sobre qu hacer si est enfermo y cmo notificar a sus contactos. Finalizacin del aislamiento para personas que tuvieron COVID-19 con sntomas Si tuvo COVID-19 con sntomas, aslese durante al menos 5 [address]  [address]. Para calcular su perodo de aislamiento de 5 das, el da 0 es Film/video editor en que tuvo sntomas. El da 1 es Film/video editor completo despus de que aparecieron los sntomas. Puede salir del aislamiento despus de 5 das completos. Puede finalizar el aislamiento despus de 5 das completos si no tiene fiebre durante 24 horas sin el uso de  medicamentos para bajar la fiebre y si sus otros sntomas han mejorado (la prdida del gusto y Cabin crew puede persistir durante semanas o meses despus de la recuperacin y no es Publishing copy final del  aislamiento). Debe seguir usando una mascarilla bien ajustada cuando est cerca de otras personas en su casa y en pblico durante 5 das ms (del da 6 al da 10) despus del final de su perodo de aislamiento de [address]. Si no puede usar Primary school teacher cuando est cerca de otras personas, Statistician en aislamiento durante 10 das completos. Evite a las Marketing executive o con alto riesgo de Product/process development scientist, como tambin las residencias de ancianos y otros entornos de alto riesgo, Teacher, adult education que hayan pasado al menos [address]. Si sigue teniendo fiebre o sus otros sntomas no han mejorado despus de 5 das de [place], debe esperar para Software engineer su aislamiento hasta que no tenga fiebre durante 24 horas sin el uso de medicamentos para Publishing copy la fiebre y [place] que sus otros sntomas hayan mejorado. Siga usando una mascarilla bien [place]. Comunquese con su mdico si tiene alguna pregunta. Consulte informacin adicional sobre viajes. No vaya a lugares donde no pueda usar [address], como restaurantes y algunos gimnasios, y evite comer cerca de Economist en su casa y en el trabajo hasta que hayan pasado 10 das completos despus del Social worker en que tuvo sntomas. Si la persona tiene Teacher, adult education a una prueba y desea [place], el mejor enfoque es utilizar una prueba de antgenos1 Auxier final del perodo de aislamiento de [address]. Tome la [place] para la prueba nicamente si no tiene fiebre durante 24 horas sin el uso de medicamentos para Personal assistant fiebre y si sus otros sntomas han mejorado (la prdida del gusto y Cabin crew puede persistir durante semanas o meses despus de la recuperacin y no es Sports administrator el final del aislamiento por esto). Si el resultado de la prueba es positivo,  debe continuar con el aislamiento Nutritional therapist 10. Si el resultado de la prueba es negativo, puede Midwife, Biomedical engineer debe seguir usando una mascarilla bien ajustada cuando est cerca de otras personas en su casa y en pblico hasta el da 10. Siga las recomendaciones adicionales sobre el uso de Hart y sobre evitar viajar que se describen anteriormente. 1Como se indica en el etiquetado para las pruebas de antgenos sin receta autorizadas: [employer] deben tratarse como presuntos. Los [employer] no descartan la infeccin por SARS-CoV-2 y no deben utilizarse como el nico tratamiento para tomar decisiones de tratamiento o de manejo del paciente, incluidas las decisiones de control de la infeccin. Para mejorar los [place], las pruebas de antgenos deben [employer] veces en un perodo de tres das con un intervalo de al menos 24 horas y no ms de 48 horas entre pruebas. Tenga en cuenta que estas recomendaciones sobre la finalizacin del aislamiento no se aplican a Dealer con COVID-19 moderado o grave ni a personas con el sistema inmunitario debilitado (inmunodeprimidas). Consulte la seccin a continuacin para Barista las recomendaciones sobre cundo finalizar elaislamiento para estos grupos. Finalizacin del aislamiento de personas que tuvieron resultado positivo en la prueba de COVID-19 pero no tuvieron sntomas Si tiene resultado positivo de COVID-19 y nunca presenta sntomas, aslese durante al menos 5 [place]. El da 0 es el da de la prueba viral positiva (segn la fecha en que se la hizo) y Medical laboratory scientific officer 1 es Film/video editor completo despus de la toma de la muestra para la prueba que dio resultado positivo. Puede salir del aislamiento despus de 5 das completos. Si sigue sin tener sntomas, puede finalizar el aislamiento despus de al menos 5 [address]  [address]. Debe seguir  usando una mascarilla bien ajustada cuando est cerca de otras personas en su casa y en pblico hasta el da  10 (del da 6 al da 10). Si no puede usar Primary school teacher cuando est cerca de otras personas, Statistician en aislamiento durante 10 das. Evite a las Marketing executive o con alto riesgo de Product/process development scientist, como tambin las residencias de ancianos y otros entornos de alto riesgo, Teacher, adult education que hayan pasado al menos [address] Si presenta sntomas despus de recibir un resultado positivo, su perodo de aislamiento de [address] comenzar de [place]. El da 0 es Film/video editor en que tiene sntomas. Siga las recomendaciones anteriores sobre la finalizacin del aislamiento de las personas que tuvieron COVID-19 con sntomas. Consulte informacin adicional sobre viajes. No vaya a lugares donde no pueda usar [address], como restaurantes y algunos gimnasios, y Engineer, structural cerca de Economist en su casa y en el trabajo hasta que hayan pasado 10 das desde el da en que recibi el resultado positivo en la prueba. Si la persona tiene Teacher, adult education a una prueba y desea [place], el mejor enfoque es utilizar una prueba de antgenos1 [place] final del perodo de aislamiento de [address]. Si el resultado de la prueba es positivo, debe continuar con el aislamiento Nutritional therapist 10. Si el resultado de la prueba es negativo, puede Midwife, Biomedical engineer debe seguir usando una mascarilla bien ajustada cuando est cerca de otras personas en su casa y en pblico hasta el da 10. Siga las recomendaciones adicionales sobre el uso de [place] y sobre evitarviajar que se describen anteriormente. 1Como se indica en el etiquetado para las pruebas de antgenos sin receta autorizadas: [employer] deben tratarse como presuntos. [employer] no descartan la infeccin por SARS-CoV-2 y no deben utilizarse como el nico tratamiento para tomar decisiones de tratamiento o de manejo del paciente, incluidas las decisiones de control de la infeccin. Para mejorar los [place], las pruebas de antgenos deben  [employer] veces en un perodo de tres das con un intervalo de al menos 24 horas y no ms de 48 horas entre pruebas. Finalizacin del aislamiento de personas que estuvieron gravemente enfermas por COVID-19 o de personas que tienen el sistema inmunitario debilitado (inmunodeprimidas) Las personas que estn gravemente enfermas por COVID-19 (incluidas aquellas que fueron hospitalizadas o que necesitaron cuidados intensivos o asistencia respiratoria Administrator, sports) y las personas con el sistema inmunitario deprimido podran IT consultant en su casa durante ms tiempo. Es posible que tambin deban hacerse una prueba viral para determinar cundo podrn estar cerca de Economist. Los CDC recomiendan un perodo de Tourist information centre manager de un mnimo de [address] y un mximo de 20 das para las personas que estuvieron gravemente enfermas por COVID-19 y para las personas con el sistema inmunitario debilitado. Consulte a su mdico cundo Engineer, manufacturing systems a Insurance claims handler de Economist. Las personas inmunodeprimidas deben hablar con su mdico sobre la posibilidad de una menor respuesta inmunitaria a las vacunas contra el COVID-19 y la necesidad de Educational psychologist con las medidas de Higher education careers adviser (lo que incluye usar una mascarilla bien [place], [place] a 6 pies de distancia de otras personas con las que no se Sports coach, y Dietitian y espacios interiores mal ventilados) para protegerse contra el COVID-19 hasta que el mdico les indique lo contrario. Tambin se debe animar a los contactos estrechos de las personas inmunodeprimidas, incluidos los miembros del Teacher, English as a foreign language, a recibir todas las dosis recomendadas de la vacuna contra  el COVID-19 para contribuir a Public house manager. Aislamiento en entornos con congregacin de alto riesgo En ciertos entornos con congregacin de alto riesgo que tienen un alto riesgo de transmisin secundaria y donde no es factible dividir a las Office manager cohortes (como correccionales  y Architect, refugios para personas sin hogar y cruceros), los CDC recomiendan un perodo de aislamiento de 10 das para los residentes. Durante los perodos de Retail banker crtica de personal, los establecimientos pueden optar por acortar el perodo de aislamiento del personal con el fin de Lawyer la continuidad de las operaciones. La decisin de Passenger transport manager en estos entornos debe tomarse en consulta con los departamentos de salud estatales, locales, tribales o territoriales y Chief of Staff en cuenta el contexto y las caractersticas del establecimiento. La gua especfica para cada entorno de los CDC proporciona recomendaciones adicionales para estos entornos. Esta gua de los CDC est destinada a complementar (no a Microbiologist) cualquier ley, norma y reglamento federal, estatal, local, territorial o tribal sobresalud y seguridad. Recomendaciones para entornos especficos Estas recomendaciones no se aplican a los profesionales de la atencin mdica. Para obtener instrucciones especficas para estos entornos, consulte Profesionales de la atencin mdica: Gua provisional para la gestin del personal de atencin mdica con infeccin por SARS-CoV-2 o exposicin al [employer], residentes y visitantes de entornos de atencin mdica: Recomendaciones provisionales para la prevencin y el control de infecciones para el personal de atencin mdica durante la pandemia de la enfermedad por coronavirus de 2019 (COVID-19) Se encuentran disponibles recomendaciones y guas adicionales especficas de otros entornos. Estas recomendaciones sobre cuarentena y aislamiento se aplican a los entornos escolares desde el jardn de infantes hasta el 12. grado. Puede encontrar la gua adicional aqu: Descripcin general de la cuarentena por COVID-19 para escuelas de jardn de infantes hasta el 12. grado Viajeros: Informacin y recomendaciones de viaje Establecimientos y otros entornos con  Careers adviser de personas: pginas de gua para entornos comunitarios, laborales y escolares Preguntas frecuentes sobre la exposicin continua al COVID-19 Vivo con alguien con COVID-19, pero no puedo separarme de esa persona. Cmo manejamos la cuarentena en esta situacin? Es [employer] personas con COVID-19 permanezcan separadas de otras personas, si es posible, incluso si viven juntas. Si no es posible que la persona con COVID-19 se separe de las otras personas con las que vive, esas personas con las que convive tendrn exposicin continua, es decir que estarn expuestas repetidamente hasta que esa persona ya no pueda propagar el virus a Economist. En esta situacin, hay precauciones que puede tomar para limitar la propagacin del COVID-19: La persona con COVID-19 y todas las personas con las que viven deben usar una mascarilla bien ajustada dentro del Teacher, English as a foreign language. Si es posible, una sola persona debe cuidar de la persona con COVID-19 para limitar la cantidad de personas que estn en contacto estrecho con la persona infectada. Tome medidas para protegerse y Conservator, museum/gallery a los dems para reducir la transmisin en el hogar: Pngase en cuarentena si no est al da con las vacunas contra el COVID-19. Aslese si est enfermo o tuvo un resultado positivo de COVID-19, aunque no tenga sntomas. Obtenga ms informacin [employer] de salud pblica con respecto a las [place], el uso de Air traffic controller y la cuarentena de los contactos estrechos, como usted, que tienen exposicin continua. Estas recomendaciones difieren dependiendo de su estado de vacunacin. Qu debo hacer si tengo una exposicin continua al COVID-19 por alguien con quien vivo? Las recomendaciones para [employer]  situacin dependen de su estado de vacunacin: Si no est al da con las vacunas contra el COVID-19 y tiene exposicin continua al COVID-19, debe hacer lo siguiente: Iniciar la cuarentena inmediatamente y Social research officer, government durante  todo el perodo de aislamiento de la persona con COVID-19. Continuar la cuarentena durante 5 das ms a partir del da despus del final del aislamiento de la persona con COVID-19. Hacerse una prueba al menos 5 das despus del final del aislamiento de la persona infectada con Amaryllis Dyke. Si el resultado de la prueba es negativo, puede salir de su casa, Biomedical engineer debe seguir usando una mascarilla bien ajustada cuando est cerca de otras personas en su hogar y en pblico hasta 10 das despus del final del aislamiento de la persona con COVID-19. Aislarse inmediatamente si presenta sntomas de COVID-19 o si da positivo en la prueba. Si est al da con las vacunas contra el COVID-19 y tiene exposicin continua al COVID-19, debe hacer lo siguiente: Hacerse una prueba al menos 5 das despus de su primera exposicin. Una persona con COVID-19 se considera infecciosa desde los 2 [address] a presentar sntomas, o 2 das antes de la fecha de la prueba positiva si no tiene sntomas. Hacerse una prueba de nuevo al menos 5 das despus del final del aislamiento de la persona con COVID-19. Usar una mascarilla bien ajustada cuando est con la persona con COVID-19, y hacerlo durante todo su perodo de aislamiento. Use una mascarilla bien ajustada cuando est con otras personas durante 10 das despus de que finalice el perodo de aislamiento de la persona infectada. Aislarse inmediatamente si presenta sntomas de COVID-19 o si da positivo en la prueba. Qu debo hacer si varias personas con las que vivo tienen un resultado positivo de COVID-19 en diferentes momentos? Las recomendaciones para esta situacin dependen de su estado de vacunacin: Si no est al da con las vacunas contra el COVID-19, debe hacer lo siguiente: Ponerse en cuarentena durante todo el perodo de aislamiento de cualquier persona infectada con Investment banker, corporate. Continuar la cuarentena hasta 5 das despus de la fecha de finalizacin del  aislamiento de la ltima persona infectada que viva con usted. Por ejemplo, si el ltimo da de aislamiento de la ltima persona infectada con COVID-19 fue el 30 de junio, el nuevo perodo de [place] de [address] comienza el 1 de [place]. Hacerse una prueba al menos 5 das despus del final del aislamiento de la ltima persona infectada con Amaryllis Dyke. Usar una mascarilla bien ajustada cuando est cerca de cualquier persona con COVID-19 mientras esa persona est aislada. Usar una mascarilla bien ajustada cuando est con otras personas hasta 10 das despus de su ltimo contacto estrecho. Aislarse inmediatamente si presenta sntomas de COVID-19 o si da positivo en la prueba. Si est al da con las vacunas contra el COVID-19, debe hacer lo siguiente: Sol Passer prueba al menos 5 das despus de su primera exposicin. Una persona con COVID-19 se considera infecciosa desde los 2 [address] a presentar sntomas, o 2 das antes de la fecha de la prueba positiva si no tiene sntomas. Hacerse una prueba de nuevo al menos 5 das despus del final del aislamiento de la ltima persona infectada con Amaryllis Dyke. Usar una mascarilla bien ajustada cuando est cerca de cualquier persona con COVID-19 mientras esa persona est aislada. Usar una mascarilla bien ajustada cuando est con otras personas durante 10 das despus del final del aislamiento de la ltima persona infectada que viva con usted. Por  ejemplo, si el ltimo da de aislamiento de la ltima persona infectada con COVID-19 fue el 30 de junio, el nuevo perodo de [address] para usar una mascarilla bien ajustada en lugares pblicos cerrados comienza el 1 de julio. Aislarse inmediatamente si presenta sntomas de COVID-19 o si da positivo en la prueba. Tuve COVID-19 y termin el aislamiento. Tengo que ponerme en cuarentena o hacerme una prueba si alguien con quien vivo contrae COVID-19 poco despus de que termin el aislamiento? No. Si termin el  aislamiento hace poco y alguien que vive con usted da positivo en la prueba del virus que causa COVID-19 poco despus del final de su perodo de aislamiento, no tiene que ponerse en cuarentena ni hacerse una prueba siempre que no presente nuevos sntomas. Una vez que todas las personas que viven juntas hayan terminado el aislamiento o la cuarentena, consulte las siguientes directrices acerca de nuevas exposiciones al COVID-19. Si tuvo COVID-19 en los [address] anteriores y luego estuvo en contacto estrecho con alguien con COVID-19, no tiene que ponerse en cuarentena ni hacerse una prueba si no tiene sntomas. Sin embargo, Recruitment consultant lo siguiente: Usar Comptroller en lugares pblicos cerrados durante 10 das despus de la exposicin. Prestar atencin a los sntomas de COVID-19 y aislarse inmediatamente si presenta sntomas. Consultar con un profesional de la salud para obtener recomendaciones de pruebas si aparecen nuevos sntomas. Si pasaron ms de 90 das desde que se recuper de la infeccin, siga las recomendaciones de los CDC para Programmer, multimedia. Estas recomendaciones variarn dependiendo de su estado de vacunacin. 08/24/2020 Lenell Antu del contenido: [employer] for Immunization and Respiratory Diseases (NCIRD), Division of Viral Diseases (Divisin de Enfermedades Virales del [address] de Vacunacin yEnfermedades Respiratorias) Esta informacin no tiene Theme park manager el consejo del mdico. Asegresede hacerle al mdico cualquier pregunta que tenga. Document Revised: 09/08/2020 Document Reviewed: 09/08/2020 Elsevier Patient Education  2022 [employer].

## 2021-03-06 ENCOUNTER — Other Ambulatory Visit: Payer: Self-pay

## 2021-03-06 ENCOUNTER — Ambulatory Visit: Payer: Self-pay | Attending: Nurse Practitioner | Admitting: Nurse Practitioner

## 2021-03-06 ENCOUNTER — Encounter: Payer: Self-pay | Admitting: Nurse Practitioner

## 2021-03-06 DIAGNOSIS — E1165 Type 2 diabetes mellitus with hyperglycemia: Secondary | ICD-10-CM

## 2021-03-06 DIAGNOSIS — M5442 Lumbago with sciatica, left side: Secondary | ICD-10-CM

## 2021-03-06 DIAGNOSIS — G8929 Other chronic pain: Secondary | ICD-10-CM

## 2021-03-06 MED ORDER — METFORMIN HCL 500 MG PO TABS: 500.0000 mg | ORAL_TABLET | Freq: Two times a day (BID) | ORAL | 1 refills | Status: DC

## 2021-03-06 NOTE — Progress Notes (Signed)
Virtual Visit via Telephone Note Due to national recommendations of social distancing due to COVID 19, telehealth visit is felt to be most appropriate for this patient at this time.  I discussed the limitations, risks, security and privacy concerns of performing an evaluation and management service by telephone and the availability of in person appointments. I also discussed with the patient that there may be a patient responsible charge related to this service. The patient expressed understanding and agreed to proceed.    I connected with Cheryl Patrick on 03/06/21  at   8:10 AM EDT  EDT by telephone and verified that I am speaking with the correct person using two identifiers.  Location of Patient: Private Residence   [facility] Provider[facility]    Persons participating in Telemedicine visit: Bertram Denver FNP-BC Marbella Markgraf  Spanish Interpreter: [id number] Threasa Alpha   History of Present Illness: Telemedicine visit for: Back Pain Follow up  She is currently taking meloxicam 7.5 mg daily for her back pain. States pain has improved in her back and her right leg.  She was evaluated by me on 02-06-2021 with complaints at that time of 3 month onset of pain radiating from the lower back into the left hip and left leg . Initial inciting event: none. Symptoms are worst: all day. Alleviating factors identifiable by patient are none. Exacerbating factors identifiable by patient are sitting, standing, and walking. Treatments so far initiated by patient:  OTC Tylenole  Previous lower back problems: none. Previous workup: none. Previous treatments:  toradol and prednisone . Evaluated 01-11-2021 for complaints of low back pain today she states the injection of toradol and prednisolone were ineffective. She denied any symptoms of involuntary loss of urine or stool   Past Medical History:  Diagnosis Date   Anxiety    Diabetes mellitus without complication (HCC)     Hyperlipemia     Past Surgical History:  Procedure Laterality Date   DILATION AND EVACUATION N/A 05/10/2017   Procedure: DILATATION AND EVACUATION;  Surgeon: Thorsby Bing, MD;  Location: [facility];  Service: Gynecology;  Laterality: N/A;   NO PAST SURGERIES      Family History  Problem Relation Age of Onset   Lymphoma Mother    Hypertension Mother    Diabetes Mother    Diabetes Father    Hypertension Father     Social History   Socioeconomic History   Marital status: Married    Spouse name: Not on file   Number of children: 3   Years of education: Not on file   Highest education level: 9th grade  Occupational History   Occupation: International aid/development worker: [employer]  Tobacco Use   Smoking status: Never   Smokeless tobacco: Never  Vaping Use   Vaping Use: Never used  Substance and Sexual Activity   Alcohol use: No   Drug use: No   Sexual activity: Yes    Birth control/protection: Rhythm  Other Topics Concern   Not on file  Social History Narrative   Not on file   Social Determinants of Health   Financial Resource Strain: Not on file  Food Insecurity: Not on file  Transportation Needs: No Transportation Needs   Lack of Transportation (Medical): No   Lack of Transportation (Non-Medical): No  Physical Activity: Not on file  Stress: Not on file  Social Connections: Not on file     Observations/Objective: Awake, alert and oriented x 3  Review of Systems  Constitutional:  Negative for fever, malaise/fatigue and weight loss.  HENT: Negative.  Negative for nosebleeds.   Eyes: Negative.  Negative for blurred vision, double vision and photophobia.  Respiratory: Negative.  Negative for cough and shortness of breath.   Cardiovascular: Negative.  Negative for chest pain, palpitations and leg swelling.  Gastrointestinal: Negative.  Negative for heartburn, nausea and vomiting.  Musculoskeletal:  Positive for back pain. Negative for myalgias.  Neurological:  Negative.  Negative for dizziness, focal weakness, seizures and headaches (tele).  Psychiatric/Behavioral: Negative.  Negative for suicidal ideas.    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    Follow Up Instructions Return in about 3 months (around 06/06/2021) for DM.     I discussed the assessment and treatment plan with the patient. The patient was provided an opportunity to ask questions and all were answered. The patient agreed with the plan and demonstrated an understanding of the instructions.   The patient was advised to call back or seek an in-person evaluation if the symptoms worsen or if the condition fails to improve as anticipated.  I provided 11 minutes of non-face-to-face time during this encounter including median intraservice time, reviewing previous notes, labs, imaging, medications and explaining diagnosis and management.  Claiborne Rigg, FNP-BC

## 2021-03-26 ENCOUNTER — Other Ambulatory Visit: Payer: Self-pay | Admitting: Obstetrics and Gynecology

## 2021-03-26 DIAGNOSIS — Z1231 Encounter for screening mammogram for malignant neoplasm of breast: Secondary | ICD-10-CM

## 2021-03-29 ENCOUNTER — Other Ambulatory Visit: Payer: Self-pay

## 2021-03-29 ENCOUNTER — Ambulatory Visit: Payer: Self-pay | Admitting: *Deleted

## 2021-03-29 ENCOUNTER — Ambulatory Visit
Admission: RE | Admit: 2021-03-29 | Discharge: 2021-03-29 | Disposition: A | Discharge status: Home / Self Care | Payer: No Typology Code available for payment source | Source: Ambulatory Visit | Attending: Nurse Practitioner | Admitting: Nurse Practitioner

## 2021-03-29 VITALS — BP 110/80 | Wt 164.8 lb

## 2021-03-29 DIAGNOSIS — Z1211 Encounter for screening for malignant neoplasm of colon: Secondary | ICD-10-CM

## 2021-03-29 DIAGNOSIS — Z1239 Encounter for other screening for malignant neoplasm of breast: Secondary | ICD-10-CM

## 2021-03-29 NOTE — Progress Notes (Signed)
[name] is a 50 y.o. female who presents to [facility] clinic today with no complaints.    Pap Smear: Pap smear not completed today. Last Pap smear was 03/14/2020 at [facility] clinic and was normal with negative HPV. Per patient has no history of an abnormal Pap smear. Last Pap smear result is available in Epic.   Physical exam: Breasts Breasts symmetrical. No skin abnormalities bilateral breasts. No nipple retraction bilateral breasts. No nipple discharge bilateral breasts. No lymphadenopathy. No lumps palpated bilateral breasts. No complaints of pain or tenderness on exam.  MS DIGITAL SCREENING TOMO BILATERAL  Result Date: 03/15/2020 CLINICAL DATA:  Screening. EXAM: DIGITAL SCREENING BILATERAL MAMMOGRAM WITH TOMO AND CAD COMPARISON:  Previous exam(s). ACR Breast Density Category b: There are scattered areas of fibroglandular density. FINDINGS: There are no findings suspicious for malignancy. Images were processed with CAD. IMPRESSION: No mammographic evidence of malignancy. A result letter of this screening mammogram will be mailed directly to the patient. RECOMMENDATION: Screening mammogram in one year. (Code:SM-B-01Y) BI-RADS CATEGORY  1: Negative. Electronically Signed   By: Romona Curls M.D.   On: 03/15/2020 13:24        Pelvic/Bimanual Pap is not indicated today per BCCCP guidelines.   Smoking History: Patient has never smoked.   Patient Navigation: Patient education provided. Access to services provided for patient through [place] program. Spanish interpreter Natale Lay from [facility] provided.   Colorectal Cancer Screening: Per patient has never had colonoscopy completed. FIT Test given to patient to complete. No complaints today.    Breast and Cervical Cancer Risk Assessment: Patient does not have family history of breast cancer, known genetic mutations, or radiation treatment to the chest before age 28. Patient does not have history of cervical dysplasia, immunocompromised, or  DES exposure in-utero.  Risk Assessment     Risk Scores       03/29/2021 03/14/2020   Last edited by: Meryl Dare, CMA Meryl Dare, CMA   5-year risk: 0.7 % 0.7 %   Lifetime risk: 6.6 % 6.7 %            A: BCCCP exam without pap smear No complaints.  P: Referred patient to the [facility] for a screening mammogram on mobile unit. Appointment scheduled Thursday, March 29, 2021 at 0930.  Priscille Heidelberg, RN 03/29/2021 8:47 AM

## 2021-03-29 NOTE — Patient Instructions (Signed)
Explained breast self awareness with Cheryl Patrick. Patient did not need a Pap smear today due to last Pap smear and HPV typing was 03/14/2020. Let her know BCCCP will cover Pap smears and HPV typing every 5 years unless has a history of abnormal Pap smears. Referred patient to the [facility] for a screening mammogram on mobile unit. Appointment scheduled Thursday, March 29, 2021 at 0930. Patient escorted to the mobile unit following BCCCP appointment for her screening mammogram. Let patient know the Breast Center will follow up with her within the next couple weeks with results of her mammogram by letter or phone. Cheryl Patrick verbalized understanding.  Jarreau Callanan, Kathaleen Maser, RN 8:47 AM

## 2021-04-04 ENCOUNTER — Ambulatory Visit: Payer: No Typology Code available for payment source

## 2021-04-04 ENCOUNTER — Other Ambulatory Visit: Payer: No Typology Code available for payment source

## 2021-04-04 ENCOUNTER — Other Ambulatory Visit: Payer: Self-pay | Admitting: Obstetrics and Gynecology

## 2021-04-04 DIAGNOSIS — R928 Other abnormal and inconclusive findings on diagnostic imaging of breast: Secondary | ICD-10-CM

## 2021-04-16 ENCOUNTER — Other Ambulatory Visit: Payer: Self-pay

## 2021-04-16 ENCOUNTER — Inpatient Hospital Stay: Payer: Self-pay | Attending: Obstetrics and Gynecology | Admitting: *Deleted

## 2021-04-16 VITALS — BP 132/84 | Ht 62.0 in | Wt 164.4 lb

## 2021-04-16 DIAGNOSIS — Z0000 Encounter for general adult medical examination without abnormal findings: Secondary | ICD-10-CM

## 2021-04-16 NOTE — Progress Notes (Signed)
[facility] initial screening   Interpreter- Natale Lay, [employer]   Clinical Measurement: There were no vitals filed for this visit. Fasting Labs Drawn Today, will review with patient when they result.   Medical History:  Patient states that she  does not know if she has  high cholesterol, does not have high blood pressure and she has diabetes.  Medications:  Patient states that she does take medication to lower cholesterol and blood sugar. Patient does not take medication to lower blood pressure. Patient does not take an aspirin a day to help prevent a heart attack or stroke. During the past 7 days patient has taken prescribed medication to lower cholesterol and blood sugar on 7 days.   Blood pressure, self measurement:  :  Patient states that she does not measure blood pressure from home. She checks her blood pressure N/A. She shares her readings with a health care provider: N/A.   Nutrition: Patient states that on average she eats 3 cups of fruit and 2 cups of vegetables per day. Patient states that she does eat fish at least 2 times per week. Patient eats about half servings of whole grains. Patient drinks less than 36 ounces of beverages with added sugar weekly: yes. Patient is currently watching sodium or salt intake: yes. In the past 7 days patient has consumed drinks containing alcohol on 0 days. On a day that patient consumes drinks containing alcohol on average 0 drinks are consumed.      Physical activity:  Patient states that she gets 80 minutes of moderate and 0 minutes of vigorous physical activity each week.  Smoking status:  Patient states that she has has never smoked .   Quality of life:  Over the past 2 weeks patient states that she had little interest or pleasure in doing things: not at all. She has been feeling down, depressed or hopeless:not at all.    Risk reduction and counseling:   Health Coaching: Spoke with patient about cholesterol levels and the difference between  good and bad cholesterol. Gave suggestions to patient about things that she can add into diet to raise HDL (olive oil, avocados, heart healthy fish and whole grains.) Encouraged patient to continue watching the amount of sweets and sugars that she consumes as well as carbs due to hx of diabetes. Patient has been walking 4 days a week for 20 minutes. Patient has been unable to do any strenuous activity because of sciatica pain that she has been experiencing. Patient has been doing stretching exercises that PCP gave her to do at home.     Navigation:  I will notify patient of lab results.  Patient is aware of 2 more health coaching sessions and a follow up.  Time: 20 minutes

## 2021-04-17 LAB — LIPID PANEL
Chol/HDL Ratio: 5.1 ratio — ABNORMAL HIGH (ref 0.0–4.4)
Cholesterol, Total: 194 mg/dL (ref 100–199)
HDL: 38 mg/dL — ABNORMAL LOW
LDL Chol Calc (NIH): 107 mg/dL — ABNORMAL HIGH (ref 0–99)
Triglycerides: 284 mg/dL — ABNORMAL HIGH (ref 0–149)
VLDL Cholesterol Cal: 49 mg/dL — ABNORMAL HIGH (ref 5–40)

## 2021-04-17 LAB — HEMOGLOBIN A1C
Est. average glucose Bld gHb Est-mCnc: 154 mg/dL
Hgb A1c MFr Bld: 7 % — ABNORMAL HIGH (ref 4.8–5.6)

## 2021-04-17 LAB — GLUCOSE, RANDOM: Glucose: 160 mg/dL — ABNORMAL HIGH (ref 65–99)

## 2021-04-23 ENCOUNTER — Telehealth: Payer: Self-pay

## 2021-04-23 NOTE — Telephone Encounter (Signed)
Left message for patient via Erika McReynolds, [employer] about lab results from [facility]. Left name and number for patient to call back.   

## 2021-04-23 NOTE — Telephone Encounter (Signed)
Health coaching 2   interpreter- Natale Lay, [facility]   Labs- 194 cholesterol, 107 LDL cholesterol, 284 triglycerides, 38 HDL cholesterol, 7.0 hemoglobin A1C, 160 mean plasma glucose   Patient understands and is aware of her lab results.   Goals- 1. Reduce the amount of fried and fatty foods consumed. 2. Reduce the amount of red meat consumed. Substitute for lean protein like chicken, heart healthy fish and [place]. 3. Reduce the amount of whole fat dairy products consumed. Look for low-fat or reduced-fat options instead.  4. Increase the amount of whole grains consumed. 5. Increase the amount of healthy fats consumed to raise HDL. Gave examples of healthy fats. 6. Continue watching the amount of sweets and sugars consumed in both food and drinks as well as carbs.  7. Try to exercise for at least 20 minutes a day.   Navigation:  Patient is aware of 1 more health coaching sessions and a follow up.Patient is scheduled for follow-up with [facility] on Monday, May 28, 2021 @ 4:10 pm for elevated labs.  Time-  15 minutes

## 2021-04-26 ENCOUNTER — Ambulatory Visit: Payer: No Typology Code available for payment source

## 2021-04-26 ENCOUNTER — Ambulatory Visit
Admission: RE | Admit: 2021-04-26 | Discharge: 2021-04-26 | Disposition: A | Discharge status: Home / Self Care | Payer: No Typology Code available for payment source | Source: Ambulatory Visit | Attending: Obstetrics and Gynecology | Admitting: Obstetrics and Gynecology

## 2021-04-26 ENCOUNTER — Other Ambulatory Visit: Payer: Self-pay

## 2021-04-26 DIAGNOSIS — R928 Other abnormal and inconclusive findings on diagnostic imaging of breast: Secondary | ICD-10-CM

## 2021-05-28 ENCOUNTER — Ambulatory Visit: Payer: No Typology Code available for payment source | Admitting: Nurse Practitioner

## 2021-08-01 ENCOUNTER — Other Ambulatory Visit (HOSPITAL_COMMUNITY): Payer: Self-pay

## 2021-10-11 ENCOUNTER — Encounter: Payer: Self-pay | Admitting: Physician Assistant

## 2021-10-11 ENCOUNTER — Ambulatory Visit: Payer: No Typology Code available for payment source | Attending: Physician Assistant | Admitting: Physician Assistant

## 2021-10-11 ENCOUNTER — Other Ambulatory Visit: Payer: Self-pay

## 2021-10-11 VITALS — BP 131/77 | HR 80 | Resp 18 | Ht 62.0 in | Wt 163.2 lb

## 2021-10-11 DIAGNOSIS — E1165 Type 2 diabetes mellitus with hyperglycemia: Secondary | ICD-10-CM

## 2021-10-11 DIAGNOSIS — G8929 Other chronic pain: Secondary | ICD-10-CM

## 2021-10-11 DIAGNOSIS — M5442 Lumbago with sciatica, left side: Secondary | ICD-10-CM

## 2021-10-11 DIAGNOSIS — H524 Presbyopia: Secondary | ICD-10-CM

## 2021-10-11 DIAGNOSIS — E785 Hyperlipidemia, unspecified: Secondary | ICD-10-CM

## 2021-10-11 LAB — POCT GLYCOSYLATED HEMOGLOBIN (HGB A1C): Hemoglobin A1C: 6.7 % — AB (ref 4.0–5.6)

## 2021-10-11 LAB — GLUCOSE, POCT (MANUAL RESULT ENTRY): POC Glucose: 165 mg/dl — AB (ref 70–99)

## 2021-10-11 MED ORDER — METFORMIN HCL 500 MG PO TABS: ORAL_TABLET | ORAL | 1 refills | Status: DC

## 2021-10-11 MED ORDER — ATORVASTATIN CALCIUM 40 MG PO TABS: 40.0000 mg | ORAL_TABLET | Freq: Every day | ORAL | 1 refills | Status: DC

## 2021-10-11 MED ORDER — MELOXICAM 7.5 MG PO TABS: 7.5000 mg | ORAL_TABLET | Freq: Every day | ORAL | 1 refills | Status: DC

## 2021-10-11 NOTE — Progress Notes (Signed)
Patient ID: Cheryl Patrick, female   DOB: [birth year], 51 y.o.   MRN: [id number] ? ? ?Cheryl Patrick, is a 51 y.o. female ? ?Cheryl Patrick ? ?Cheryl Patrick ? ?DOB - [birth year] ? ?Chief Complaint  ?Patient presents with  ? Diabetes  ?    ? ?Subjective:  ? ?Cheryl Patrick is a 51 y.o. female here today for med RF.  When she checks her blood sugars at home, they are ~150-180.  No hypoglycemia and no blood sugars over 200.  She has been out of atorvastatin for about 2 months.  She still has a few days worth of metformin-currently taking 500mg  bid.   ? ?Also complains of blurry vision when she tries to read something.  This has been gradual over the last few years.   ? ?Also needs meloxicam for occasional flares of lower back pain.  No new s/sx.   ? ?No problems updated. ? ?ALLERGIES: ?No Known Allergies ? ?PAST MEDICAL HISTORY: ?Past Medical History:  ?Diagnosis Date  ? Anxiety   ? Diabetes mellitus without complication (HCC)   ? Hyperlipemia   ? ? ?MEDICATIONS AT HOME: ?Prior to Admission medications   ?Medication Sig Start Date End Date Taking? Authorizing Provider  ?omeprazole (PRILOSEC) 20 MG capsule Take 1 capsule (20 mg total) by mouth daily. 01/11/21  Yes Mayers, Cari S, PA-C  ?acetaminophen (TYLENOL) 500 MG tablet Take 500 mg by mouth every 6 (six) hours as needed for moderate pain.  ?Patient not taking: Reported on 10/11/2021    [provider]  ?atorvastatin (LIPITOR) 40 MG tablet Take 1 tablet (40 mg total) by mouth daily. 10/11/21   10/13/21, PA-C  ?meloxicam (MOBIC) 7.5 MG tablet Take 1 tablet (7.5 mg total) by mouth daily. 10/11/21   10/13/21, PA-C  ?metFORMIN (GLUCOPHAGE) 500 MG tablet 2 in the morning with food and 1 in the evening with food 10/11/21   10/13/21, PA-C  ?triamcinolone ointment (KENALOG) 0.5 % Apply 1 application topically 2 (two) times daily. ?Patient not taking: Reported on 10/11/2021 02/22/21   Mayers, 02/24/21, PA-C  ? ? ?ROS: ?Neg HEENT ?Neg resp ?Neg  cardiac ?Neg GI ?Neg GU ?Neg psych ?Neg neuro ? ?Objective:  ? ?Vitals:  ? 10/11/21 0856  ?BP: 131/77  ?Pulse: 80  ?Resp: 18  ?SpO2: 99%  ?Weight: 163 lb 4 oz (74 kg)  ?Height: 5\' 2"  (1.575 m)  ? ?Exam ?General appearance : Awake, alert, not in any distress. Speech Clear. Not toxic looking ?HEENT: Atraumatic and Normocephalic ?Neck: Supple, no JVD. No cervical lymphadenopathy.  ?Chest: Good air entry bilaterally, CTAB.  No rales/rhonchi/wheezing ?CVS: S1 S2 regular, no murmurs.  ?Extremities: B/L Lower Ext shows no edema, both legs are warm to touch ?Neurology: Awake alert, and oriented X 3, CN II-XII intact, Non focal ?Skin: No Rash ? ?Data Review ?Lab Results  ?Component Value Date  ? HGBA1C 7.0 (H) 04/16/2021  ? HGBA1C 6.5 (A) 01/11/2021  ? HGBA1C 6.8 (A) 08/02/2020  ? ? ?Assessment & Plan  ? ?1. Type 2 diabetes mellitus with hyperglycemia, without long-term current use of insulin (HCC) ?Improved with A1C=6.7 today, but I will increase her metformin to 2 in the morning and 1 in the evening.  Decrease starchy and sugary foods ?- Glucose (CBG) ?- HgB A1c ?- Comprehensive metabolic panel ?- Lipid panel ?- CBC with Differential/Platelet ?- metFORMIN (GLUCOPHAGE) 500 MG tablet; 2 in the morning with food and 1 in the evening with food  Dispense: 270  tablet; Refill: 1 ?- Ambulatory referral to Ophthalmology ? ?2. Dyslipidemia, goal LDL below 70 ?Lipids will likely be high bc not on meds ?- Comprehensive metabolic panel ?- Lipid panel ?- CBC with Differential/Platelet ?- atorvastatin (LIPITOR) 40 MG tablet; Take 1 tablet (40 mg total) by mouth daily.  Dispense: 90 tablet; Refill: 1 ? ?3. Chronic left-sided low back pain with left-sided sciatica ?- meloxicam (MOBIC) 7.5 MG tablet; Take 1 tablet (7.5 mg total) by mouth daily.  Dispense: 30 tablet; Refill: 1 ? ?4. Language barrier ?[employer] interpreters "Mikle Bosworth" used and additional time performing visit was required. ? ? ?5. Presbyopia ?- Ambulatory referral to  Ophthalmology ? ? ? ?Patient have been counseled extensively about nutrition and exercise. Other issues discussed during this visit include: low cholesterol diet, weight control and daily exercise, foot care, annual eye examinations at Ophthalmology, importance of adherence with medications and regular follow-up. We also discussed long term complications of uncontrolled diabetes and hypertension.  ? ?Return in about 4 months (around 02/10/2022) for see PCP for chronic conditions. ? ?The patient was given clear instructions to go to ER or return to medical center if symptoms don't improve, worsen or new problems develop. The patient verbalized understanding. The patient was told to call to get lab results if they haven't heard anything in the next week.  ? ? ? ? ?Georgian Co, PA-C ?[facility] ?[place], [place] ?[phone]   ?10/11/2021, 9:13 AM  ?

## 2021-10-12 LAB — COMPREHENSIVE METABOLIC PANEL
ALT: 23 IU/L (ref 0–32)
AST: 20 IU/L (ref 0–40)
Albumin/Globulin Ratio: 1.9 (ref 1.2–2.2)
Albumin: 4.5 g/dL (ref 3.8–4.8)
Alkaline Phosphatase: 112 IU/L (ref 44–121)
BUN/Creatinine Ratio: 29 — ABNORMAL HIGH (ref 9–23)
BUN: 19 mg/dL (ref 6–24)
Bilirubin Total: 0.3 mg/dL (ref 0.0–1.2)
CO2: 26 mmol/L (ref 20–29)
Calcium: 9.6 mg/dL (ref 8.7–10.2)
Chloride: 100 mmol/L (ref 96–106)
Creatinine, Ser: 0.65 mg/dL (ref 0.57–1.00)
Globulin, Total: 2.4 g/dL (ref 1.5–4.5)
Glucose: 141 mg/dL — ABNORMAL HIGH (ref 70–99)
Potassium: 4.1 mmol/L (ref 3.5–5.2)
Sodium: 140 mmol/L (ref 134–144)
Total Protein: 6.9 g/dL (ref 6.0–8.5)
eGFR: 107 mL/min/{1.73_m2} (ref >59)

## 2021-10-12 LAB — LIPID PANEL
Chol/HDL Ratio: 4.9 ratio — ABNORMAL HIGH (ref 0.0–4.4)
Cholesterol, Total: 153 mg/dL (ref 100–199)
HDL: 31 mg/dL — ABNORMAL LOW (ref >39)
LDL Chol Calc (NIH): 68 mg/dL (ref 0–99)
Triglycerides: 341 mg/dL — ABNORMAL HIGH (ref 0–149)
VLDL Cholesterol Cal: 54 mg/dL — ABNORMAL HIGH (ref 5–40)

## 2021-10-12 LAB — CBC WITH DIFFERENTIAL/PLATELET
Basophils Absolute: 0.1 10*3/uL (ref 0.0–0.2)
Basos: 1 %
EOS (ABSOLUTE): 0.1 10*3/uL (ref 0.0–0.4)
Eos: 1 %
Hematocrit: 42.5 % (ref 34.0–46.6)
Hemoglobin: 14.1 g/dL (ref 11.1–15.9)
Immature Grans (Abs): 0 10*3/uL (ref 0.0–0.1)
Immature Granulocytes: 0 %
Lymphocytes Absolute: 2.1 10*3/uL (ref 0.7–3.1)
Lymphs: 33 %
MCH: 27.3 pg (ref 26.6–33.0)
MCHC: 33.2 g/dL (ref 31.5–35.7)
MCV: 82 fL (ref 79–97)
Monocytes Absolute: 0.4 10*3/uL (ref 0.1–0.9)
Monocytes: 7 %
Neutrophils Absolute: 3.6 10*3/uL (ref 1.4–7.0)
Neutrophils: 58 %
Platelets: 214 10*3/uL (ref 150–450)
RBC: 5.16 x10E6/uL (ref 3.77–5.28)
RDW: 13.2 % (ref 11.7–15.4)
WBC: 6.3 10*3/uL (ref 3.4–10.8)

## 2021-10-23 ENCOUNTER — Other Ambulatory Visit: Payer: Self-pay | Admitting: Pharmacist

## 2021-10-23 DIAGNOSIS — K219 Gastro-esophageal reflux disease without esophagitis: Secondary | ICD-10-CM

## 2021-10-23 MED ORDER — OMEPRAZOLE 20 MG PO CPDR: 20.0000 mg | DELAYED_RELEASE_CAPSULE | Freq: Every day | ORAL | 3 refills | Status: DC

## 2021-11-07 ENCOUNTER — Telehealth: Payer: Self-pay

## 2021-11-07 NOTE — Telephone Encounter (Signed)
Left message via Erika McReynolds, [employer] for patient about completing HC 3 for the [facility] program. Left name and number for patient to call back.  ?

## 2022-01-02 ENCOUNTER — Other Ambulatory Visit: Payer: Self-pay

## 2022-02-08 ENCOUNTER — Other Ambulatory Visit (HOSPITAL_COMMUNITY): Payer: Self-pay

## 2022-05-16 ENCOUNTER — Other Ambulatory Visit: Payer: Self-pay

## 2022-05-16 DIAGNOSIS — Z1231 Encounter for screening mammogram for malignant neoplasm of breast: Secondary | ICD-10-CM

## 2022-06-25 ENCOUNTER — Ambulatory Visit
Admission: RE | Admit: 2022-06-25 | Discharge: 2022-06-25 | Disposition: A | Discharge status: Home / Self Care | Payer: No Typology Code available for payment source | Source: Ambulatory Visit | Attending: Obstetrics and Gynecology | Admitting: Obstetrics and Gynecology

## 2022-06-25 ENCOUNTER — Ambulatory Visit: Payer: Self-pay | Admitting: *Deleted

## 2022-06-25 VITALS — BP 128/73 | Wt 156.0 lb

## 2022-06-25 DIAGNOSIS — Z1231 Encounter for screening mammogram for malignant neoplasm of breast: Secondary | ICD-10-CM

## 2022-06-25 DIAGNOSIS — Z1211 Encounter for screening for malignant neoplasm of colon: Secondary | ICD-10-CM

## 2022-06-25 DIAGNOSIS — Z1239 Encounter for other screening for malignant neoplasm of breast: Secondary | ICD-10-CM

## 2022-06-25 NOTE — Progress Notes (Signed)
[name] is a 51 y.o. female who presents to [facility] clinic today with no complaints.    Pap Smear: Pap smear not completed today. Last Pap smear was 03/14/2020 at [facility] clinic and was normal with negative HPV. Per patient has no history of an abnormal Pap smear. Last Pap smear result is available in Epic.    Physical exam: Breasts Breasts symmetrical. No skin abnormalities bilateral breasts. No nipple retraction bilateral breasts. No nipple discharge bilateral breasts. No lymphadenopathy. No lumps palpated bilateral breasts. No complaints of pain or tenderness on exam.   MS DIGITAL DIAG TOMO UNI LEFT  Result Date: 04/26/2021 CLINICAL DATA:  Screening recall for left breast asymmetry. EXAM: DIGITAL DIAGNOSTIC UNILATERAL LEFT MAMMOGRAM WITH TOMOSYNTHESIS AND CAD TECHNIQUE: Left digital diagnostic mammography and breast tomosynthesis was performed. The images were evaluated with computer-aided detection. COMPARISON:  Previous exams. ACR Breast Density Category b: There are scattered areas of fibroglandular density. FINDINGS: Additional tomograms were performed of the left breast. The initially questioned possible left breast asymmetry resolves on the additional imaging with findings compatible with an area of overlapping fibroglandular tissue. There is no mammographic evidence of malignancy in the left breast. IMPRESSION: No mammographic evidence of malignancy in the left breast. RECOMMENDATION: Screening mammogram in one year.(Code:SM-B-01Y) I have discussed the findings and recommendations with the patient. If applicable, a reminder letter will be sent to the patient regarding the next appointment. BI-RADS CATEGORY  1: Negative. Electronically Signed   By: Cheryl Patrick M.D.   On: 04/26/2021 15:03   MM 3D SCREEN BREAST BILATERAL  Result Date: 04/03/2021 CLINICAL DATA:  Screening. EXAM: DIGITAL SCREENING BILATERAL MAMMOGRAM WITH TOMOSYNTHESIS AND CAD TECHNIQUE: Bilateral screening digital  craniocaudal and mediolateral oblique mammograms were obtained. Bilateral screening digital breast tomosynthesis was performed. The images were evaluated with computer-aided detection. COMPARISON:  Previous exam(s). ACR Breast Density Category b: There are scattered areas of fibroglandular density. FINDINGS: In the left breast, a possible asymmetry warrants further evaluation. In the right breast, no findings suspicious for malignancy. IMPRESSION: Further evaluation is suggested for possible asymmetry in the left breast. RECOMMENDATION: Diagnostic mammogram and possibly ultrasound of the left breast. (Code:FI-L-40M) The patient will be contacted regarding the findings, and additional imaging will be scheduled. BI-RADS CATEGORY  0: Incomplete. Need additional imaging evaluation and/or prior mammograms for comparison. Electronically Signed   By: Cheryl Patrick M.D.   On: 04/03/2021 07:50   MS DIGITAL SCREENING TOMO BILATERAL  Result Date: 03/15/2020 CLINICAL DATA:  Screening. EXAM: DIGITAL SCREENING BILATERAL MAMMOGRAM WITH TOMO AND CAD COMPARISON:  Previous exam(s). ACR Breast Density Category b: There are scattered areas of fibroglandular density. FINDINGS: There are no findings suspicious for malignancy. Images were processed with CAD. IMPRESSION: No mammographic evidence of malignancy. A result letter of this screening mammogram will be mailed directly to the patient. RECOMMENDATION: Screening mammogram in one year. (Code:SM-B-01Y) BI-RADS CATEGORY  1: Negative. Electronically Signed   By: Cheryl Patrick M.D.   On: 03/15/2020 13:24        Pelvic/Bimanual Pap is not indicated today per BCCCP guidelines.    Smoking History: Patient has never smoked.    Patient Navigation: Patient education provided. Access to services provided for patient through [place] program. Spanish interpreter Cheryl Patrick from [facility] provided.   Colorectal Cancer Screening: Per patient has never had colonoscopy completed.  FIT Test given to patient to complete. No complaints today.    Breast and Cervical Cancer Risk Assessment: Patient does not have family  history of breast cancer, known genetic mutations, or radiation treatment to the chest before age 65. Patient does not have history of cervical dysplasia, immunocompromised, or DES exposure in-utero.  Risk Scores as of 06/25/2022     Cheryl Patrick           5-year 0.69 %   Lifetime 6.53 %   This patient is Hisp[place]/Latina but has no documented birth country, so the [place] used data from [place] patients to calculate their risk score. Document a birth country in the Demographics activity for a more accurate score.         Last calculated by Cheryl Parish, RN on 06/25/2022 at 10:47 AM       A: BCCCP exam without pap smear No complaints.  P: Referred patient to the [facility] for a screening mammogram on mobile unit. Appointment scheduled Tuesday, June 25, 2022 at 1030.   Cheryl Parish, RN 06/25/2022 10:22 AM

## 2022-06-25 NOTE — Patient Instructions (Addendum)
Explained breast self awareness with [name]. Patient did not need a Pap smear today due to last Pap smear and HPV typing was 03/14/2020. Let her know BCCCP will cover Pap smears and HPV typing every 5 years unless has a history of abnormal Pap smears. Referred patient to the [facility] for a screening mammogram on mobile unit. Appointment scheduled Tuesday, June 25, 2022 at 1030. Patient aware of appointment and will be there. Let patient know the Breast Center will follow up with her within the next couple weeks with results of her mammogram by letter or phone. [name] verbalized understanding.  Kelcey Korus, Kathaleen Maser, RN 10:22 AM

## 2022-06-27 ENCOUNTER — Other Ambulatory Visit: Payer: Self-pay | Admitting: Obstetrics and Gynecology

## 2022-06-27 DIAGNOSIS — R928 Other abnormal and inconclusive findings on diagnostic imaging of breast: Secondary | ICD-10-CM

## 2022-06-30 LAB — FECAL OCCULT BLOOD, IMMUNOCHEMICAL: Fecal Occult Bld: NEGATIVE

## 2022-07-03 ENCOUNTER — Telehealth: Payer: Self-pay

## 2022-07-03 NOTE — Telephone Encounter (Signed)
Telephone call made to the pt with Interpreter, Cheryl Patrick. Pt has been advised of her negative FIT test and understands she will need to repeat it once a year. Pt also understands she has a follow-up appt at the Breast Center on 07/08/22 at 9:30am and plans to present for the exam. Pt also understands if she no shows the appt without cancelling it in advance, she will be subject to a $75 fee. All questions were answered and no further assistance was needed at this time.

## 2022-07-08 ENCOUNTER — Ambulatory Visit: Payer: No Typology Code available for payment source

## 2022-07-08 ENCOUNTER — Ambulatory Visit
Admission: RE | Admit: 2022-07-08 | Discharge: 2022-07-08 | Disposition: A | Discharge status: Home / Self Care | Payer: No Typology Code available for payment source | Source: Ambulatory Visit | Attending: Obstetrics and Gynecology | Admitting: Obstetrics and Gynecology

## 2022-07-08 DIAGNOSIS — R928 Other abnormal and inconclusive findings on diagnostic imaging of breast: Secondary | ICD-10-CM

## 2022-08-30 ENCOUNTER — Other Ambulatory Visit: Payer: Self-pay | Admitting: Nurse Practitioner

## 2022-08-30 DIAGNOSIS — G8929 Other chronic pain: Secondary | ICD-10-CM

## 2022-08-30 DIAGNOSIS — K219 Gastro-esophageal reflux disease without esophagitis: Secondary | ICD-10-CM

## 2022-08-30 DIAGNOSIS — E1165 Type 2 diabetes mellitus with hyperglycemia: Secondary | ICD-10-CM

## 2022-08-30 MED ORDER — METFORMIN HCL 500 MG PO TABS: ORAL_TABLET | ORAL | 0 refills | Status: DC

## 2022-08-30 MED ORDER — MELOXICAM 7.5 MG PO TABS: 7.5000 mg | ORAL_TABLET | Freq: Every day | ORAL | 0 refills | Status: DC

## 2022-08-30 MED ORDER — OMEPRAZOLE 20 MG PO CPDR: 20.0000 mg | DELAYED_RELEASE_CAPSULE | Freq: Every day | ORAL | 0 refills | Status: DC

## 2022-08-30 NOTE — Telephone Encounter (Signed)
Requested Prescriptions  Pending Prescriptions Disp Refills   metFORMIN (GLUCOPHAGE) 500 MG tablet 270 tablet 0    Sig: 2 in the morning with food and 1 in the evening with food     Endocrinology:  Diabetes - Biguanides Failed - 08/30/2022  1:28 PM      Failed - HBA1C is between 0 and 7.9 and within 180 days    Hemoglobin A1C  Date Value Ref Range Status  10/11/2021 6.7 (A) 4.0 - 5.6 % Final   Hgb A1c MFr Bld  Date Value Ref Range Status  04/16/2021 7.0 (H) 4.8 - 5.6 % Final    Comment:             Prediabetes: 5.7 - 6.4          Diabetes: >6.4          Glycemic control for adults with diabetes: <7.0          Failed - B12 Level in normal range and within 720 days    No results found for: "VITAMINB12"       Failed - Valid encounter within last 6 months    Recent Outpatient Visits           10 months ago Type 2 diabetes mellitus with hyperglycemia, without long-term current use of insulin [facility])   [place], [place], [place]   1 year ago Chronic left-sided low back pain with left-sided sciatica   [place], [place] W, NP   1 year ago Dermatitis   [facility], [place], [place]   1 year ago Chronic left-sided low back pain with left-sided sciatica   [place], [place] W, NP   2 years ago Elevated TSH   [place], Vernia Buff, NP       Future Appointments             In 1 month Gildardo Pounds, NP [place] in normal range and within 360 days    Creat  Date Value Ref Range Status  07/06/2015 0.64 0.50 - 1.10 mg/dL Final   Creatinine, Ser  Date Value Ref Range Status  10/11/2021 0.65 0.57 - 1.00 mg/dL Final         Passed - eGFR in normal range and within 360 days    GFR calc Af Amer  Date Value Ref  Range Status  08/02/2020 125 >59 mL/min/1.73 Final    Comment:    **In accordance with recommendations from the NKF-ASN Task force,**   [facility] is in the process of updating its eGFR calculation to the   2021 CKD-EPI creatinine equation that estimates kidney function   without a race variable.    GFR, Estimated  Date Value Ref Range Status  11/16/2020 >60 >60 mL/min Final    Comment:    (NOTE) Calculated using the CKD-EPI Creatinine Equation (2021)    eGFR  Date Value Ref Range Status  10/11/2021 107 >59 mL/min/1.73 Final         Passed - CBC within normal limits and completed in the last 12 months    WBC  Date Value Ref Range Status  10/11/2021 6.3 3.4 - 10.8 x10E3/uL Final  11/16/2020 8.2 4.0 - 10.5  K/uL Final   RBC  Date Value Ref Range Status  10/11/2021 5.16 3.77 - 5.28 x10E6/uL Final  11/16/2020 4.70 3.87 - 5.11 MIL/uL Final   Hemoglobin  Date Value Ref Range Status  10/11/2021 14.1 11.1 - 15.9 g/dL Final   Hematocrit  Date Value Ref Range Status  10/11/2021 42.5 34.0 - 46.6 % Final   MCHC  Date Value Ref Range Status  10/11/2021 33.2 31.5 - 35.7 g/dL Final  11/16/2020 32.1 30.0 - 36.0 g/dL Final   [facility]  Date Value Ref Range Status  10/11/2021 27.3 26.6 - 33.0 pg Final  11/16/2020 27.2 26.0 - 34.0 pg Final   MCV  Date Value Ref Range Status  10/11/2021 82 79 - 97 fL Final   Platelet Count, POC  Date Value Ref Range Status  10/23/2013 260 142 - 424 K/uL Final   RDW  Date Value Ref Range Status  10/11/2021 13.2 11.7 - 15.4 % Final   RDW, POC  Date Value Ref Range Status  10/23/2013 14.2 % Final          omeprazole (PRILOSEC) 20 MG capsule 90 capsule 0    Sig: Take 1 capsule (20 mg total) by mouth daily.     Gastroenterology: Proton Pump Inhibitors Passed - 08/30/2022  1:28 PM      Passed - Valid encounter within last 12 months    Recent Outpatient Visits           10 months ago Type 2 diabetes mellitus with hyperglycemia, without  long-term current use of insulin [facility])   [place], [place], [place]   1 year ago Chronic left-sided low back pain with left-sided sciatica   [place] Gildardo Pounds, NP   1 year ago Dermatitis   [place] [facility] [facility], [place], [place]   1 year ago Chronic left-sided low back pain with left-sided sciatica   [place] Gildardo Pounds, NP   2 years ago Elevated TSH   [place] Gildardo Pounds, NP       Future Appointments             In 1 month Gildardo Pounds, NP [place]             meloxicam (MOBIC) 7.5 MG tablet 90 tablet 0    Sig: Take 1 tablet (7.5 mg total) by mouth daily.     Analgesics:  COX2 Inhibitors Failed - 08/30/2022  1:28 PM      Failed - Manual Review: Labs are only required if the patient has taken medication for more than 8 weeks.      Passed - HGB in normal range and within 360 days    Hemoglobin  Date Value Ref Range Status  10/11/2021 14.1 11.1 - 15.9 g/dL Final         Passed - Cr in normal range and within 360 days    Creat  Date Value Ref Range Status  07/06/2015 0.64 0.50 - 1.10 mg/dL Final   Creatinine, Ser  Date Value Ref Range Status  10/11/2021 0.65 0.57 - 1.00 mg/dL Final         Passed - HCT in normal range and within 360 days    Hematocrit  Date Value Ref Range Status  10/11/2021 42.5 34.0 - 46.6 %  Final         Passed - AST in normal range and within 360 days    AST  Date Value Ref Range Status  10/11/2021 20 0 - 40 IU/L Final         Passed - ALT in normal range and within 360 days    ALT  Date Value Ref Range Status  10/11/2021 23 0 - 32 IU/L Final         Passed - eGFR is 30 or above and within 360 days    GFR calc Af Amer  Date Value Ref Range Status  08/02/2020 125 >59 mL/min/1.73 Final     Comment:    **In accordance with recommendations from the NKF-ASN Task force,**   [facility] is in the process of updating its eGFR calculation to the   2021 CKD-EPI creatinine equation that estimates kidney function   without a race variable.    GFR, Estimated  Date Value Ref Range Status  11/16/2020 >60 >60 mL/min Final    Comment:    (NOTE) Calculated using the CKD-EPI Creatinine Equation (2021)    eGFR  Date Value Ref Range Status  10/11/2021 107 >59 mL/min/1.73 Final         Passed - Patient is not pregnant      Passed - Valid encounter within last 12 months    Recent Outpatient Visits           10 months ago Type 2 diabetes mellitus with hyperglycemia, without long-term current use of insulin [facility])   [place], [place], [place]   1 year ago Chronic left-sided low back pain with left-sided sciatica   [place] Gildardo Pounds, NP   1 year ago Dermatitis   [place] [facility], [place], [place]   1 year ago Chronic left-sided low back pain with left-sided sciatica   [place], Vernia Buff, NP   2 years ago Elevated TSH   [place] Gildardo Pounds, NP       Future Appointments             In 1 month Gildardo Pounds, NP [place]

## 2022-08-30 NOTE — Telephone Encounter (Signed)
Medication Refill - Medication: metFORMIN (GLUCOPHAGE) 500 MG tablet   omeprazole (PRILOSEC) 20 MG capsule   meloxicam (MOBIC) 7.5 MG tablet   Has the patient contacted their pharmacy? Yes.   (Agent: If no, request that the patient contact the pharmacy for the refill. If patient does not wish to contact the pharmacy document the reason why and proceed with request.) (Agent: If yes, when and what did the pharmacy advise?)  Preferred Pharmacy (with phone number or street name):  [facility] ([address] [place] [address]  [address] [place] ([place]) [place] [address]  Phone: [phone] Fax: [phone]  Hours: Not open 24 hours   Has the patient been seen for an appointment in the last year OR does the patient have an upcoming appointment? Yes.    Agent: Please be advised that RX refills may take up to 3 business days. We ask that you follow-up with your pharmacy.

## 2022-10-15 ENCOUNTER — Ambulatory Visit: Payer: Self-pay | Attending: Nurse Practitioner | Admitting: Nurse Practitioner

## 2022-10-15 ENCOUNTER — Encounter: Payer: Self-pay | Admitting: Nurse Practitioner

## 2022-10-15 VITALS — BP 107/69 | HR 77 | Ht 62.0 in | Wt 158.6 lb

## 2022-10-15 DIAGNOSIS — Z1211 Encounter for screening for malignant neoplasm of colon: Secondary | ICD-10-CM

## 2022-10-15 DIAGNOSIS — K219 Gastro-esophageal reflux disease without esophagitis: Secondary | ICD-10-CM

## 2022-10-15 DIAGNOSIS — K5909 Other constipation: Secondary | ICD-10-CM

## 2022-10-15 DIAGNOSIS — E1165 Type 2 diabetes mellitus with hyperglycemia: Secondary | ICD-10-CM

## 2022-10-15 DIAGNOSIS — G8929 Other chronic pain: Secondary | ICD-10-CM

## 2022-10-15 DIAGNOSIS — Z862 Personal history of diseases of the blood and blood-forming organs and certain disorders involving the immune mechanism: Secondary | ICD-10-CM

## 2022-10-15 DIAGNOSIS — E785 Hyperlipidemia, unspecified: Secondary | ICD-10-CM

## 2022-10-15 DIAGNOSIS — M5442 Lumbago with sciatica, left side: Secondary | ICD-10-CM

## 2022-10-15 MED ORDER — SENNOSIDES-DOCUSATE SODIUM 8.6-50 MG PO TABS: 2.0000 | ORAL_TABLET | Freq: Every day | ORAL | 3 refills | Status: DC

## 2022-10-15 MED ORDER — HYDROCORTISONE (PERIANAL) 2.5 % EX CREA: 1.0000 | TOPICAL_CREAM | Freq: Two times a day (BID) | CUTANEOUS | 1 refills | Status: DC

## 2022-10-15 MED ORDER — ATORVASTATIN CALCIUM 40 MG PO TABS: 40.0000 mg | ORAL_TABLET | Freq: Every day | ORAL | 1 refills | Status: DC

## 2022-10-15 MED ORDER — OMEPRAZOLE 20 MG PO CPDR: 20.0000 mg | DELAYED_RELEASE_CAPSULE | Freq: Every day | ORAL | 1 refills | Status: DC

## 2022-10-15 MED ORDER — POLYETHYLENE GLYCOL 3350 17 GM/SCOOP PO POWD: 17.0000 g | Freq: Every day | ORAL | 1 refills | Status: DC

## 2022-10-15 MED ORDER — MELOXICAM 7.5 MG PO TABS: 7.5000 mg | ORAL_TABLET | Freq: Every day | ORAL | 1 refills | Status: DC

## 2022-10-15 MED ORDER — METFORMIN HCL 500 MG PO TABS: ORAL_TABLET | ORAL | 1 refills | Status: DC

## 2022-10-15 NOTE — Progress Notes (Signed)
Rectal itching  Blood when wiping rectum after a bowel movement. Noticed one month ago.   Hip pain started a year ago.

## 2022-10-15 NOTE — Progress Notes (Signed)
Assessment & Plan:  Cheryl Patrick was seen today for diabetes.  Diagnoses and all orders for this visit:  Type 2 diabetes mellitus with hyperglycemia, without long-term current use of insulin (HCC) -     Hemoglobin A1c -     CMP14+EGFR -     metFORMIN (GLUCOPHAGE) 500 MG tablet; 2 in the morning with food and 1 in the evening with food Continue blood sugar control as discussed in office today, low carbohydrate diet, and regular physical exercise as tolerated, 150 minutes per week (30 min each day, 5 days per week, or 50 min 3 days per week). Keep blood sugar logs with fasting goal of 90-130 mg/dl, post prandial (after you eat) less than 180.  For Hypoglycemia: BS <60 and Hyperglycemia BS >400; contact the clinic ASAP. Annual eye exams and foot exams are recommended.   Colon cancer screening -     Cancel: Fecal occult blood, imunochemical([facility])  Dyslipidemia, goal LDL below 70 -     Lipid panel -     atorvastatin (LIPITOR) 40 MG tablet; Take 1 tablet (40 mg total) by mouth daily. INSTRUCTIONS: Work on a low fat, heart healthy diet and participate in regular aerobic exercise program by working out at least 150 minutes per week; 5 days a week-30 minutes per day. Avoid red meat/beef/steak,  fried foods. junk foods, sodas, sugary drinks, unhealthy snacking, alcohol and smoking.  Drink at least 80 oz of water per day and monitor your carbohydrate intake daily.    History of anemia -     CBC with Differential  Chronic left-sided low back pain with left-sided sciatica -     meloxicam (MOBIC) 7.5 MG tablet; Take 1 tablet (7.5 mg total) by mouth daily.  Gastroesophageal reflux disease without esophagitis -     omeprazole (PRILOSEC) 20 MG capsule; Take 1 capsule (20 mg total) by mouth daily. INSTRUCTIONS: Avoid GERD Triggers: acidic, spicy or fried foods, caffeine, coffee, sodas,  alcohol and chocolate.    Chronic constipation -     hydrocortisone (ANUSOL-HC) 2.5 % rectal cream; Place 1  Application rectally 2 (two) times daily. -     polyethylene glycol powder (GLYCOLAX/MIRALAX) 17 GM/SCOOP powder; Take 17 g by mouth daily. Mix in 8oz of water, juice, coffee or tea -     senna-docusate (SENOKOT-S) 8.6-50 MG tablet; Take 2 tablets by mouth daily. Increase water intake 64-80oz of water per day.     Patient has been counseled on age-appropriate routine health concerns for screening and prevention. These are reviewed and up-to-date. Referrals have been placed accordingly. Immunizations are up-to-date or declined.    Subjective:   Chief Complaint  Patient presents with   Diabetes   HPI Cheryl Patrick 52 y.o. female presents to office today for follow up to diabetes and with hemorrhoidal pain and constipation.   DM 2 Well controlled with metformin 1000 mg in the am and 500 mg in the pm. Lab Results  Component Value Date   HGBA1C 6.7 (A) 10/11/2021    Lab Results  Component Value Date   HGBA1C 6.7 (A) 10/11/2021    Blood pressure is well controlled.  BP Readings from Last 3 Encounters:  10/15/22 107/69  06/25/22 128/73  10/11/21 131/77     She has a history of hemorrhoids and constipation. Hemorrhoids onset 26 years ago after the birth of her daughter. Endorses blood in tissue when she wipes, hard painful stools and rectal itching and irritation.    Back Pain She is  currently taking meloxicam 7.5 mg daily and at times BID for her chronic lower back pain and left hip pain. Meloxicam has been somewhat effective. She was initially evaluated by me on 02-06-2021 with complaints at that time of 3 month onset of pain radiating from the lower back into the left hip and left leg . Initial inciting event: none. Symptoms are worst: all day. Alleviating factors identifiable by patient are none. Exacerbating factors identifiable by patient are sitting, standing, and walking. Treatments so far initiated by patient:  OTC Tylenol  Previous lower back problems: none. Previous workup:  none. Previous treatments:  toradol and prednisone both of which were ineffective. She denies any injury or trauma. Not work related injury. Denies any symptoms of involuntary loss of urine or stool      Review of Systems  Constitutional:  Negative for fever, malaise/fatigue and weight loss.  HENT: Negative.  Negative for nosebleeds.   Eyes: Negative.  Negative for blurred vision, double vision and photophobia.  Respiratory: Negative.  Negative for cough and shortness of breath.   Cardiovascular: Negative.  Negative for chest pain, palpitations and leg swelling.  Gastrointestinal:  Positive for blood in stool, constipation and heartburn. Negative for abdominal pain, diarrhea, melena, nausea and vomiting.  Musculoskeletal:  Positive for back pain and joint pain. Negative for myalgias.  Neurological: Negative.  Negative for dizziness, focal weakness, seizures and headaches.  Psychiatric/Behavioral: Negative.  Negative for suicidal ideas.     Past Medical History:  Diagnosis Date   Anxiety    Diabetes mellitus without complication ([place])    Hyperlipemia     Past Surgical History:  Procedure Laterality Date   DILATION AND EVACUATION N/A 05/10/2017   Procedure: DILATATION AND EVACUATION;  Surgeon: Aletha Halim, MD;  Location: [facility];  Service: Gynecology;  Laterality: N/A;   NO PAST SURGERIES      Family History  Problem Relation Age of Onset   Lymphoma Mother    Hypertension Mother    Diabetes Mother    Diabetes Father    Hypertension Father    Breast cancer Neg Hx     Social History Reviewed with no changes to be made today.   Outpatient Medications Prior to Visit  Medication Sig Dispense Refill   meloxicam (MOBIC) 7.5 MG tablet Take 1 tablet (7.5 mg total) by mouth daily. 90 tablet 0   metFORMIN (GLUCOPHAGE) 500 MG tablet 2 in the morning with food and 1 in the evening with food 270 tablet 0   omeprazole (PRILOSEC) 20 MG capsule Take 1 capsule (20 mg total) by mouth  daily. 90 capsule 0   acetaminophen (TYLENOL) 500 MG tablet Take 500 mg by mouth every 6 (six) hours as needed for moderate pain. (Patient not taking: Reported on 10/15/2022)     atorvastatin (LIPITOR) 40 MG tablet Take 1 tablet (40 mg total) by mouth daily. (Patient not taking: Reported on 10/15/2022) 90 tablet 1   No facility-administered medications prior to visit.    No Known Allergies     Objective:    BP 107/69   Pulse 77   Ht 5\' 2"  (1.575 m)   Wt 158 lb 9.6 oz (71.9 kg)   LMP 03/31/2021 (Exact Date)   SpO2 97%   BMI 29.01 kg/m  Wt Readings from Last 3 Encounters:  10/15/22 158 lb 9.6 oz (71.9 kg)  06/25/22 156 lb (70.8 kg)  10/11/21 163 lb 4 oz (74 kg)    Physical Exam Vitals and nursing note  reviewed.  Constitutional:      Appearance: She is well-developed.  HENT:     Head: Normocephalic and atraumatic.  Cardiovascular:     Rate and Rhythm: Normal rate and regular rhythm.     Heart sounds: Normal heart sounds. No murmur heard.    No friction rub. No gallop.  Pulmonary:     Effort: Pulmonary effort is normal. No tachypnea or respiratory distress.     Breath sounds: Normal breath sounds. No decreased breath sounds, wheezing, rhonchi or rales.  Chest:     Chest wall: No tenderness.  Abdominal:     General: Bowel sounds are normal.     Palpations: Abdomen is soft.  Musculoskeletal:        General: Normal range of motion.     Cervical back: Normal range of motion.  Skin:    General: Skin is warm and dry.  Neurological:     Mental Status: She is alert and oriented to person, place, and time.     Coordination: Coordination normal.  Psychiatric:        Behavior: Behavior normal. Behavior is cooperative.        Thought Content: Thought content normal.        Judgment: Judgment normal.          Patient has been counseled extensively about nutrition and exercise as well as the importance of adherence with medications and regular follow-up. The patient was  given clear instructions to go to ER or return to medical center if symptoms don't improve, worsen or new problems develop. The patient verbalized understanding.   Follow-up: Return in about 8 weeks (around 12/10/2022) for back and hip pain.   Gildardo Pounds, FNP-BC [facility] and [facility] [place], [place]   10/15/2022, 5:18 PM

## 2022-10-21 ENCOUNTER — Ambulatory Visit: Payer: Self-pay | Attending: Nurse Practitioner

## 2022-10-22 LAB — CBC WITH DIFFERENTIAL/PLATELET
Basophils Absolute: 0 10*3/uL (ref 0.0–0.2)
Basos: 1 %
EOS (ABSOLUTE): 0.1 10*3/uL (ref 0.0–0.4)
Eos: 2 %
Hematocrit: 40.5 % (ref 34.0–46.6)
Hemoglobin: 13.7 g/dL (ref 11.1–15.9)
Immature Grans (Abs): 0 10*3/uL (ref 0.0–0.1)
Immature Granulocytes: 0 %
Lymphocytes Absolute: 2.2 10*3/uL (ref 0.7–3.1)
Lymphs: 43 %
MCH: 27.8 pg (ref 26.6–33.0)
MCHC: 33.8 g/dL (ref 31.5–35.7)
MCV: 82 fL (ref 79–97)
Monocytes Absolute: 0.3 10*3/uL (ref 0.1–0.9)
Monocytes: 6 %
Neutrophils Absolute: 2.5 10*3/uL (ref 1.4–7.0)
Neutrophils: 48 %
Platelets: 197 10*3/uL (ref 150–450)
RBC: 4.93 x10E6/uL (ref 3.77–5.28)
RDW: 13.7 % (ref 11.7–15.4)
WBC: 5.2 10*3/uL (ref 3.4–10.8)

## 2022-10-22 LAB — LIPID PANEL
Chol/HDL Ratio: 4.4 ratio (ref 0.0–4.4)
Cholesterol, Total: 181 mg/dL (ref 100–199)
HDL: 41 mg/dL (ref >39)
LDL Chol Calc (NIH): 111 mg/dL — ABNORMAL HIGH (ref 0–99)
Triglycerides: 162 mg/dL — ABNORMAL HIGH (ref 0–149)
VLDL Cholesterol Cal: 29 mg/dL (ref 5–40)

## 2022-10-22 LAB — CMP14+EGFR
ALT: 19 IU/L (ref 0–32)
AST: 17 IU/L (ref 0–40)
Albumin/Globulin Ratio: 2 (ref 1.2–2.2)
Albumin: 4.2 g/dL (ref 3.8–4.9)
Alkaline Phosphatase: 99 IU/L (ref 44–121)
BUN/Creatinine Ratio: 39 — ABNORMAL HIGH (ref 9–23)
BUN: 27 mg/dL — ABNORMAL HIGH (ref 6–24)
Bilirubin Total: 0.3 mg/dL (ref 0.0–1.2)
CO2: 20 mmol/L (ref 20–29)
Calcium: 9.1 mg/dL (ref 8.7–10.2)
Chloride: 106 mmol/L (ref 96–106)
Creatinine, Ser: 0.69 mg/dL (ref 0.57–1.00)
Globulin, Total: 2.1 g/dL (ref 1.5–4.5)
Glucose: 130 mg/dL — ABNORMAL HIGH (ref 70–99)
Potassium: 4.1 mmol/L (ref 3.5–5.2)
Sodium: 141 mmol/L (ref 134–144)
Total Protein: 6.3 g/dL (ref 6.0–8.5)
eGFR: 105 mL/min/{1.73_m2} (ref >59)

## 2022-10-22 LAB — HEMOGLOBIN A1C
Est. average glucose Bld gHb Est-mCnc: 131 mg/dL
Hgb A1c MFr Bld: 6.2 % — ABNORMAL HIGH (ref 4.8–5.6)

## 2022-12-10 ENCOUNTER — Ambulatory Visit: Payer: Self-pay | Attending: Nurse Practitioner | Admitting: Nurse Practitioner

## 2022-12-10 ENCOUNTER — Encounter: Payer: Self-pay | Admitting: Nurse Practitioner

## 2022-12-10 VITALS — BP 127/79 | HR 78 | Ht 62.0 in | Wt 161.4 lb

## 2022-12-10 DIAGNOSIS — M25571 Pain in right ankle and joints of right foot: Secondary | ICD-10-CM

## 2022-12-10 MED ORDER — TRAMADOL HCL 50 MG PO TABS: 50.0000 mg | ORAL_TABLET | Freq: Three times a day (TID) | ORAL | 0 refills | Status: AC | PRN

## 2022-12-10 NOTE — Progress Notes (Signed)
Right ankle injury on 5/11. Back and hip pain has decreased a little.

## 2022-12-10 NOTE — Progress Notes (Signed)
Assessment & Plan:  Cheryl Patrick was seen today for ankle injury, back pain and hip pain.  Diagnoses and all orders for this visit:  Acute right ankle pain -     traMADol (ULTRAM) 50 MG tablet; Take 1 tablet (50 mg total) by mouth every 8 (eight) hours as needed for up to 5 days.    Patient has been counseled on age-appropriate routine health concerns for screening and prevention. These are reviewed and up-to-date. Referrals have been placed accordingly. Immunizations are up-to-date or declined.    Subjective:   Chief Complaint  Patient presents with   Ankle Injury   Back Pain   Hip Pain   HPI Cheryl Patrick 52 y.o. female presents to office today for follow up for right ankle pain.   VRI was used to communicate directly with patient for the entire encounter including providing detailed patient instructions.     Ankle Pain: Patient complains of injury to the right ankle. This is evaluated as a personal injury. The injury occurred 3 days ago, and occurred while walking outside and stepping into a hole.  The patient states the ankle rolled inward at the time of injury.  She did not hear or sense a pop or snap at the time of the injury. The patient notes pain and moderate swelling of the ankle since the injury. She has treated the ankle with no therapy. Pain is localized to the anterolateral malleolar area. She has not sprained this ankle in the past.    Review of Systems  Constitutional:  Negative for fever, malaise/fatigue and weight loss.  HENT: Negative.  Negative for nosebleeds.   Eyes: Negative.  Negative for blurred vision, double vision and photophobia.  Respiratory: Negative.  Negative for cough and shortness of breath.   Cardiovascular: Negative.  Negative for chest pain, palpitations and leg swelling.  Gastrointestinal: Negative.  Negative for heartburn, nausea and vomiting.  Musculoskeletal:  Positive for joint pain. Negative for myalgias.  Neurological: Negative.  Negative  for dizziness, focal weakness, seizures and headaches.  Psychiatric/Behavioral: Negative.  Negative for suicidal ideas.     Past Medical History:  Diagnosis Date   Anxiety    Diabetes mellitus without complication (HCC)    Hyperlipemia     Past Surgical History:  Procedure Laterality Date   DILATION AND EVACUATION N/A 05/10/2017   Procedure: DILATATION AND EVACUATION;  Surgeon: [place] Bing, MD;  Location: [facility];  Service: Gynecology;  Laterality: N/A;   NO PAST SURGERIES      Family History  Problem Relation Age of Onset   Lymphoma Mother    Hypertension Mother    Diabetes Mother    Diabetes Father    Hypertension Father    Breast cancer Neg Hx     Social History Reviewed with no changes to be made today.   Outpatient Medications Prior to Visit  Medication Sig Dispense Refill   atorvastatin (LIPITOR) 40 MG tablet Take 1 tablet (40 mg total) by mouth daily. 90 tablet 1   hydrocortisone (ANUSOL-HC) 2.5 % rectal cream Place 1 Application rectally 2 (two) times daily. 60 g 1   meloxicam (MOBIC) 7.5 MG tablet Take 1 tablet (7.5 mg total) by mouth daily. 90 tablet 1   metFORMIN (GLUCOPHAGE) 500 MG tablet 2 in the morning with food and 1 in the evening with food 270 tablet 1   omeprazole (PRILOSEC) 20 MG capsule Take 1 capsule (20 mg total) by mouth daily. 90 capsule 1   polyethylene glycol  powder (GLYCOLAX/MIRALAX) 17 GM/SCOOP powder Take 17 g by mouth daily. Mix in 8oz of water, juice, coffee or tea 3350 g 1   senna-docusate (SENOKOT-S) 8.6-50 MG tablet Take 2 tablets by mouth daily. 60 tablet 3   acetaminophen (TYLENOL) 500 MG tablet Take 500 mg by mouth every 6 (six) hours as needed for moderate pain. (Patient not taking: Reported on 10/15/2022)     No facility-administered medications prior to visit.    No Known Allergies     Objective:    BP 127/79 (BP Location: Left Arm, Patient Position: Sitting, Cuff Size: Normal)   Pulse 78   Ht 5\' 2"  (1.575 m)   Wt 161 lb  6.4 oz (73.2 kg)   LMP 03/31/2021 (Exact Date)   SpO2 98%   BMI 29.52 kg/m  Wt Readings from Last 3 Encounters:  12/10/22 161 lb 6.4 oz (73.2 kg)  10/15/22 158 lb 9.6 oz (71.9 kg)  06/25/22 156 lb (70.8 kg)    Physical Exam Vitals and nursing note reviewed.  Constitutional:      Appearance: She is well-developed.  HENT:     Head: Normocephalic and atraumatic.  Cardiovascular:     Rate and Rhythm: Normal rate and regular rhythm.     Heart sounds: Normal heart sounds. No murmur heard.    No friction rub. No gallop.  Pulmonary:     Effort: Pulmonary effort is normal. No tachypnea or respiratory distress.     Breath sounds: Normal breath sounds. No decreased breath sounds, wheezing, rhonchi or rales.  Chest:     Chest wall: No tenderness.  Abdominal:     General: Bowel sounds are normal.     Palpations: Abdomen is soft.  Musculoskeletal:        General: Normal range of motion.     Cervical back: Normal range of motion.     Right ankle: Swelling present. Tenderness present over the lateral malleolus.  Skin:    General: Skin is warm and dry.  Neurological:     Mental Status: She is alert and oriented to person, place, and time.     Coordination: Coordination normal.  Psychiatric:        Behavior: Behavior normal. Behavior is cooperative.        Thought Content: Thought content normal.        Judgment: Judgment normal.          Patient has been counseled extensively about nutrition and exercise as well as the importance of adherence with medications and regular follow-up. The patient was given clear instructions to go to ER or return to medical center if symptoms don't improve, worsen or new problems develop. The patient verbalized understanding.   Follow-up: Return for next thursday at 1050 right ankle pain.   Claiborne Rigg, FNP-BC [facility] and [facility] [place], [place] [phone]   12/10/2022, 5:09 PM

## 2022-12-18 IMAGING — MG MM DIGITAL SCREENING BILAT W/ TOMO AND CAD
8 series · 8 of 24 positions shown · non-contrast
Comparison: Previous exam(s).

CLINICAL DATA: Screening.

EXAM:
DIGITAL SCREENING BILATERAL MAMMOGRAM WITH TOMOSYNTHESIS AND CAD
TECHNIQUE: Bilateral screening digital craniocaudal and mediolateral oblique
mammograms were obtained. Bilateral screening digital breast
tomosynthesis was performed. The images were evaluated with
computer-aided detection.

[R MLO synth-2D]
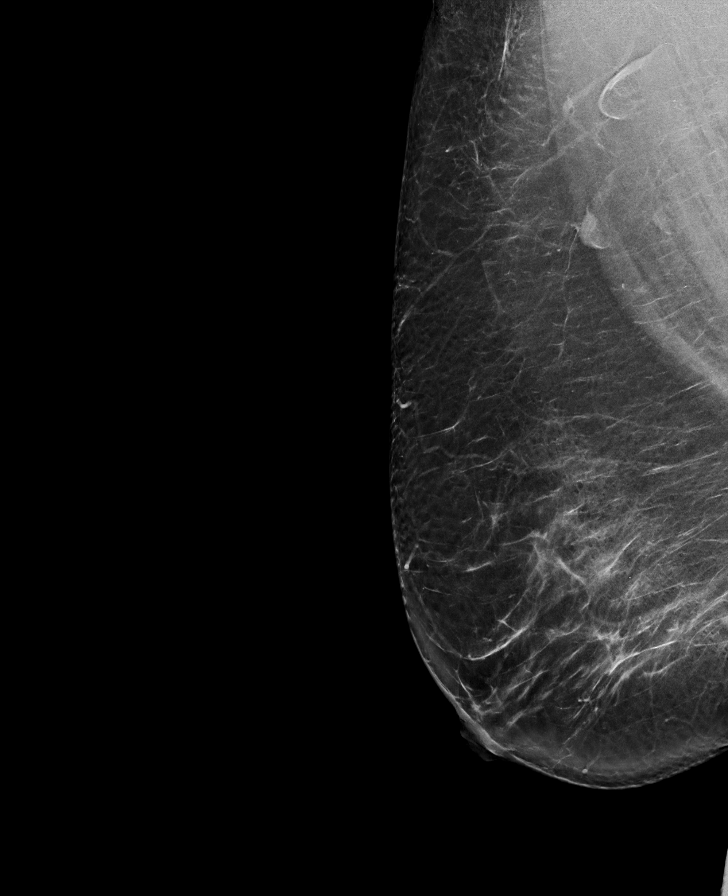

[R CC synth-2D]
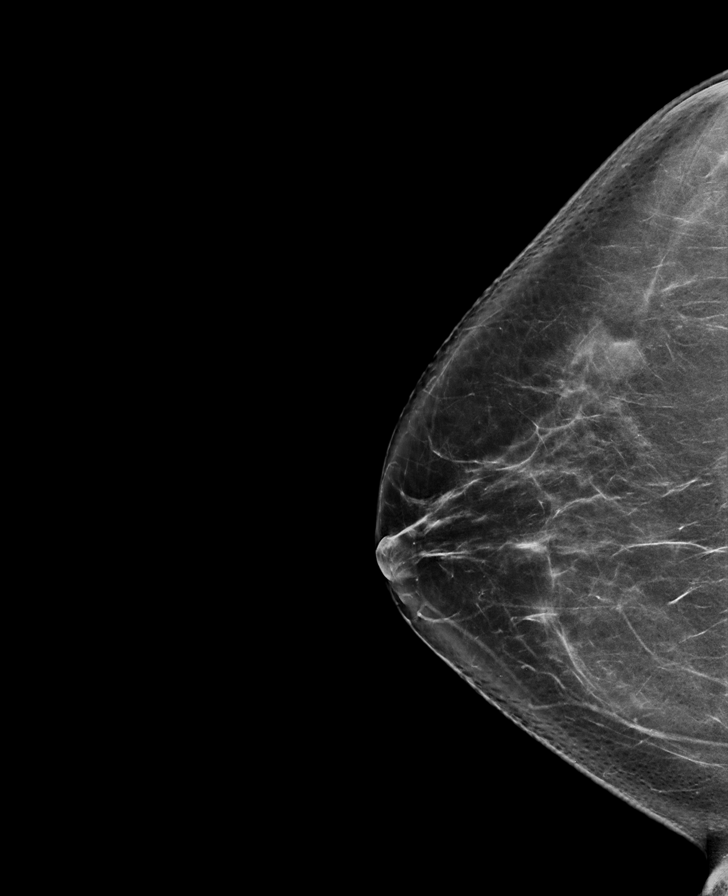

[L CC synth-2D]
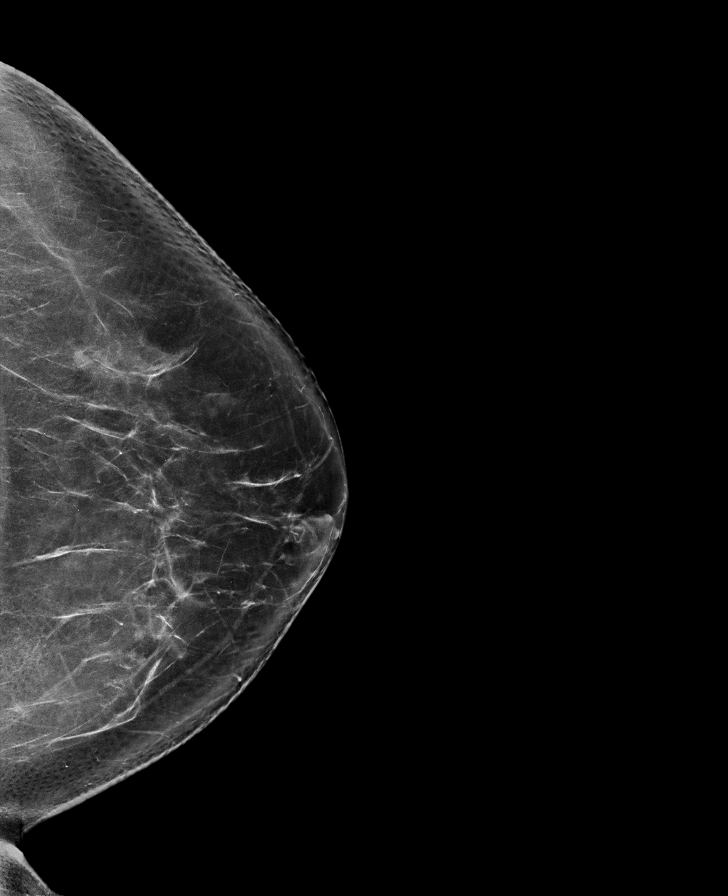

[L MLO synth-2D]
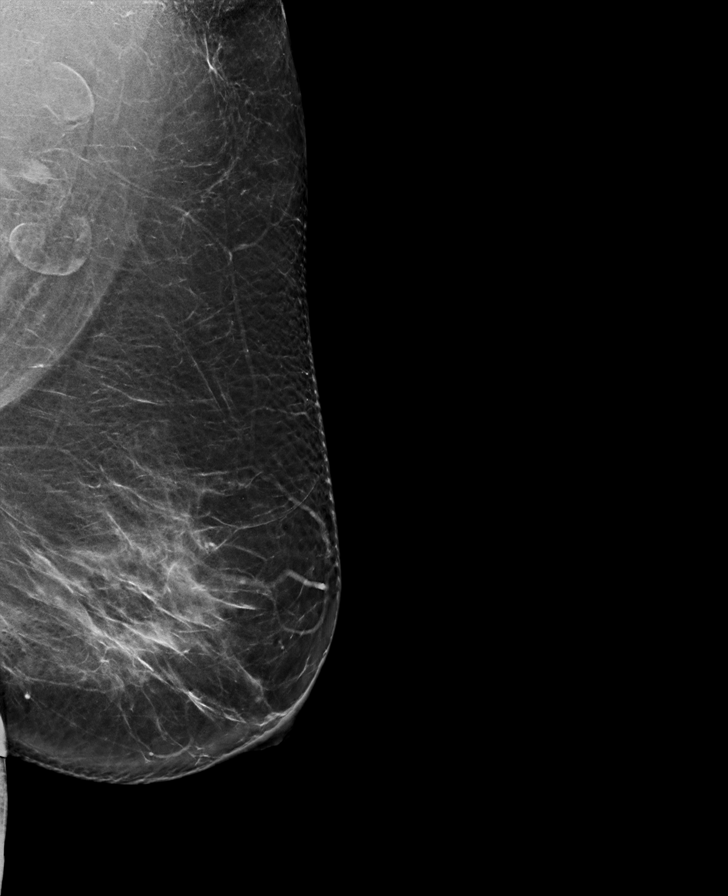

[L CC tomo · tomo slice 43/86.0]
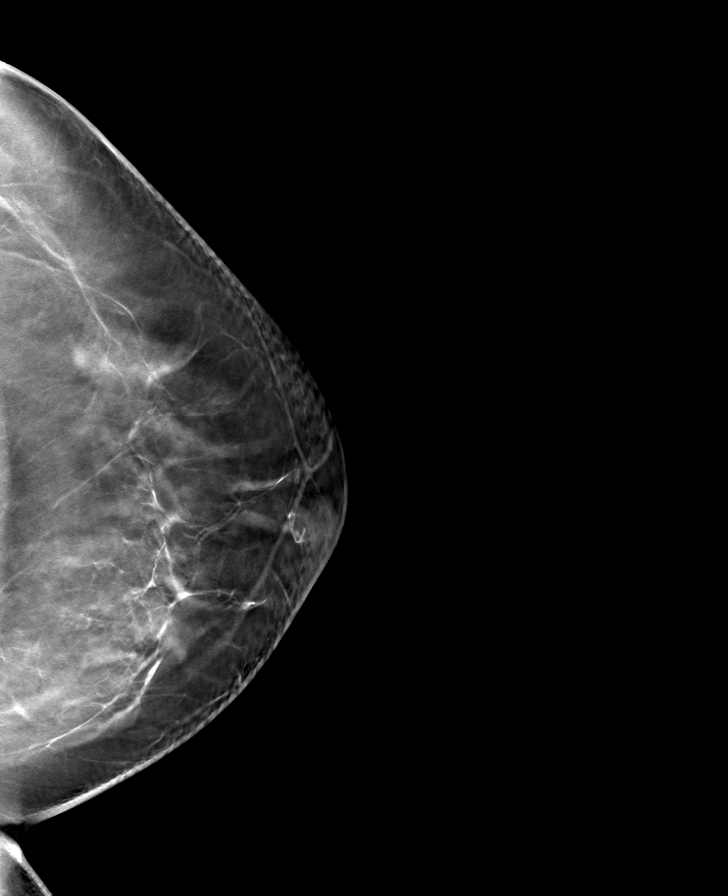

[L MLO tomo · tomo slice 45/88.0]
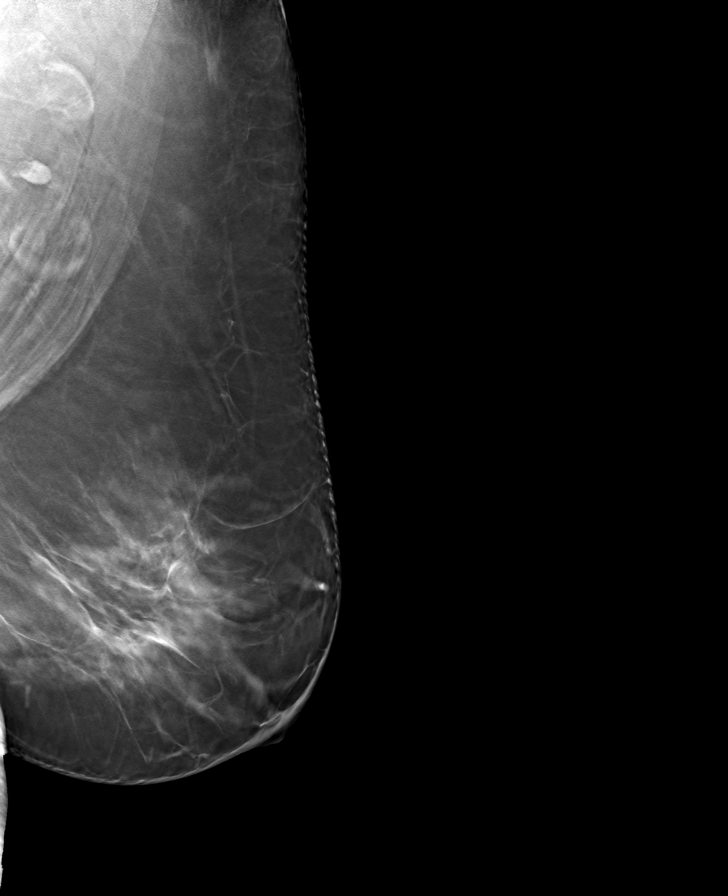

[R CC tomo · tomo slice 43/84.0]
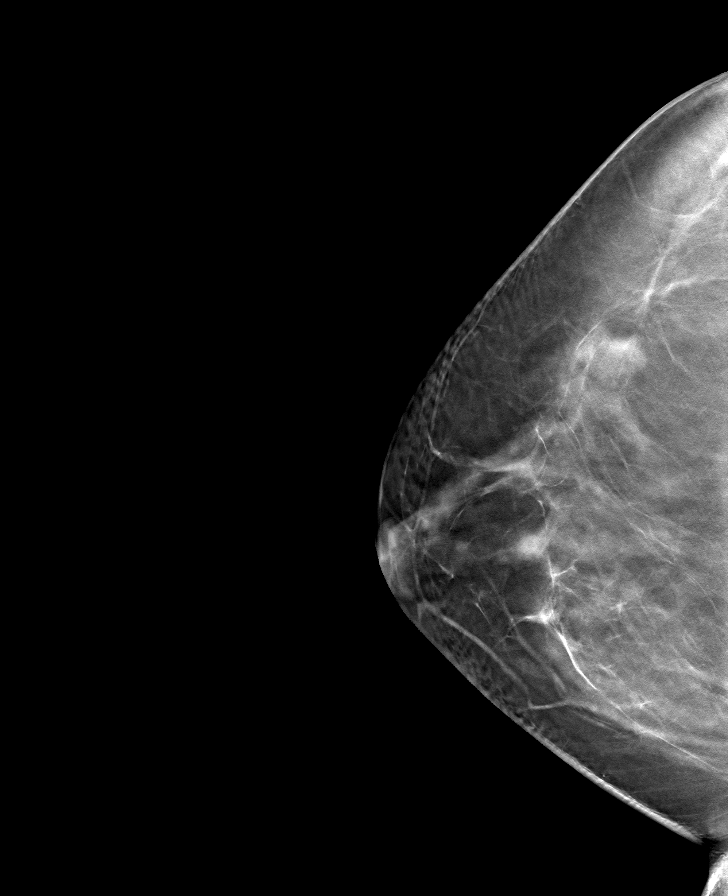

[R MLO tomo · tomo slice 44/87.0]
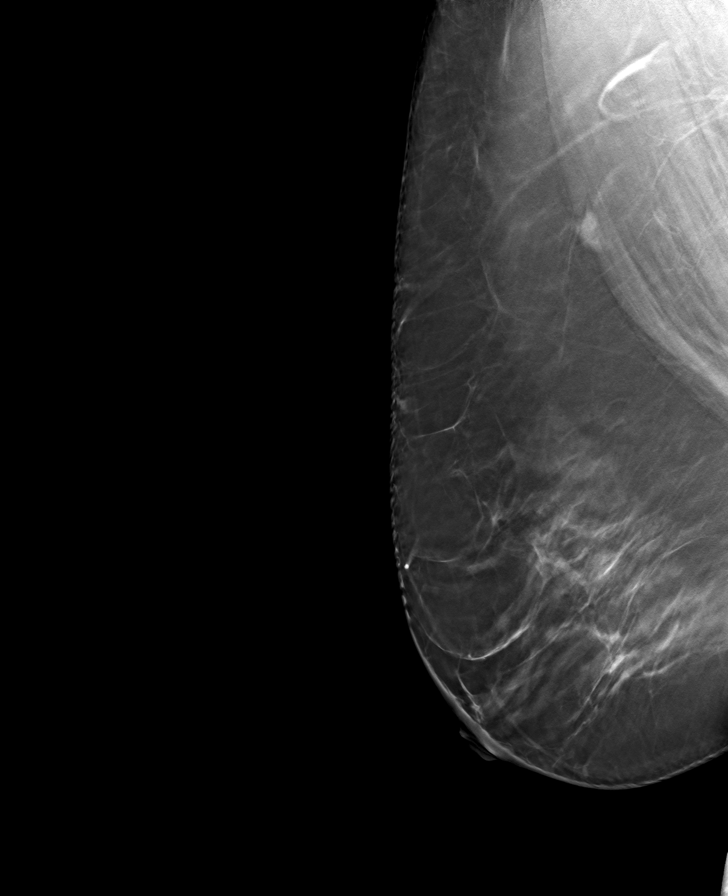

[8 of 24 positions shown; findings below may reference images not displayed]

ACR Breast Density Category b: There are scattered areas of
fibroglandular density.
FINDINGS: In the left breast, a possible asymmetry warrants further
evaluation. In the right breast, no findings suspicious for
malignancy.
IMPRESSION: Further evaluation is suggested for possible asymmetry in the left
breast.

RECOMMENDATION:
Diagnostic mammogram and possibly ultrasound of the left breast.
(Code:[id number])

The patient will be contacted regarding the findings, and additional
imaging will be scheduled.

BI-RADS CATEGORY  0: Incomplete. Need additional imaging evaluation
and/or prior mammograms for comparison.

## 2022-12-19 ENCOUNTER — Ambulatory Visit: Payer: Self-pay | Attending: Nurse Practitioner | Admitting: Nurse Practitioner

## 2022-12-19 ENCOUNTER — Encounter: Payer: Self-pay | Admitting: Nurse Practitioner

## 2022-12-19 VITALS — BP 120/70 | HR 62 | Ht 62.0 in | Wt 159.6 lb

## 2022-12-19 DIAGNOSIS — M25571 Pain in right ankle and joints of right foot: Secondary | ICD-10-CM

## 2022-12-19 NOTE — Progress Notes (Signed)
Cheryl Patrick [id number] 

## 2022-12-19 NOTE — Progress Notes (Signed)
Assessment & Plan:  Cheryl Patrick was seen today for ankle pain.  Diagnoses and all orders for this visit:  Acute right ankle pain Continue ACE wrap/compression -     DG Ankle Complete Right; Future    Patient has been counseled on age-appropriate routine health concerns for screening and prevention. These are reviewed and up-to-date. Referrals have been placed accordingly. Immunizations are up-to-date or declined.    Subjective:   Chief Complaint  Patient presents with   Ankle Pain   HPI Cheryl Patrick 52 y.o. female presents to office today for follow up for right ankle pain.    VRI was used to communicate directly with patient for the entire encounter including providing detailed patient instructions.       Ankle Pain: Patient complains of injury to the right ankle. This is evaluated as a personal injury. The injury occurred a few weeks ago, and occurred while walking outside and stepping into a hole.  The patient states the ankle rolled inward at the time of injury.  She did not hear or sense a pop or snap at the time of the injury. The patient notes pain and moderate swelling of the ankle since the injury. She has treated the ankle with no therapy. Pain is localized to the anterolateral malleolar area. She has not sprained this ankle in the past.    Today she endorses significant improvement in swelling and pain in the right lateral malleolar area. On exam there is minimal swelling and bruising   Review of Systems  Constitutional:  Negative for fever, malaise/fatigue and weight loss.  HENT: Negative.  Negative for nosebleeds.   Eyes: Negative.  Negative for blurred vision, double vision and photophobia.  Respiratory: Negative.  Negative for cough and shortness of breath.   Cardiovascular: Negative.  Negative for chest pain, palpitations and leg swelling.  Gastrointestinal: Negative.  Negative for heartburn, nausea and vomiting.  Musculoskeletal:  Positive for joint pain.  Negative for myalgias.  Neurological: Negative.  Negative for dizziness, focal weakness, seizures and headaches.  Psychiatric/Behavioral: Negative.  Negative for suicidal ideas.     Past Medical History:  Diagnosis Date   Anxiety    Diabetes mellitus without complication (HCC)    Hyperlipemia     Past Surgical History:  Procedure Laterality Date   DILATION AND EVACUATION N/A 05/10/2017   Procedure: DILATATION AND EVACUATION;  Surgeon: [place] Bing, MD;  Location: [facility];  Service: Gynecology;  Laterality: N/A;   NO PAST SURGERIES      Family History  Problem Relation Age of Onset   Lymphoma Mother    Hypertension Mother    Diabetes Mother    Diabetes Father    Hypertension Father    Breast cancer Neg Hx     Social History Reviewed with no changes to be made today.   Outpatient Medications Prior to Visit  Medication Sig Dispense Refill   acetaminophen (TYLENOL) 500 MG tablet Take 500 mg by mouth every 6 (six) hours as needed for moderate pain.     atorvastatin (LIPITOR) 40 MG tablet Take 1 tablet (40 mg total) by mouth daily. 90 tablet 1   hydrocortisone (ANUSOL-HC) 2.5 % rectal cream Place 1 Application rectally 2 (two) times daily. 60 g 1   meloxicam (MOBIC) 7.5 MG tablet Take 1 tablet (7.5 mg total) by mouth daily. 90 tablet 1   metFORMIN (GLUCOPHAGE) 500 MG tablet 2 in the morning with food and 1 in the evening with food 270 tablet 1  omeprazole (PRILOSEC) 20 MG capsule Take 1 capsule (20 mg total) by mouth daily. 90 capsule 1   polyethylene glycol powder (GLYCOLAX/MIRALAX) 17 GM/SCOOP powder Take 17 g by mouth daily. Mix in 8oz of water, juice, coffee or tea 3350 g 1   senna-docusate (SENOKOT-S) 8.6-50 MG tablet Take 2 tablets by mouth daily. 60 tablet 3   No facility-administered medications prior to visit.    No Known Allergies     Objective:    BP 120/70 (BP Location: Left Arm, Patient Position: Sitting, Cuff Size: Normal)   Pulse 62   Ht 5\' 2"  (1.575  m)   Wt 159 lb 9.6 oz (72.4 kg)   LMP 03/31/2021 (Exact Date)   SpO2 99%   BMI 29.19 kg/m  Wt Readings from Last 3 Encounters:  12/19/22 159 lb 9.6 oz (72.4 kg)  12/10/22 161 lb 6.4 oz (73.2 kg)  10/15/22 158 lb 9.6 oz (71.9 kg)    Physical Exam Vitals and nursing note reviewed.  Constitutional:      Appearance: She is well-developed.  HENT:     Head: Normocephalic and atraumatic.  Cardiovascular:     Rate and Rhythm: Normal rate and regular rhythm.     Heart sounds: Normal heart sounds. No murmur heard.    No friction rub. No gallop.  Pulmonary:     Effort: Pulmonary effort is normal. No tachypnea or respiratory distress.     Breath sounds: Normal breath sounds. No decreased breath sounds, wheezing, rhonchi or rales.  Chest:     Chest wall: No tenderness.  Abdominal:     General: Bowel sounds are normal.     Palpations: Abdomen is soft.  Musculoskeletal:        General: Normal range of motion.     Cervical back: Normal range of motion.     Right ankle: Swelling (improved) present. Tenderness (minimal) present over the lateral malleolus.  Skin:    General: Skin is warm and dry.     Findings: Bruising present.  Neurological:     Mental Status: She is alert and oriented to person, place, and time.     Coordination: Coordination normal.  Psychiatric:        Behavior: Behavior normal. Behavior is cooperative.        Thought Content: Thought content normal.        Judgment: Judgment normal.          Patient has been counseled extensively about nutrition and exercise as well as the importance of adherence with medications and regular follow-up. The patient was given clear instructions to go to ER or return to medical center if symptoms don't improve, worsen or new problems develop. The patient verbalized understanding.   Follow-up: Return in about 4 months (around 04/21/2023) for a1c.   Claiborne Rigg, FNP-BC [facility] and [facility] [place], [place] [phone]   12/19/2022, 1:14 PM

## 2023-02-19 ENCOUNTER — Other Ambulatory Visit (HOSPITAL_COMMUNITY): Payer: Self-pay

## 2023-02-28 ENCOUNTER — Other Ambulatory Visit: Payer: Self-pay | Admitting: Nurse Practitioner

## 2023-02-28 DIAGNOSIS — E1165 Type 2 diabetes mellitus with hyperglycemia: Secondary | ICD-10-CM

## 2023-02-28 DIAGNOSIS — E785 Hyperlipidemia, unspecified: Secondary | ICD-10-CM

## 2023-02-28 DIAGNOSIS — K219 Gastro-esophageal reflux disease without esophagitis: Secondary | ICD-10-CM

## 2023-02-28 DIAGNOSIS — G8929 Other chronic pain: Secondary | ICD-10-CM

## 2023-02-28 NOTE — Telephone Encounter (Signed)
Medication Refill - Medication: metFORMIN (GLUCOPHAGE) 500 MG tablet , meloxicam (MOBIC) 7.5 MG tablet , omeprazole (PRILOSEC) 20 MG capsule , atorvastatin (LIPITOR) 40 MG tablet   Has the patient contacted their pharmacy? No.   Preferred Pharmacy (with phone number or street name):  [facility] [address] [place]), [place] - [id number] Phone: [phone]  Fax: [phone]      Has the patient been seen for an appointment in the last year OR does the patient have an upcoming appointment? Yes.    Please assist patient further

## 2023-03-03 MED ORDER — ATORVASTATIN CALCIUM 40 MG PO TABS
40.0000 mg | ORAL_TABLET | Freq: Every day | ORAL | 1 refills | Status: DC
Start: 2023-03-03 — End: 2024-04-28

## 2023-03-03 MED ORDER — MELOXICAM 7.5 MG PO TABS
7.5000 mg | ORAL_TABLET | Freq: Every day | ORAL | 0 refills | Status: DC
Start: 2023-03-03 — End: 2023-07-29

## 2023-03-03 MED ORDER — METFORMIN HCL 500 MG PO TABS
ORAL_TABLET | ORAL | 1 refills | Status: DC
Start: 2023-03-03 — End: 2023-04-22

## 2023-03-03 MED ORDER — OMEPRAZOLE 20 MG PO CPDR
20.0000 mg | DELAYED_RELEASE_CAPSULE | Freq: Every day | ORAL | 1 refills | Status: DC
Start: 2023-03-03 — End: 2023-10-27

## 2023-03-03 NOTE — Telephone Encounter (Signed)
Requested medication (s) are due for refill today: Yes  Requested medication (s) are on the active medication list: Yes  Last refill:  10/15/22  Future visit scheduled: Yes  Notes to clinic:  Manual review.    Requested Prescriptions  Pending Prescriptions Disp Refills   meloxicam (MOBIC) 7.5 MG tablet 90 tablet 1    Sig: Take 1 tablet (7.5 mg total) by mouth daily.     Analgesics:  COX2 Inhibitors Failed - 02/28/2023  2:53 PM      Failed - Manual Review: Labs are only required if the patient has taken medication for more than 8 weeks.      Passed - HGB in normal range and within 360 days    Hemoglobin  Date Value Ref Range Status  10/21/2022 13.7 11.1 - 15.9 g/dL Final         Passed - Cr in normal range and within 360 days    Creat  Date Value Ref Range Status  07/06/2015 0.64 0.50 - 1.10 mg/dL Final   Creatinine, Ser  Date Value Ref Range Status  10/21/2022 0.69 0.57 - 1.00 mg/dL Final         Passed - HCT in normal range and within 360 days    Hematocrit  Date Value Ref Range Status  10/21/2022 40.5 34.0 - 46.6 % Final         Passed - AST in normal range and within 360 days    AST  Date Value Ref Range Status  10/21/2022 17 0 - 40 IU/L Final         Passed - ALT in normal range and within 360 days    ALT  Date Value Ref Range Status  10/21/2022 19 0 - 32 IU/L Final         Passed - eGFR is 30 or above and within 360 days    GFR calc Af Amer  Date Value Ref Range Status  08/02/2020 125 >59 mL/min/1.73 Final    Comment:    **In accordance with recommendations from the NKF-ASN Task force,**   [facility] is in the process of updating its eGFR calculation to the   2021 CKD-EPI creatinine equation that estimates kidney function   without a race variable.    GFR, Estimated  Date Value Ref Range Status  11/16/2020 >60 >60 mL/min Final    Comment:    (NOTE) Calculated using the CKD-EPI Creatinine Equation (2021)    eGFR  Date Value Ref Range Status   10/21/2022 105 >59 mL/min/1.73 Final         Passed - Patient is not pregnant      Passed - Valid encounter within last 12 months    Recent Outpatient Visits           2 months ago Acute right ankle pain   [place] [place], [place] W, NP   2 months ago Acute right ankle pain   [facility] [place], [place] W, NP   4 months ago Type 2 diabetes mellitus with hyperglycemia, without long-term current use of insulin [facility])   [place] [facility] [place], [place] W, NP   1 year ago Type 2 diabetes mellitus with hyperglycemia, without long-term current use of insulin [facility])   [place] [facility] [place], [place], [place]   1 year ago Chronic left-sided low back pain with left-sided sciatica   [place]  [facility] [place], Shea Stakes, NP       Future Appointments             In 1 month Claiborne Rigg, NP [facility]            Signed Prescriptions Disp Refills   metFORMIN (GLUCOPHAGE) 500 MG tablet 270 tablet 1    Sig: 2 in the morning with food and 1 in the evening with food     Endocrinology:  Diabetes - Biguanides Failed - 02/28/2023  2:53 PM      Failed - B12 Level in normal range and within 720 days    No results found for: "VITAMINB12"       Passed - Cr in normal range and within 360 days    Creat  Date Value Ref Range Status  07/06/2015 0.64 0.50 - 1.10 mg/dL Final   Creatinine, Ser  Date Value Ref Range Status  10/21/2022 0.69 0.57 - 1.00 mg/dL Final         Passed - HBA1C is between 0 and 7.9 and within 180 days    Hgb A1c MFr Bld  Date Value Ref Range Status  10/21/2022 6.2 (H) 4.8 - 5.6 % Final    Comment:             Prediabetes: 5.7 - 6.4          Diabetes: >6.4          Glycemic control for adults with diabetes: <7.0          Passed - eGFR in normal range and within 360 days     GFR calc Af Amer  Date Value Ref Range Status  08/02/2020 125 >59 mL/min/1.73 Final    Comment:    **In accordance with recommendations from the NKF-ASN Task force,**   [facility] is in the process of updating its eGFR calculation to the   2021 CKD-EPI creatinine equation that estimates kidney function   without a race variable.    GFR, Estimated  Date Value Ref Range Status  11/16/2020 >60 >60 mL/min Final    Comment:    (NOTE) Calculated using the CKD-EPI Creatinine Equation (2021)    eGFR  Date Value Ref Range Status  10/21/2022 105 >59 mL/min/1.73 Final         Passed - Valid encounter within last 6 months    Recent Outpatient Visits           2 months ago Acute right ankle pain   [facility] [place], [place] W, NP   2 months ago Acute right ankle pain   [place] [facility] [place], [place] W, NP   4 months ago Type 2 diabetes mellitus with hyperglycemia, without long-term current use of insulin [facility])   [place] [facility] [place], [place] W, NP   1 year ago Type 2 diabetes mellitus with hyperglycemia, without long-term current use of insulin [facility])   [place] [facility] [place], [place], [place]   1 year ago Chronic left-sided low back pain with left-sided sciatica   [facility] Claiborne Rigg, NP       Future Appointments             In 1 month Claiborne Rigg, NP [facility]  Passed - CBC within normal limits and completed in the last 12 months    WBC  Date Value Ref Range Status  10/21/2022 5.2 3.4 - 10.8 x10E3/uL Final  11/16/2020 8.2 4.0 - 10.5 K/uL Final   RBC  Date Value Ref Range Status  10/21/2022 4.93 3.77 - 5.28 x10E6/uL Final  11/16/2020 4.70 3.87 - 5.11 MIL/uL Final   Hemoglobin  Date Value Ref Range Status  10/21/2022 13.7 11.1 - 15.9 g/dL Final    Hematocrit  Date Value Ref Range Status  10/21/2022 40.5 34.0 - 46.6 % Final   MCHC  Date Value Ref Range Status  10/21/2022 33.8 31.5 - 35.7 g/dL Final  13/02/6577 46.9 30.0 - 36.0 g/dL Final   [facility]  Date Value Ref Range Status  10/21/2022 27.8 26.6 - 33.0 pg Final  11/16/2020 27.2 26.0 - 34.0 pg Final   MCV  Date Value Ref Range Status  10/21/2022 82 79 - 97 fL Final   Platelet Count, POC  Date Value Ref Range Status  10/23/2013 260 142 - 424 K/uL Final   RDW  Date Value Ref Range Status  10/21/2022 13.7 11.7 - 15.4 % Final   RDW, POC  Date Value Ref Range Status  10/23/2013 14.2 % Final          omeprazole (PRILOSEC) 20 MG capsule 90 capsule 1    Sig: Take 1 capsule (20 mg total) by mouth daily.     Gastroenterology: Proton Pump Inhibitors Passed - 02/28/2023  2:53 PM      Passed - Valid encounter within last 12 months    Recent Outpatient Visits           2 months ago Acute right ankle pain   [facility] [place], [place] W, NP   2 months ago Acute right ankle pain   [place] [facility] [place], [place] W, NP   4 months ago Type 2 diabetes mellitus with hyperglycemia, without long-term current use of insulin [facility])   [place] [facility] [place], [place] W, NP   1 year ago Type 2 diabetes mellitus with hyperglycemia, without long-term current use of insulin [facility])   [facility] [place], [place], [place]   1 year ago Chronic left-sided low back pain with left-sided sciatica   [facility] Claiborne Rigg, NP       Future Appointments             In 1 month Claiborne Rigg, NP  [facility]             atorvastatin (LIPITOR) 40 MG tablet 90 tablet 1    Sig: Take 1 tablet (40 mg total) by mouth daily.     Cardiovascular:  Antilipid - Statins Failed - 02/28/2023  2:53 PM       Failed - Lipid Panel in normal range within the last 12 months    Cholesterol, Total  Date Value Ref Range Status  10/21/2022 181 100 - 199 mg/dL Final   LDL Chol Calc (NIH)  Date Value Ref Range Status  10/21/2022 111 (H) 0 - 99 mg/dL Final   HDL  Date Value Ref Range Status  10/21/2022 41 >39 mg/dL Final   Triglycerides  Date Value Ref Range Status  10/21/2022 162 (H) 0 - 149 mg/dL Final  Passed - Patient is not pregnant      Passed - Valid encounter within last 12 months    Recent Outpatient Visits           2 months ago Acute right ankle pain   [place] [facility] [place], [place] W, NP   2 months ago Acute right ankle pain   [facility] [place], [place] W, NP   4 months ago Type 2 diabetes mellitus with hyperglycemia, without long-term current use of insulin [facility])   [place] [place], [place] W, NP   1 year ago Type 2 diabetes mellitus with hyperglycemia, without long-term current use of insulin [facility])   [facility] [place], [place], [place]   1 year ago Chronic left-sided low back pain with left-sided sciatica   [place] [facility] Claiborne Rigg, NP       Future Appointments             In 1 month Claiborne Rigg, NP [facility]

## 2023-03-03 NOTE — Telephone Encounter (Signed)
Requested Prescriptions  Pending Prescriptions Disp Refills   metFORMIN (GLUCOPHAGE) 500 MG tablet 270 tablet 1    Sig: 2 in the morning with food and 1 in the evening with food     Endocrinology:  Diabetes - Biguanides Failed - 02/28/2023  2:53 PM      Failed - B12 Level in normal range and within 720 days    No results found for: "VITAMINB12"       Passed - Cr in normal range and within 360 days    Creat  Date Value Ref Range Status  07/06/2015 0.64 0.50 - 1.10 mg/dL Final   Creatinine, Ser  Date Value Ref Range Status  10/21/2022 0.69 0.57 - 1.00 mg/dL Final         Passed - HBA1C is between 0 and 7.9 and within 180 days    Hgb A1c MFr Bld  Date Value Ref Range Status  10/21/2022 6.2 (H) 4.8 - 5.6 % Final    Comment:             Prediabetes: 5.7 - 6.4          Diabetes: >6.4          Glycemic control for adults with diabetes: <7.0          Passed - eGFR in normal range and within 360 days    GFR calc Af Amer  Date Value Ref Range Status  08/02/2020 125 >59 mL/min/1.73 Final    Comment:    **In accordance with recommendations from the NKF-ASN Task force,**   [facility] is in the process of updating its eGFR calculation to the   2021 CKD-EPI creatinine equation that estimates kidney function   without a race variable.    GFR, Estimated  Date Value Ref Range Status  11/16/2020 >60 >60 mL/min Final    Comment:    (NOTE) Calculated using the CKD-EPI Creatinine Equation (2021)    eGFR  Date Value Ref Range Status  10/21/2022 105 >59 mL/min/1.73 Final         Passed - Valid encounter within last 6 months    Recent Outpatient Visits           2 months ago Acute right ankle pain   [place] [place], [place] W, NP   2 months ago Acute right ankle pain   [place] [facility] [place], [place] W, NP   4 months ago Type 2 diabetes mellitus with hyperglycemia, without long-term current use of insulin [facility])    [place] [facility] [place], [place] W, NP   1 year ago Type 2 diabetes mellitus with hyperglycemia, without long-term current use of insulin [facility])   [place] [facility] [place], [place], [place]   1 year ago Chronic left-sided low back pain with left-sided sciatica   [facility] Claiborne Rigg, NP       Future Appointments             In 1 month Claiborne Rigg, NP [facility]            Passed - CBC within normal limits and completed in the last 12 months    WBC  Date Value Ref Range Status  10/21/2022 5.2 3.4 - 10.8 x10E3/uL Final  11/16/2020 8.2 4.0 - 10.5 K/uL Final   RBC  Date Value Ref  Range Status  10/21/2022 4.93 3.77 - 5.28 x10E6/uL Final  11/16/2020 4.70 3.87 - 5.11 MIL/uL Final   Hemoglobin  Date Value Ref Range Status  10/21/2022 13.7 11.1 - 15.9 g/dL Final   Hematocrit  Date Value Ref Range Status  10/21/2022 40.5 34.0 - 46.6 % Final   MCHC  Date Value Ref Range Status  10/21/2022 33.8 31.5 - 35.7 g/dL Final  40/98/1191 47.8 30.0 - 36.0 g/dL Final   [facility]  Date Value Ref Range Status  10/21/2022 27.8 26.6 - 33.0 pg Final  11/16/2020 27.2 26.0 - 34.0 pg Final   MCV  Date Value Ref Range Status  10/21/2022 82 79 - 97 fL Final   Platelet Count, POC  Date Value Ref Range Status  10/23/2013 260 142 - 424 K/uL Final   RDW  Date Value Ref Range Status  10/21/2022 13.7 11.7 - 15.4 % Final   RDW, POC  Date Value Ref Range Status  10/23/2013 14.2 % Final          meloxicam (MOBIC) 7.5 MG tablet 90 tablet 1    Sig: Take 1 tablet (7.5 mg total) by mouth daily.     Analgesics:  COX2 Inhibitors Failed - 02/28/2023  2:53 PM      Failed - Manual Review: Labs are only required if the patient has taken medication for more than 8 weeks.      Passed - HGB in normal range and within 360 days    Hemoglobin  Date Value Ref Range Status   10/21/2022 13.7 11.1 - 15.9 g/dL Final         Passed - Cr in normal range and within 360 days    Creat  Date Value Ref Range Status  07/06/2015 0.64 0.50 - 1.10 mg/dL Final   Creatinine, Ser  Date Value Ref Range Status  10/21/2022 0.69 0.57 - 1.00 mg/dL Final         Passed - HCT in normal range and within 360 days    Hematocrit  Date Value Ref Range Status  10/21/2022 40.5 34.0 - 46.6 % Final         Passed - AST in normal range and within 360 days    AST  Date Value Ref Range Status  10/21/2022 17 0 - 40 IU/L Final         Passed - ALT in normal range and within 360 days    ALT  Date Value Ref Range Status  10/21/2022 19 0 - 32 IU/L Final         Passed - eGFR is 30 or above and within 360 days    GFR calc Af Amer  Date Value Ref Range Status  08/02/2020 125 >59 mL/min/1.73 Final    Comment:    **In accordance with recommendations from the NKF-ASN Task force,**   [facility] is in the process of updating its eGFR calculation to the   2021 CKD-EPI creatinine equation that estimates kidney function   without a race variable.    GFR, Estimated  Date Value Ref Range Status  11/16/2020 >60 >60 mL/min Final    Comment:    (NOTE) Calculated using the CKD-EPI Creatinine Equation (2021)    eGFR  Date Value Ref Range Status  10/21/2022 105 >59 mL/min/1.73 Final         Passed - Patient is not pregnant      Passed - Valid encounter within last 12 months    Recent Outpatient Visits  2 months ago Acute right ankle pain   [facility] [place], [place] W, NP   2 months ago Acute right ankle pain   [place] [facility] [place], [place] W, NP   4 months ago Type 2 diabetes mellitus with hyperglycemia, without long-term current use of insulin [facility])   [place] [place], [place] W, NP   1 year ago Type 2 diabetes mellitus with hyperglycemia, without long-term current  use of insulin [facility])   [place] [facility] [place], [place], [place]   1 year ago Chronic left-sided low back pain with left-sided sciatica   [place] [facility] Claiborne Rigg, NP       Future Appointments             In 1 month Claiborne Rigg, NP [facility]             omeprazole (PRILOSEC) 20 MG capsule 90 capsule 1    Sig: Take 1 capsule (20 mg total) by mouth daily.     Gastroenterology: Proton Pump Inhibitors Passed - 02/28/2023  2:53 PM      Passed - Valid encounter within last 12 months    Recent Outpatient Visits           2 months ago Acute right ankle pain   [place] [facility] [place], [place] W, NP   2 months ago Acute right ankle pain   [facility] [place], [place] W, NP   4 months ago Type 2 diabetes mellitus with hyperglycemia, without long-term current use of insulin [facility])   [place] [facility] [place], [place] W, NP   1 year ago Type 2 diabetes mellitus with hyperglycemia, without long-term current use of insulin [facility])   [place] [facility] [place], [place], [place]   1 year ago Chronic left-sided low back pain with left-sided sciatica   [place] [facility] Claiborne Rigg, NP       Future Appointments             In 1 month Claiborne Rigg, NP [place] [facility]             atorvastatin (LIPITOR) 40 MG tablet 90 tablet 1    Sig: Take 1 tablet (40 mg total) by mouth daily.     Cardiovascular:  Antilipid - Statins Failed - 02/28/2023  2:53 PM      Failed - Lipid Panel in normal range within the last 12 months    Cholesterol, Total  Date Value Ref Range Status  10/21/2022 181 100 - 199 mg/dL Final   LDL Chol Calc (NIH)  Date Value Ref Range Status  10/21/2022 111 (H) 0 - 99 mg/dL Final    HDL  Date Value Ref Range Status  10/21/2022 41 >39 mg/dL Final   Triglycerides  Date Value Ref Range Status  10/21/2022 162 (H) 0 - 149 mg/dL Final         Passed - Patient is not pregnant      Passed - Valid encounter within last 12 months    Recent Outpatient Visits           2 months ago Acute right ankle pain   [facility] [place], [place] W, NP   2 months ago Acute right ankle pain   [place] [facility] [place], [place] W, NP   4 months ago Type 2 diabetes mellitus with hyperglycemia, without long-term current use of insulin [facility])   [place] [facility] [place], [place] W, NP   1 year ago Type 2 diabetes mellitus with hyperglycemia, without long-term current use of insulin [facility])   [place] [facility] [place], [place], [place]   1 year ago Chronic left-sided low back pain with left-sided sciatica   [facility] Claiborne Rigg, NP       Future Appointments             In 1 month Claiborne Rigg, NP [facility]

## 2023-04-22 ENCOUNTER — Encounter: Payer: Self-pay | Admitting: Nurse Practitioner

## 2023-04-22 ENCOUNTER — Ambulatory Visit: Payer: Self-pay | Attending: Nurse Practitioner | Admitting: Nurse Practitioner

## 2023-04-22 VITALS — BP 121/76 | HR 72 | Ht 62.0 in | Wt 158.8 lb

## 2023-04-22 DIAGNOSIS — Z7984 Long term (current) use of oral hypoglycemic drugs: Secondary | ICD-10-CM

## 2023-04-22 DIAGNOSIS — E1165 Type 2 diabetes mellitus with hyperglycemia: Secondary | ICD-10-CM

## 2023-04-22 DIAGNOSIS — Z23 Encounter for immunization: Secondary | ICD-10-CM

## 2023-04-22 LAB — POCT GLYCOSYLATED HEMOGLOBIN (HGB A1C): HbA1c, POC (controlled diabetic range): 6.8 % (ref 0.0–7.0)

## 2023-04-22 MED ORDER — METFORMIN HCL 500 MG PO TABS
1000.0000 mg | ORAL_TABLET | Freq: Two times a day (BID) | ORAL | 1 refills | Status: DC
Start: 2023-04-22 — End: 2023-11-03

## 2023-04-22 NOTE — Progress Notes (Signed)
Assessment & Plan:  Cheryl Patrick was seen today for diabetes.  Diagnoses and all orders for this visit:  Type 2 diabetes mellitus with hyperglycemia, without long-term current use of insulin DOSE CHANGE: Increase metformin to 1000 mg BID. -     POCT glycosylated hemoglobin (Hb A1C) -     Urine Albumin/Creatinine with ratio (send out) [LAB689] -     CMP14+EGFR -     metFORMIN (GLUCOPHAGE) 500 MG tablet; Take 2 tablets (1,000 mg total) by mouth 2 (two) times daily with a meal.    Patient has been counseled on age-appropriate routine health concerns for screening and prevention. These are reviewed and up-to-date. Referrals have been placed accordingly. Immunizations are up-to-date or declined.    Subjective:   Chief Complaint  Patient presents with   Diabetes    Cheryl Patrick 52 y.o. female presents to office today for follow up to DM 2  She has a past medical history of Anxiety, DM2 and Hyperlipemia.    DM 2 Currently well controlled but A1c is increasing. She is taking metformin 1000 mg in the am and 500 mg in the pm. Will change metformin dose today as goal A1c is <6.5 Lab Results  Component Value Date   HGBA1C 6.8 04/22/2023    Lab Results  Component Value Date   HGBA1C 6.2 (H) 10/21/2022  LDL not at goal with atorvastatin  Lab Results  Component Value Date   LDLCALC 111 (H) 10/21/2022    Blood pressure is well controlled.  BP Readings from Last 3 Encounters:  04/22/23 121/76  12/19/22 120/70  12/10/22 127/79      Review of Systems  Constitutional:  Negative for fever, malaise/fatigue and weight loss.  HENT: Negative.  Negative for nosebleeds.   Eyes: Negative.  Negative for blurred vision, double vision and photophobia.  Respiratory: Negative.  Negative for cough and shortness of breath.   Cardiovascular: Negative.  Negative for chest pain, palpitations and leg swelling.  Gastrointestinal: Negative.  Negative for heartburn, nausea and vomiting.   Musculoskeletal: Negative.  Negative for myalgias.  Neurological: Negative.  Negative for dizziness, focal weakness, seizures and headaches.  Psychiatric/Behavioral: Negative.  Negative for suicidal ideas.     Past Medical History:  Diagnosis Date   Anxiety    Diabetes mellitus without complication (HCC)    Hyperlipemia     Past Surgical History:  Procedure Laterality Date   DILATION AND EVACUATION N/A 05/10/2017   Procedure: DILATATION AND EVACUATION;  Surgeon: [place] Bing, MD;  Location: [facility];  Service: Gynecology;  Laterality: N/A;   NO PAST SURGERIES      Family History  Problem Relation Age of Onset   Lymphoma Mother    Hypertension Mother    Diabetes Mother    Diabetes Father    Hypertension Father    Breast cancer Neg Hx     Social History Reviewed with no changes to be made today.   Outpatient Medications Prior to Visit  Medication Sig Dispense Refill   acetaminophen (TYLENOL) 500 MG tablet Take 500 mg by mouth every 6 (six) hours as needed for moderate pain.     atorvastatin (LIPITOR) 40 MG tablet Take 1 tablet (40 mg total) by mouth daily. 90 tablet 1   hydrocortisone (ANUSOL-HC) 2.5 % rectal cream Place 1 Application rectally 2 (two) times daily. 60 g 1   meloxicam (MOBIC) 7.5 MG tablet Take 1 tablet (7.5 mg total) by mouth daily. 90 tablet 0   omeprazole (PRILOSEC) 20  MG capsule Take 1 capsule (20 mg total) by mouth daily. 90 capsule 1   polyethylene glycol powder (GLYCOLAX/MIRALAX) 17 GM/SCOOP powder Take 17 g by mouth daily. Mix in 8oz of water, juice, coffee or tea 3350 g 1   senna-docusate (SENOKOT-S) 8.6-50 MG tablet Take 2 tablets by mouth daily. 60 tablet 3   metFORMIN (GLUCOPHAGE) 500 MG tablet 2 in the morning with food and 1 in the evening with food 270 tablet 1   No facility-administered medications prior to visit.    No Known Allergies     Objective:    BP 121/76 (BP Location: Left Arm, Patient Position: Sitting, Cuff Size: Normal)    Pulse 72   Ht 5\' 2"  (1.575 m)   Wt 158 lb 12.8 oz (72 kg)   LMP 03/31/2021 (Exact Date)   SpO2 98%   BMI 29.04 kg/m  Wt Readings from Last 3 Encounters:  04/22/23 158 lb 12.8 oz (72 kg)  12/19/22 159 lb 9.6 oz (72.4 kg)  12/10/22 161 lb 6.4 oz (73.2 kg)    Physical Exam Vitals and nursing note reviewed.  Constitutional:      Appearance: She is well-developed.  HENT:     Head: Normocephalic and atraumatic.  Cardiovascular:     Rate and Rhythm: Normal rate and regular rhythm.     Heart sounds: Normal heart sounds. No murmur heard.    No friction rub. No gallop.  Pulmonary:     Effort: Pulmonary effort is normal. No tachypnea or respiratory distress.     Breath sounds: Normal breath sounds. No decreased breath sounds, wheezing, rhonchi or rales.  Chest:     Chest wall: No tenderness.  Abdominal:     General: Bowel sounds are normal.     Palpations: Abdomen is soft.  Musculoskeletal:        General: Normal range of motion.     Cervical back: Normal range of motion.  Skin:    General: Skin is warm and dry.  Neurological:     Mental Status: She is alert and oriented to person, place, and time.     Coordination: Coordination normal.  Psychiatric:        Behavior: Behavior normal. Behavior is cooperative.        Thought Content: Thought content normal.        Judgment: Judgment normal.          Patient has been counseled extensively about nutrition and exercise as well as the importance of adherence with medications and regular follow-up. The patient was given clear instructions to go to ER or return to medical center if symptoms don't improve, worsen or new problems develop. The patient verbalized understanding.   Follow-up: Return in about 3 months (around 07/22/2023).   Claiborne Rigg, FNP-BC [facility] [place], [place] [phone]   04/22/2023, 8:57 AM

## 2023-04-23 LAB — MICROALBUMIN / CREATININE URINE RATIO
Creatinine, Urine: 75.7 mg/dL
Microalb/Creat Ratio: 6 mg/g creat (ref 0–29)
Microalbumin, Urine: 4.6 ug/mL

## 2023-04-23 LAB — CMP14+EGFR
ALT: 17 IU/L (ref 0–32)
AST: 21 IU/L (ref 0–40)
Albumin: 4.5 g/dL (ref 3.8–4.9)
Alkaline Phosphatase: 97 IU/L (ref 44–121)
BUN/Creatinine Ratio: 20 (ref 9–23)
BUN: 13 mg/dL (ref 6–24)
Bilirubin Total: 0.4 mg/dL (ref 0.0–1.2)
CO2: 21 mmol/L (ref 20–29)
Calcium: 9.4 mg/dL (ref 8.7–10.2)
Chloride: 103 mmol/L (ref 96–106)
Creatinine, Ser: 0.66 mg/dL (ref 0.57–1.00)
Globulin, Total: 2.4 g/dL (ref 1.5–4.5)
Glucose: 149 mg/dL — ABNORMAL HIGH (ref 70–99)
Potassium: 4.2 mmol/L (ref 3.5–5.2)
Sodium: 141 mmol/L (ref 134–144)
Total Protein: 6.9 g/dL (ref 6.0–8.5)
eGFR: 106 mL/min/{1.73_m2} (ref >59)

## 2023-07-29 ENCOUNTER — Encounter: Payer: Self-pay | Admitting: Nurse Practitioner

## 2023-07-29 ENCOUNTER — Ambulatory Visit: Payer: Self-pay | Attending: Nurse Practitioner | Admitting: Nurse Practitioner

## 2023-07-29 VITALS — BP 117/80 | HR 63 | Wt 155.6 lb

## 2023-07-29 DIAGNOSIS — G8929 Other chronic pain: Secondary | ICD-10-CM

## 2023-07-29 DIAGNOSIS — Z7984 Long term (current) use of oral hypoglycemic drugs: Secondary | ICD-10-CM

## 2023-07-29 DIAGNOSIS — M5442 Lumbago with sciatica, left side: Secondary | ICD-10-CM

## 2023-07-29 DIAGNOSIS — E119 Type 2 diabetes mellitus without complications: Secondary | ICD-10-CM

## 2023-07-29 MED ORDER — KETOROLAC TROMETHAMINE 60 MG/2ML IM SOLN
60.0000 mg | Freq: Once | INTRAMUSCULAR | Status: AC
  Administered 2023-07-29: 60 mg via INTRAMUSCULAR

## 2023-07-29 MED ORDER — GABAPENTIN 100 MG PO CAPS
100.0000 mg | ORAL_CAPSULE | Freq: Two times a day (BID) | ORAL | 3 refills | Status: AC
Start: 2023-07-29 — End: ?

## 2023-07-29 NOTE — Progress Notes (Signed)
 Assessment & Plan:  Cheryl Patrick was seen today for back pain and medical management of chronic issues.  Diagnoses and all orders for this visit:  Chronic left-sided low back pain with left-sided sciatica Stop meloxicam . Trial on gabapentin . -     Ambulatory referral to Physical Medicine Rehab -     gabapentin  (NEURONTIN ) 100 MG capsule; Take 1 capsule (100 mg total) by mouth 2 (two) times daily. -     ketorolac  (TORADOL ) injection 60 mg Patient was urged to apply for the financial assistance program.  They were instructed to inquire at the front desk about the application process for the [place] discount, orange card or other financial assistance.      Diabetes mellitus treated with oral medication [facility]) -     CMP14+EGFR    Patient has been counseled on age-appropriate routine health concerns for screening and prevention. These are reviewed and up-to-date. Referrals have been placed accordingly. Immunizations are up-to-date or declined.    Subjective:   Chief Complaint  Patient presents with   Back Pain   Medical Management of Chronic Issues    Cheryl Patrick 52 y.o. female presents to office today for chronic back pain    VRI was used to communicate directly with patient for the entire encounter including providing detailed patient instructions.     Back Pain She is currently taking meloxicam  7.5 mg daily and at times BID for her chronic lower back pain and left hip pain. Meloxicam  has not been effective. She was initially evaluated by me on 02-06-2021 with complaints at that time of 3 month onset of pain radiating from the lower back into the left hip and left leg. Initial inciting event: none. Symptoms are worst: all day. Alleviating factors identifiable by patient are none. Exacerbating factors identifiable by patient are sitting, standing, and walking. Treatments so far initiated by patient:  OTC Tylenol   Previous lower back problems: none. Previous workup: none. Previous  treatments:  toradol  and prednisone  both of which were minimally effective. She denies any injury or trauma. Not work related injury. Denies any symptoms of involuntary loss of urine or stool    Last month she had pain in the eye and a broken blood vessel. Currently no broken blood vessel today. She does also note that she has been rubbing her eye frequently due to itching and irritation.      Review of Systems  Constitutional:  Negative for fever, malaise/fatigue and weight loss.  HENT: Negative.  Negative for nosebleeds.   Eyes: Negative.  Negative for blurred vision, double vision and photophobia.  Respiratory: Negative.  Negative for cough and shortness of breath.   Cardiovascular: Negative.  Negative for chest pain, palpitations and leg swelling.  Gastrointestinal: Negative.  Negative for heartburn, nausea and vomiting.  Musculoskeletal:  Positive for back pain. Negative for myalgias.  Neurological: Negative.  Negative for dizziness, focal weakness, seizures and headaches.  Psychiatric/Behavioral: Negative.  Negative for suicidal ideas.     Past Medical History:  Diagnosis Date   Anxiety    Diabetes mellitus without complication (HCC)    Hyperlipemia     Past Surgical History:  Procedure Laterality Date   DILATION AND EVACUATION N/A 05/10/2017   Procedure: DILATATION AND EVACUATION;  Surgeon: Izell Harari, MD;  Location: [facility];  Service: Gynecology;  Laterality: N/A;   NO PAST SURGERIES      Family History  Problem Relation Age of Onset   Lymphoma Mother    Hypertension Mother  Diabetes Mother    Diabetes Father    Hypertension Father    Breast cancer Neg Hx     Social History Reviewed with no changes to be made today.   Outpatient Medications Prior to Visit  Medication Sig Dispense Refill   acetaminophen  (TYLENOL ) 500 MG tablet Take 500 mg by mouth every 6 (six) hours as needed for moderate pain.     atorvastatin  (LIPITOR) 40 MG tablet Take 1 tablet (40  mg total) by mouth daily. 90 tablet 1   hydrocortisone  (ANUSOL -HC) 2.5 % rectal cream Place 1 Application rectally 2 (two) times daily. 60 g 1   metFORMIN  (GLUCOPHAGE ) 500 MG tablet Take 2 tablets (1,000 mg total) by mouth 2 (two) times daily with a meal. 360 tablet 1   omeprazole  (PRILOSEC) 20 MG capsule Take 1 capsule (20 mg total) by mouth daily. 90 capsule 1   polyethylene glycol powder (GLYCOLAX /MIRALAX ) 17 GM/SCOOP powder Take 17 g by mouth daily. Mix in 8oz of water, juice, coffee or tea 3350 g 1   senna-docusate (SENOKOT-S) 8.6-50 MG tablet Take 2 tablets by mouth daily. 60 tablet 3   meloxicam  (MOBIC ) 7.5 MG tablet Take 1 tablet (7.5 mg total) by mouth daily. 90 tablet 0   No facility-administered medications prior to visit.    No Known Allergies     Objective:    BP 117/80 (BP Location: Left Arm, Patient Position: Sitting, Cuff Size: Normal)   Pulse 63   Wt 155 lb 9.6 oz (70.6 kg)   LMP 03/31/2021 (Exact Date)   SpO2 97%   BMI 28.46 kg/m  Wt Readings from Last 3 Encounters:  07/29/23 155 lb 9.6 oz (70.6 kg)  04/22/23 158 lb 12.8 oz (72 kg)  12/19/22 159 lb 9.6 oz (72.4 kg)    Physical Exam Vitals and nursing note reviewed.  Constitutional:      Appearance: She is well-developed.  HENT:     Head: Normocephalic and atraumatic.  Cardiovascular:     Rate and Rhythm: Normal rate and regular rhythm.     Heart sounds: Normal heart sounds. No murmur heard.    No friction rub. No gallop.  Pulmonary:     Effort: Pulmonary effort is normal. No tachypnea or respiratory distress.     Breath sounds: Normal breath sounds. No decreased breath sounds, wheezing, rhonchi or rales.  Chest:     Chest wall: No tenderness.  Abdominal:     General: Bowel sounds are normal.     Palpations: Abdomen is soft.  Musculoskeletal:     Cervical back: Normal range of motion.     Lumbar back: Positive left straight leg raise test.  Skin:    General: Skin is warm and dry.  Neurological:      Mental Status: She is alert and oriented to person, place, and time.     Coordination: Coordination normal.  Psychiatric:        Behavior: Behavior normal. Behavior is cooperative.        Thought Content: Thought content normal.        Judgment: Judgment normal.          Patient has been counseled extensively about nutrition and exercise as well as the importance of adherence with medications and regular follow-up. The patient was given clear instructions to go to ER or return to medical center if symptoms don't improve, worsen or new problems develop. The patient verbalized understanding.   Follow-up: Return in about 3 months (around 10/27/2023) for  DM.   Haze LELON Servant, FNP-BC [facility] and [facility] [place], [place] [phone]   07/29/2023, 9:57 AM

## 2023-07-30 LAB — CMP14+EGFR
ALT: 17 [IU]/L (ref 0–32)
AST: 21 [IU]/L (ref 0–40)
Albumin: 4.4 g/dL (ref 3.8–4.9)
Alkaline Phosphatase: 99 [IU]/L (ref 44–121)
BUN/Creatinine Ratio: 26 — ABNORMAL HIGH (ref 9–23)
BUN: 16 mg/dL (ref 6–24)
Bilirubin Total: 0.3 mg/dL (ref 0.0–1.2)
CO2: 21 mmol/L (ref 20–29)
Calcium: 9.4 mg/dL (ref 8.7–10.2)
Chloride: 104 mmol/L (ref 96–106)
Creatinine, Ser: 0.62 mg/dL (ref 0.57–1.00)
Globulin, Total: 2.1 g/dL (ref 1.5–4.5)
Glucose: 127 mg/dL — ABNORMAL HIGH (ref 70–99)
Potassium: 4.4 mmol/L (ref 3.5–5.2)
Sodium: 142 mmol/L (ref 134–144)
Total Protein: 6.5 g/dL (ref 6.0–8.5)
eGFR: 108 mL/min/{1.73_m2} (ref >59)

## 2023-08-11 ENCOUNTER — Encounter: Payer: Self-pay | Admitting: Physical Medicine and Rehabilitation

## 2023-09-23 ENCOUNTER — Encounter: Payer: Self-pay | Attending: Physical Medicine and Rehabilitation | Admitting: Physical Medicine and Rehabilitation

## 2023-09-23 ENCOUNTER — Encounter: Payer: Self-pay | Admitting: Physical Medicine and Rehabilitation

## 2023-09-23 VITALS — BP 112/74 | HR 74 | Ht 62.0 in | Wt 153.6 lb

## 2023-09-23 DIAGNOSIS — G4701 Insomnia due to medical condition: Secondary | ICD-10-CM | POA: Insufficient documentation

## 2023-09-23 DIAGNOSIS — M5442 Lumbago with sciatica, left side: Secondary | ICD-10-CM | POA: Insufficient documentation

## 2023-09-23 NOTE — Patient Instructions (Addendum)
 Foods that may reduce pain: 1) Ginger (especially studied for arthritis)- reduce leukotriene production to decrease inflammation 2) Blueberries- high in phytonutrients that decrease inflammation 3) Salmon- marine omega-3s reduce joint swelling and pain 4) Pumpkin seeds- reduce inflammation 5) dark chocolate- reduces inflammation 6) turmeric- reduces inflammation 7) tart cherries - reduce pain and stiffness 8) extra virgin olive oil - its compound olecanthal helps to block prostaglandins  9) chili peppers- can be eaten or applied topically via capsaicin 10) mint- helpful for headache, muscle aches, joint pain, and itching 11) garlic- reduces inflammation  Link to further information on diet for chronic pain: http://www.bray.com/   Turmeric to reduce inflammation--can be used in cooking or taken as a supplement.  Benefits of turmeric:  -Highly anti-inflammatory  -Increases antioxidants  -Improves memory, attention, brain disease  -Lowers risk of heart disease  -May help prevent cancer  -Decreases pain  -Alleviates depression  -Delays aging and decreases risk of chronic disease  -Consume with black pepper to increase absorption    Turmeric Milk Recipe:  1 cup milk  1 tsp turmeric  1 tsp cinnamon  1 tsp grated ginger (optional)  Black pepper (boosts the anti-inflammatory properties of turmeric).  1 tsp honey   Insomnia: -Try to go outside near sunrise -Get exercise during the day.  -Turn off all devices an hour before bedtime.  -Teas that can benefit: chamomile, valerian root, Brahmi (Bacopa) -Can consider over the counter melatonin, magnesium, and/or L-theanine. Melatonin is an anti-oxidant with multiple health benefits. Magnesium is involved in greater than 300 enzymatic reactions in the body and most of [place] are deficient as our soil is often depleted. There are 7 different types of  magnesium- Bioptemizer's is a supplement with all 7 types, and each has unique benefits. Magnesium can also help with constipation and anxiety.  -Pistachios naturally increase the production of melatonin -Cozy Earth bamboo bed sheets are free from toxic chemicals.  -Tart cherry juice or a tart cherry supplement can improve sleep and soreness post-workout

## 2023-09-23 NOTE — Progress Notes (Signed)
 Subjective:    Patient ID: Cheryl Patrick, female    DOB: [birth year], 53 y.o.   MRN: [id number]  HPI Mrs. Bradly Bienenstock is a 53 year old woman who presents to establish care for left sided back and leg pain:  1) Left sided low back pain: -failed stretching and medicines such as ibuprofen, gabapentin -gabapentin makes her sleepy  2) Insomnia: -she is unable to sleep well because of the pain -she has never tried topamax  Pain Inventory Average Pain 10 Pain Right Now 8 My pain is constant, aching, and pressure and goes down her left leg  In the last 24 hours, has pain interfered with the following? General activity 5 Relation with others 4 Enjoyment of life 5 What TIME of day is your pain at its worst? morning  Sleep (in general) Poor  Pain is worse with: walking, standing, and some activites Pain improves with: medication Relief from Meds: 4  walk without assistance ability to climb steps?  yes do you drive?  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  no  Any changes since last visit?  no    Family History  Problem Relation Age of Onset   Lymphoma Mother    Hypertension Mother    Diabetes Mother    Diabetes Father    Hypertension Father    Breast cancer Neg Hx    Social History   Socioeconomic History   Marital status: Married    Spouse name: Not on file   Number of children: 3   Years of education: Not on file   Highest education level: 9th grade  Occupational History   Occupation: International aid/development worker: [employer]  Tobacco Use   Smoking status: Never   Smokeless tobacco: Never  Vaping Use   Vaping status: Never Used  Substance and Sexual Activity   Alcohol use: No   Drug use: No   Sexual activity: Yes    Birth control/protection: Post-menopausal  Other Topics Concern   Not on file  Social History Narrative   Not on  file   Social Drivers of Health   Financial Resource Strain: Not on file  Food Insecurity: No Food Insecurity (06/25/2022)   Hunger Vital Sign    Worried About Running Out of Food in the Last Year: Never true    Ran Out of Food in the Last Year: Never true  Transportation Needs: No Transportation Needs (07/29/2023)   PRAPARE - Administrator, Civil Service (Medical): No    Lack of Transportation (Non-Medical): No  Physical Activity: Not on file  Stress: Not on file  Social Connections: Not on file   Past Surgical History:  Procedure Laterality Date   DILATION AND EVACUATION N/A 05/10/2017   Procedure: DILATATION AND EVACUATION;  Surgeon: [place] Bing, MD;  Location: [facility];  Service: Gynecology;  Laterality: N/A;   NO PAST SURGERIES     Past Medical History:  Diagnosis Date   Anxiety    Diabetes mellitus without complication (HCC)    Hyperlipemia    BP 112/74   Pulse 74   Ht 5\' 2"  (1.575 m)   Wt 153 lb 9.6 oz (69.7 kg)   LMP 03/31/2021 (Exact Date)   SpO2 98%   BMI 28.09 kg/m   Opioid Risk Score:   Fall Risk Score:  `1  Depression screen  PHQ 2/9     09/23/2023    2:32 PM 04/22/2023    8:32 AM 12/19/2022   11:09 AM 10/15/2022    4:22 PM 10/11/2021    9:03 AM 01/11/2021    9:28 AM 08/02/2020    8:50 AM  Depression screen PHQ 2/9  Decreased Interest 0 0 0 0 2 2 3   Down, Depressed, Hopeless 0 0 0 0 0 2 0  PHQ - 2 Score 0 0 0 0 2 4 3   Altered sleeping 3  0 0 0 0 2  Tired, decreased energy 3  0 2 3 0 3  Change in appetite 2  0 0 0 0 0  Feeling bad or failure about yourself  0  0 0 0 0 0  Trouble concentrating 2  0 0 2 2 0  Moving slowly or fidgety/restless 0  0 0 0 0 0  Suicidal thoughts 0  0 0 0 0 0  PHQ-9 Score 10  0 2 7 6 8   Difficult doing work/chores     Somewhat difficult      Review of Systems  Musculoskeletal:  Positive for back pain.       Spasms low back and down left leg  Psychiatric/Behavioral:  Positive for dysphoric mood.   All  other systems reviewed and are negative.     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  -topamax 25mg  prescribed HS -Discussed benefits of exercise in reducing pain. -Discussed following foods that may reduce pain: 1) Ginger (especially studied for arthritis)- reduce leukotriene production to decrease inflammation 2) Blueberries- high in phytonutrients that decrease inflammation 3) Salmon- marine omega-3s reduce joint swelling and pain 4) Pumpkin seeds- reduce inflammation 5) dark chocolate- reduces inflammation 6) turmeric- reduces inflammation 7) tart cherries - reduce pain and stiffness 8) extra virgin olive oil - its compound olecanthal helps to block prostaglandins  9) chili peppers- can be eaten or applied topically via capsaicin 10) mint- helpful for headache, muscle aches, joint pain, and itching 11) garlic- reduces inflammation  Link to further information on diet for chronic pain: http://www.bray.com/    2) Insomnia: -topamax 25mg  HS -Try to go outside near sunrise -Get exercise during the day.  -Turn off all devices an hour before bedtime.  -Teas that can benefit: chamomile, valerian root, Brahmi (Bacopa) -Can consider over the counter melatonin, magnesium, and/or L-theanine. Melatonin is an anti-oxidant with multiple health benefits. Magnesium is involved in greater than 300 enzymatic reactions in the body and most of [place] are deficient as our soil is often depleted. There are 7  different types of magnesium- Bioptemizer's is a supplement with all 7 types, and each has unique benefits. Magnesium can also help with constipation and anxiety.  -Pistachios naturally increase the production of melatonin -Cozy Earth bamboo bed sheets are free from toxic chemicals.  -Tart cherry juice or a tart cherry supplement can improve sleep and soreness post-workout

## 2023-10-27 ENCOUNTER — Ambulatory Visit: Payer: Self-pay | Attending: Nurse Practitioner | Admitting: Nurse Practitioner

## 2023-10-27 ENCOUNTER — Encounter: Payer: Self-pay | Admitting: Nurse Practitioner

## 2023-10-27 VITALS — BP 115/70 | HR 74 | Resp 19 | Ht 62.0 in | Wt 154.8 lb

## 2023-10-27 DIAGNOSIS — K219 Gastro-esophageal reflux disease without esophagitis: Secondary | ICD-10-CM

## 2023-10-27 DIAGNOSIS — E785 Hyperlipidemia, unspecified: Secondary | ICD-10-CM

## 2023-10-27 DIAGNOSIS — E1165 Type 2 diabetes mellitus with hyperglycemia: Secondary | ICD-10-CM

## 2023-10-27 LAB — POCT GLYCOSYLATED HEMOGLOBIN (HGB A1C): HbA1c, POC (controlled diabetic range): 6.5 % (ref 0.0–7.0)

## 2023-10-27 MED ORDER — OMEPRAZOLE 20 MG PO CPDR
20.0000 mg | DELAYED_RELEASE_CAPSULE | Freq: Two times a day (BID) | ORAL | 1 refills | Status: AC
Start: 2023-10-27 — End: ?

## 2023-10-27 NOTE — Progress Notes (Signed)
 Assessment & Plan:  Cheryl Patrick was seen today for medical management of chronic issues.  Diagnoses and all orders for this visit:  Type 2 diabetes mellitus with hyperglycemia, without long-term current use of insulin (HCC) -     POCT glycosylated hemoglobin (Hb A1C) -     CMP14+EGFR Continue blood sugar control as discussed in office today, low carbohydrate diet, and regular physical exercise as tolerated, 150 minutes per week (30 min each day, 5 days per week, or 50 min 3 days per week). Keep blood sugar logs with fasting goal of 90-130 mg/dl, post prandial (after you eat) less than 180.  For Hypoglycemia: BS <60 and Hyperglycemia BS >400; contact the clinic ASAP. Annual eye exams and foot exams are recommended.   Dyslipidemia, goal LDL below 70 -     Lipid panel INSTRUCTIONS: Work on a low fat, heart healthy diet and participate in regular aerobic exercise program by working out at least 150 minutes per week; 5 days a week-30 minutes per day. Avoid red meat/beef/steak,  fried foods. junk foods, sodas, sugary drinks, unhealthy snacking, alcohol and smoking.  Drink at least 80 oz of water per day and monitor your carbohydrate intake daily.     Gastroesophageal reflux disease without esophagitis -     omeprazole (PRILOSEC) 20 MG capsule; Take 1 capsule (20 mg total) by mouth 2 (two) times daily before a meal. INSTRUCTIONS: Avoid GERD Triggers: acidic, spicy or fried foods, caffeine, coffee, sodas,  alcohol and chocolate.      Patient has been counseled on age-appropriate routine health concerns for screening and prevention. These are reviewed and up-to-date. Referrals have been placed accordingly. Immunizations are up-to-date or declined.    Subjective:   Chief Complaint  Patient presents with   Medical Management of Chronic Issues    Cheryl Patrick 53 y.o. female presents to office today for follow up to DM  VRI was used to communicate directly with patient for the entire encounter  including providing detailed patient instructions.    She has a past medical history of Anxiety, DM2 and Hyperlipemia.   DM 2 Currently well controlled but A1c is increasing. She is taking metformin 1000 mg in the am and 1000 mg in the pm. Will change metformin dose today as goal A1c is <6.5 She endorses burning in her stomach with certain foods after taking metformin. She is taking omeprazole 20 mg daily as prescribed.  Lab Results  Component Value Date   HGBA1C 6.5 10/27/2023    Lab Results  Component Value Date   HGBA1C 6.8 04/22/2023   BP Readings from Last 3 Encounters:  10/27/23 115/70  09/23/23 112/74  07/29/23 117/80    Lab Results  Component Value Date   LDLCALC 111 (H) 10/21/2022     She is currently taking natural supplements  and eating more vegetables for her back pain (turmeric, ginger, blueberries, broccoli). Notes improvement in pain since starting. She does continue to take gabapentin but not daily.    Review of Systems  Constitutional:  Negative for fever, malaise/fatigue and weight loss.  HENT: Negative.  Negative for nosebleeds.   Eyes: Negative.  Negative for blurred vision, double vision and photophobia.  Respiratory: Negative.  Negative for cough and shortness of breath.   Cardiovascular: Negative.  Negative for chest pain, palpitations and leg swelling.  Gastrointestinal:  Positive for heartburn. Negative for nausea and vomiting.  Musculoskeletal: Negative.  Negative for myalgias.  Neurological: Negative.  Negative for dizziness, focal weakness, seizures  and headaches.  Psychiatric/Behavioral: Negative.  Negative for suicidal ideas.     Past Medical History:  Diagnosis Date   Anxiety    Diabetes mellitus without complication (HCC)    Hyperlipemia     Past Surgical History:  Procedure Laterality Date   DILATION AND EVACUATION N/A 05/10/2017   Procedure: DILATATION AND EVACUATION;  Surgeon: [place] Bing, MD;  Location: [facility];  Service:  Gynecology;  Laterality: N/A;   NO PAST SURGERIES      Family History  Problem Relation Age of Onset   Lymphoma Mother    Hypertension Mother    Diabetes Mother    Diabetes Father    Hypertension Father    Breast cancer Neg Hx     Social History Reviewed with no changes to be made today.   Outpatient Medications Prior to Visit  Medication Sig Dispense Refill   acetaminophen (TYLENOL) 500 MG tablet Take 500 mg by mouth every 6 (six) hours as needed for moderate pain.     atorvastatin (LIPITOR) 40 MG tablet Take 1 tablet (40 mg total) by mouth daily. 90 tablet 1   gabapentin (NEURONTIN) 100 MG capsule Take 1 capsule (100 mg total) by mouth 2 (two) times daily. 60 capsule 3   hydrocortisone (ANUSOL-HC) 2.5 % rectal cream Place 1 Application rectally 2 (two) times daily. 60 g 1   metFORMIN (GLUCOPHAGE) 500 MG tablet Take 2 tablets (1,000 mg total) by mouth 2 (two) times daily with a meal. 360 tablet 1   senna-docusate (SENOKOT-S) 8.6-50 MG tablet Take 2 tablets by mouth daily. 60 tablet 3   omeprazole (PRILOSEC) 20 MG capsule Take 1 capsule (20 mg total) by mouth daily. 90 capsule 1   polyethylene glycol powder (GLYCOLAX/MIRALAX) 17 GM/SCOOP powder Take 17 g by mouth daily. Mix in 8oz of water, juice, coffee or tea (Patient not taking: Reported on 10/27/2023) 3350 g 1   No facility-administered medications prior to visit.    No Known Allergies     Objective:    BP 115/70 (BP Location: Left Arm, Patient Position: Sitting, Cuff Size: Normal)   Pulse 74   Resp 19   Ht 5\' 2"  (1.575 m)   Wt 154 lb 12.8 oz (70.2 kg)   LMP 03/31/2021 (Exact Date)   SpO2 96%   BMI 28.31 kg/m  Wt Readings from Last 3 Encounters:  10/27/23 154 lb 12.8 oz (70.2 kg)  09/23/23 153 lb 9.6 oz (69.7 kg)  07/29/23 155 lb 9.6 oz (70.6 kg)    Physical Exam Vitals and nursing note reviewed.  Constitutional:      Appearance: She is well-developed.  HENT:     Head: Normocephalic and atraumatic.   Cardiovascular:     Rate and Rhythm: Normal rate and regular rhythm.     Heart sounds: Normal heart sounds. No murmur heard.    No friction rub. No gallop.  Pulmonary:     Effort: Pulmonary effort is normal. No tachypnea or respiratory distress.     Breath sounds: Normal breath sounds. No decreased breath sounds, wheezing, rhonchi or rales.  Chest:     Chest wall: No tenderness.  Abdominal:     General: Bowel sounds are normal.     Palpations: Abdomen is soft.  Musculoskeletal:        General: Normal range of motion.     Cervical back: Normal range of motion.  Skin:    General: Skin is warm and dry.  Neurological:     Mental  Status: She is alert and oriented to person, place, and time.     Coordination: Coordination normal.  Psychiatric:        Behavior: Behavior normal. Behavior is cooperative.        Thought Content: Thought content normal.        Judgment: Judgment normal.          Patient has been counseled extensively about nutrition and exercise as well as the importance of adherence with medications and regular follow-up. The patient was given clear instructions to go to ER or return to medical center if symptoms don't improve, worsen or new problems develop. The patient verbalized understanding.   Follow-up: Return in about 3 months (around 01/26/2024).   Claiborne Rigg, FNP-BC [facility] and [facility] [place], [place] [phone]   10/27/2023, 9:55 AM

## 2023-10-28 LAB — CMP14+EGFR
ALT: 20 IU/L (ref 0–32)
AST: 22 IU/L (ref 0–40)
Albumin: 4.2 g/dL (ref 3.8–4.9)
Alkaline Phosphatase: 97 IU/L (ref 44–121)
BUN/Creatinine Ratio: 22 (ref 9–23)
BUN: 15 mg/dL (ref 6–24)
Bilirubin Total: 0.3 mg/dL (ref 0.0–1.2)
CO2: 23 mmol/L (ref 20–29)
Calcium: 9.5 mg/dL (ref 8.7–10.2)
Chloride: 103 mmol/L (ref 96–106)
Creatinine, Ser: 0.67 mg/dL (ref 0.57–1.00)
Globulin, Total: 2.3 g/dL (ref 1.5–4.5)
Glucose: 130 mg/dL — ABNORMAL HIGH (ref 70–99)
Potassium: 4.5 mmol/L (ref 3.5–5.2)
Sodium: 142 mmol/L (ref 134–144)
Total Protein: 6.5 g/dL (ref 6.0–8.5)
eGFR: 105 mL/min/{1.73_m2} (ref >59)

## 2023-10-28 LAB — LIPID PANEL
Chol/HDL Ratio: 4.3 ratio (ref 0.0–4.4)
Cholesterol, Total: 178 mg/dL (ref 100–199)
HDL: 41 mg/dL (ref >39)
LDL Chol Calc (NIH): 105 mg/dL — ABNORMAL HIGH (ref 0–99)
Triglycerides: 182 mg/dL — ABNORMAL HIGH (ref 0–149)
VLDL Cholesterol Cal: 32 mg/dL (ref 5–40)

## 2023-10-29 ENCOUNTER — Encounter: Payer: Self-pay | Admitting: Nurse Practitioner

## 2023-11-02 ENCOUNTER — Other Ambulatory Visit: Payer: Self-pay | Admitting: Nurse Practitioner

## 2023-11-02 DIAGNOSIS — E1165 Type 2 diabetes mellitus with hyperglycemia: Secondary | ICD-10-CM

## 2023-11-24 ENCOUNTER — Encounter: Payer: Self-pay | Admitting: Physical Medicine and Rehabilitation

## 2024-01-08 ENCOUNTER — Ambulatory Visit
Admission: RE | Admit: 2024-01-08 | Discharge: 2024-01-08 | Disposition: A | Discharge status: Home / Self Care | Payer: Self-pay | Source: Ambulatory Visit | Attending: Family Medicine | Admitting: Family Medicine

## 2024-01-08 ENCOUNTER — Ambulatory Visit: Payer: Self-pay

## 2024-01-08 VITALS — BP 121/71 | HR 71 | Temp 98.1°F | Resp 16

## 2024-01-08 DIAGNOSIS — A084 Viral intestinal infection, unspecified: Secondary | ICD-10-CM

## 2024-01-08 LAB — POCT URINALYSIS DIP (MANUAL ENTRY)
Bilirubin, UA: NEGATIVE
Blood, UA: NEGATIVE
Glucose, UA: NEGATIVE mg/dL
Leukocytes, UA: NEGATIVE
Nitrite, UA: NEGATIVE
Protein Ur, POC: NEGATIVE mg/dL
Spec Grav, UA: 1.02 (ref 1.010–1.025)
Urobilinogen, UA: 0.2 U/dL
pH, UA: 7 (ref 5.0–8.0)

## 2024-01-08 MED ORDER — ONDANSETRON 8 MG PO TBDP: 8.0000 mg | ORAL_TABLET | Freq: Three times a day (TID) | ORAL | 0 refills | Status: DC | PRN

## 2024-01-08 MED ORDER — DICYCLOMINE HCL 10 MG PO CAPS
10.0000 mg | ORAL_CAPSULE | Freq: Three times a day (TID) | ORAL | 0 refills | Status: DC
Start: 2024-01-08 — End: 2024-04-27

## 2024-01-08 NOTE — ED Provider Notes (Signed)
 [facility]  Note:  This document was prepared using Conservation officer, historic buildings and may include unintentional dictation errors.  MRN: [id number] DOB: 02/05/1971  Subjective:   Cheryl Patrick is a 53 y.o. female presenting for 5 day history of persistent intermittent diarrhea, nausea, scratchy throat, intermittent headaches, facial flushing. Initially her symptoms improved after 2 days and then returned in the past day. Has had 3 total episodes of diarrhea. Denies bloody stools. No fever, recent antibiotic use, hospitalizations or long distance travel.  Has not eaten raw foods, drank unfiltered water.  No history of GI disorders including Crohn's, IBS, ulcerative colitis.  No respiratory systems, chest pain, shortness of breath, vomiting, rashes, changes to urinary habits.  No current facility-administered medications for this encounter.  Current Outpatient Medications:    atorvastatin  (LIPITOR) 40 MG tablet, Take 1 tablet (40 mg total) by mouth daily., Disp: 90 tablet, Rfl: 1   gabapentin  (NEURONTIN ) 100 MG capsule, Take 1 capsule (100 mg total) by mouth 2 (two) times daily., Disp: 60 capsule, Rfl: 3   hydrocortisone  (ANUSOL -HC) 2.5 % rectal cream, Place 1 Application rectally 2 (two) times daily., Disp: 60 g, Rfl: 1   metFORMIN  (GLUCOPHAGE ) 500 MG tablet, TAKE 2 TABLETS BY MOUTH TWICE DAILY WITH MEALS, Disp: 360 tablet, Rfl: 1   omeprazole  (PRILOSEC) 20 MG capsule, Take 1 capsule (20 mg total) by mouth 2 (two) times daily before a meal., Disp: 90 capsule, Rfl: 1   acetaminophen  (TYLENOL ) 500 MG tablet, Take 500 mg by mouth every 6 (six) hours as needed for moderate pain., Disp: , Rfl:    senna-docusate (SENOKOT-S) 8.6-50 MG tablet, Take 2 tablets by mouth daily., Disp: 60 tablet, Rfl: 3   No Known Allergies  Past Medical History:  Diagnosis Date   Anxiety    Diabetes mellitus without complication (HCC)    Hyperlipemia      Past Surgical History:   Procedure Laterality Date   DILATION AND EVACUATION N/A 05/10/2017   Procedure: DILATATION AND EVACUATION;  Surgeon: Raynell Caller, MD;  Location: [facility];  Service: Gynecology;  Laterality: N/A;   NO PAST SURGERIES      Family History  Problem Relation Age of Onset   Lymphoma Mother    Hypertension Mother    Diabetes Mother    Diabetes Father    Hypertension Father    Breast cancer Neg Hx     Social History   Tobacco Use   Smoking status: Never   Smokeless tobacco: Never  Vaping Use   Vaping status: Never Used  Substance Use Topics   Alcohol use: No   Drug use: No    ROS   Objective:   Vitals: BP 121/71 (BP Location: Right Arm)   Pulse 71   Temp 98.1 F (36.7 C) (Oral)   Resp 16   LMP 03/31/2021 (Exact Date)   SpO2 97%   Physical Exam Constitutional:      General: She is not in acute distress.    Appearance: Normal appearance. She is well-developed. She is not ill-appearing, toxic-appearing or diaphoretic.  HENT:     Head: Normocephalic and atraumatic.     Nose: Nose normal.     Mouth/Throat:     Mouth: Mucous membranes are moist.     Pharynx: No pharyngeal swelling, oropharyngeal exudate, posterior oropharyngeal erythema or uvula swelling.     Tonsils: No tonsillar exudate or tonsillar abscesses. 0 on the right. 0 on the left.   Eyes:  General: No scleral icterus.       Right eye: No discharge.        Left eye: No discharge.     Extraocular Movements: Extraocular movements intact.     Conjunctiva/sclera: Conjunctivae normal.    Cardiovascular:     Rate and Rhythm: Normal rate and regular rhythm.     Heart sounds: Normal heart sounds. No murmur heard.    No friction rub. No gallop.  Pulmonary:     Effort: Pulmonary effort is normal. No respiratory distress.     Breath sounds: No stridor. No wheezing, rhonchi or rales.  Chest:     Chest wall: No tenderness.  Abdominal:     General: Bowel sounds are increased. There is no distension.      Palpations: Abdomen is soft. There is no mass.     Tenderness: There is no abdominal tenderness. There is no right CVA tenderness, left CVA tenderness, guarding or rebound.   Skin:    General: Skin is warm and dry.   Neurological:     General: No focal deficit present.     Mental Status: She is alert and oriented to person, place, and time.   Psychiatric:        Mood and Affect: Mood normal.        Behavior: Behavior normal.        Thought Content: Thought content normal.        Judgment: Judgment normal.     Results for orders placed or performed during the hospital encounter of 01/08/24 (from the past 24 hours)  POCT urinalysis dipstick     Status: Abnormal   Collection Time: 01/08/24  6:28 PM  Result Value Ref Range   Color, UA yellow yellow   Clarity, UA hazy (A) clear   Glucose, UA negative negative mg/dL   Bilirubin, UA negative negative   Ketones, POC UA trace (5) (A) negative mg/dL   Spec Grav, UA 8.295 6.213 - 1.025   Blood, UA negative negative   pH, UA 7.0 5.0 - 8.0   Protein Ur, POC negative negative mg/dL   Urobilinogen, UA 0.2 0.2 or 1.0 E.U./dL   Nitrite, UA Negative Negative   Leukocytes, UA Negative Negative    Assessment and Plan :   PDMP not reviewed this encounter.  1. Viral gastroenteritis    Will manage for suspected viral gastroenteritis with supportive care.  Recommended patient hydrate well, eat light meals and maintain electrolytes.  Will use Zofran  and Bentyl for nausea, vomiting and diarrhea. Counseled patient on potential for adverse effects with medications prescribed/recommended today, ER and return-to-clinic precautions discussed, patient verbalized understanding.    Adolph Hoop, [place] 01/08/24 [id number]

## 2024-01-08 NOTE — ED Triage Notes (Signed)
 Patient c/o diarrhea and vomiting x 1 week. Patient has not been able to keep much on her stomach. Patient has taken Imodium for relief.

## 2024-01-08 NOTE — Telephone Encounter (Signed)
 FYI Only or Action Required?: FYI only for provider  Patient was last seen in primary care on 10/27/2023 by Collins Dean, NP. Called Nurse Triage reporting Sore Throat. Symptoms began several days ago. Interventions attempted: Nothing. Symptoms are: unchanged.  Triage Disposition: See Physician Within 24 Hours  Patient/caregiver understands and will follow disposition?:         Interpreter ID: [id number]         Copied From CRM [id number]. Reason for Triage:  [facility]   Patient states she has been having diarrhea, vomiting & sore throat since Sunday. No appointments in clinic available till July. Please reach out to patient [phone]  Reason for Disposition  SEVERE (e.g., excruciating) throat pain  Answer Assessment - Initial Assessment Questions 1. ONSET: When did the throat start hurting? (Hours or days ago)      Last Sunday 2. SEVERITY: How bad is the sore throat? (Scale 1-10; mild, moderate or severe)   - MILD (1-3):  Doesn't interfere with eating or normal activities.   - MODERATE (4-7): Interferes with eating some solids and normal activities.   - SEVERE (8-10):  Excruciating pain, interferes with most normal activities.   - SEVERE WITH DYSPHAGIA (10): Can't swallow liquids, drooling.     Severe, at night cough is worsening. 3. STREP EXPOSURE: Has there been any exposure to strep within the past week? If Yes, ask: What type of contact occurred?      Unknown, last week grand daughters were ill. 4.  VIRAL SYMPTOMS: Are there any symptoms of a cold, such as a runny nose, cough, hoarse voice or red eyes?      Congestion present with stuffy nose, clear mucus, watery eyes.  5. FEVER: Do you have a fever? If Yes, ask: What is your temperature, how was it measured, and when did it start?   Unknown, at times she feels hot in the face.     7. OTHER SYMPTOMS: Do you have any other symptoms? (e.g., difficulty breathing, headache, rash)     Headache    Headache present as well. Pt. Has had 6 loose stools in the past 24 hours, she has vomited 7 times in the last 24 hours. She is able to swallow.  Protocols used: Sore Throat-A-AH

## 2024-01-08 NOTE — Telephone Encounter (Signed)
 She needs to go to urgent care. I should not be seeing her 2 weeks from now for acute symptoms.

## 2024-01-09 NOTE — Telephone Encounter (Signed)
 Unable to reach patient by phone to relay response from provider.  Unable to leave voicemail.

## 2024-01-26 ENCOUNTER — Encounter: Payer: Self-pay | Admitting: Nurse Practitioner

## 2024-01-26 ENCOUNTER — Ambulatory Visit: Payer: Self-pay | Attending: Nurse Practitioner | Admitting: Nurse Practitioner

## 2024-01-26 VITALS — BP 108/74 | HR 89 | Resp 19 | Ht 62.0 in | Wt 151.2 lb

## 2024-01-26 DIAGNOSIS — D649 Anemia, unspecified: Secondary | ICD-10-CM

## 2024-01-26 DIAGNOSIS — J069 Acute upper respiratory infection, unspecified: Secondary | ICD-10-CM

## 2024-01-26 DIAGNOSIS — J208 Acute bronchitis due to other specified organisms: Secondary | ICD-10-CM

## 2024-01-26 LAB — POC COVID19/FLU A&B COMBO
Covid Antigen, POC: NEGATIVE
Influenza A Antigen, POC: NEGATIVE
Influenza B Antigen, POC: NEGATIVE

## 2024-01-26 MED ORDER — PROMETHAZINE-DM 6.25-15 MG/5ML PO SYRP: 5.0000 mL | ORAL_SOLUTION | Freq: Four times a day (QID) | ORAL | 0 refills | Status: DC | PRN

## 2024-01-26 MED ORDER — PREDNISONE 20 MG PO TABS
20.0000 mg | ORAL_TABLET | Freq: Every day | ORAL | 0 refills | Status: AC
Start: 2024-01-26 — End: 2024-01-31

## 2024-01-26 NOTE — Progress Notes (Signed)
 Assessment & Plan:  Cheryl Patrick was seen today for cough and headache.  Diagnoses and all orders for this visit:  Viral bronchitis -     promethazine -dextromethorphan  (PROMETHAZINE -DM) 6.25-15 MG/5ML syrup; Take 5 mLs by mouth 4 (four) times daily as needed. -     predniSONE  (DELTASONE ) 20 MG tablet; Take 1 tablet (20 mg total) by mouth daily with breakfast for 5 days.  Low hemoglobin -     CBC with Differential  URI with cough and congestion NEGATIVE -     POC Covid19/Flu A&B Antigen    Patient has been counseled on age-appropriate routine health concerns for screening and prevention. These are reviewed and up-to-date. Referrals have been placed accordingly. Immunizations are up-to-date or declined.    Subjective:   Chief Complaint  Patient presents with   Cough   Headache    Cheryl Patrick 53 y.o. female presents to office today with complaints of symptoms of URI with cough and congestion.    VRI was used to communicate directly with patient for the entire encounter including providing detailed patient instructions.     Over the past week Cheryl Patrick has been experiencing cough, headache, chills and initially N/V and diarrhea which has resolved. She has been taking advil  and tylenol  for cough. Does not feel like she is getting better Mucous production worse at night due to cough         Review of Systems  Constitutional:  Positive for chills. Negative for fever, malaise/fatigue and weight loss.  HENT: Negative.  Negative for nosebleeds.   Eyes: Negative.  Negative for blurred vision, double vision and photophobia.  Respiratory:  Positive for cough. Negative for shortness of breath.   Cardiovascular: Negative.  Negative for chest pain, palpitations and leg swelling.  Gastrointestinal: Negative.  Negative for heartburn, nausea and vomiting.  Musculoskeletal: Negative.  Negative for myalgias.  Neurological:  Positive for headaches. Negative for dizziness, focal  weakness and seizures.  Psychiatric/Behavioral: Negative.  Negative for suicidal ideas.     Past Medical History:  Diagnosis Date   Anxiety    Diabetes mellitus without complication (HCC)    Hyperlipemia     Past Surgical History:  Procedure Laterality Date   DILATION AND EVACUATION N/A 05/10/2017   Procedure: DILATATION AND EVACUATION;  Surgeon: Izell Harari, MD;  Location: [facility];  Service: Gynecology;  Laterality: N/A;   NO PAST SURGERIES      Family History  Problem Relation Age of Onset   Lymphoma Mother    Hypertension Mother    Diabetes Mother    Diabetes Father    Hypertension Father    Breast cancer Neg Hx     Social History Reviewed with no changes to be made today.   Outpatient Medications Prior to Visit  Medication Sig Dispense Refill   acetaminophen  (TYLENOL ) 500 MG tablet Take 500 mg by mouth every 6 (six) hours as needed for moderate pain.     atorvastatin  (LIPITOR) 40 MG tablet Take 1 tablet (40 mg total) by mouth daily. 90 tablet 1   gabapentin  (NEURONTIN ) 100 MG capsule Take 1 capsule (100 mg total) by mouth 2 (two) times daily. 60 capsule 3   metFORMIN  (GLUCOPHAGE ) 500 MG tablet TAKE 2 TABLETS BY MOUTH TWICE DAILY WITH MEALS 360 tablet 1   omeprazole  (PRILOSEC) 20 MG capsule Take 1 capsule (20 mg total) by mouth 2 (two) times daily before a meal. 90 capsule 1   dicyclomine  (BENTYL ) 10 MG capsule Take 1 capsule (  10 mg total) by mouth 4 (four) times daily -  before meals and at bedtime. (Patient not taking: Reported on 01/26/2024) 20 capsule 0   hydrocortisone  (ANUSOL -HC) 2.5 % rectal cream Place 1 Application rectally 2 (two) times daily. (Patient not taking: Reported on 01/26/2024) 60 g 1   ondansetron  (ZOFRAN -ODT) 8 MG disintegrating tablet Take 1 tablet (8 mg total) by mouth every 8 (eight) hours as needed for nausea or vomiting. (Patient not taking: Reported on 01/26/2024) 20 tablet 0   senna-docusate (SENOKOT-S) 8.6-50 MG tablet Take 2 tablets by mouth  daily. (Patient not taking: Reported on 01/26/2024) 60 tablet 3   No facility-administered medications prior to visit.    No Known Allergies     Objective:    BP 108/74 (BP Location: Left Arm, Patient Position: Sitting, Cuff Size: Normal)   Pulse 89   Resp 19   Ht 5' 2 (1.575 m)   Wt 151 lb 3.2 oz (68.6 kg)   LMP 03/31/2021 (Exact Date)   SpO2 100%   BMI 27.65 kg/m  Wt Readings from Last 3 Encounters:  01/26/24 151 lb 3.2 oz (68.6 kg)  10/27/23 154 lb 12.8 oz (70.2 kg)  09/23/23 153 lb 9.6 oz (69.7 kg)    Physical Exam Vitals and nursing note reviewed.  Constitutional:      Appearance: She is well-developed.  HENT:     Head: Normocephalic and atraumatic.   Cardiovascular:     Rate and Rhythm: Normal rate and regular rhythm.     Heart sounds: Normal heart sounds. No murmur heard.    No friction rub. No gallop.  Pulmonary:     Effort: Pulmonary effort is normal. No tachypnea or respiratory distress.     Breath sounds: Normal breath sounds. No decreased breath sounds, wheezing, rhonchi or rales.  Chest:     Chest wall: No tenderness.  Abdominal:     General: Bowel sounds are normal.     Palpations: Abdomen is soft.   Musculoskeletal:        General: Normal range of motion.     Cervical back: Normal range of motion.   Skin:    General: Skin is warm and dry.   Neurological:     Mental Status: She is alert and oriented to person, place, and time.     Coordination: Coordination normal.   Psychiatric:        Behavior: Behavior normal. Behavior is cooperative.        Thought Content: Thought content normal.        Judgment: Judgment normal.          Patient has been counseled extensively about nutrition and exercise as well as the importance of adherence with medications and regular follow-up. The patient was given clear instructions to go to ER or return to medical center if symptoms don't improve, worsen or new problems develop. The patient verbalized  understanding.   Follow-up: Return in about 3 months (around 04/27/2024).   Cheryl LELON Servant, FNP-BC [facility] [place], [place] [phone]   01/26/2024, 12:29 PM

## 2024-01-27 LAB — CBC WITH DIFFERENTIAL/PLATELET
Basophils Absolute: 0 10*3/uL (ref 0.0–0.2)
Basos: 1 %
EOS (ABSOLUTE): 0.2 10*3/uL (ref 0.0–0.4)
Eos: 3 %
Hematocrit: 46.3 % (ref 34.0–46.6)
Hemoglobin: 14.6 g/dL (ref 11.1–15.9)
Immature Grans (Abs): 0 10*3/uL (ref 0.0–0.1)
Immature Granulocytes: 0 %
Lymphocytes Absolute: 2.4 10*3/uL (ref 0.7–3.1)
Lymphs: 42 %
MCH: 27.1 pg (ref 26.6–33.0)
MCHC: 31.5 g/dL (ref 31.5–35.7)
MCV: 86 fL (ref 79–97)
Monocytes Absolute: 0.5 10*3/uL (ref 0.1–0.9)
Monocytes: 10 %
Neutrophils Absolute: 2.5 10*3/uL (ref 1.4–7.0)
Neutrophils: 44 %
Platelets: 198 10*3/uL (ref 150–450)
RBC: 5.39 x10E6/uL — ABNORMAL HIGH (ref 3.77–5.28)
RDW: 13.6 % (ref 11.7–15.4)
WBC: 5.5 10*3/uL (ref 3.4–10.8)

## 2024-01-31 ENCOUNTER — Ambulatory Visit: Payer: Self-pay | Admitting: Nurse Practitioner

## 2024-03-17 ENCOUNTER — Ambulatory Visit: Payer: Self-pay | Admitting: Physician Assistant

## 2024-03-17 ENCOUNTER — Ambulatory Visit: Payer: Self-pay

## 2024-03-17 ENCOUNTER — Encounter: Payer: Self-pay | Admitting: Physician Assistant

## 2024-03-17 VITALS — BP 126/62 | HR 74 | Ht 62.0 in | Wt 151.0 lb

## 2024-03-17 DIAGNOSIS — H1032 Unspecified acute conjunctivitis, left eye: Secondary | ICD-10-CM

## 2024-03-17 MED ORDER — OFLOXACIN 0.3 % OP SOLN: OPHTHALMIC | 0 refills | Status: DC

## 2024-03-17 NOTE — Telephone Encounter (Signed)
 Assisted by Alfonso id # [id number] FYI Only or Action Required?: FYI only for provider.  Patient was last seen in primary care on 01/26/2024 by Theotis Haze ORN, NP.  Called Nurse Triage reporting Eye Drainage.  Symptoms began yesterday.  Interventions attempted: Other: OTC eye drops, .  Symptoms are: gradually worsening.  Triage Disposition: See Physician Within 24 Hours  Patient/caregiver understands and will follow disposition?: Yes- to Mobile Bus/no appts open in office         Copied from CRM #[id number]. Topic: Clinical - Red Word Triage >> Mar 17, 2024  7:51 AM Donna BRAVO wrote: Red Word that prompted transfer to Nurse Triage: patient left eye started swelling yesterday morning a little less swelling this morning, itching and red, eye is weeping Reason for Disposition  Eyelid is red and painful (or tender to touch)  Answer Assessment - Initial Assessment Questions 1. ONSET: When did the swelling start? (e.g., minutes, hours, days)     Yesterday  2. LOCATION: What part of the eyelids is swollen?     Left eye 3. SEVERITY: How swollen is it?     Can see  4. ITCHING: Is there any itching? If Yes, ask: How much?   (Scale 1-10; mild, moderate or severe)     yes 5. PAIN: Is the swelling painful to touch? If Yes, ask: How painful is it?   (Scale 1-10; mild, moderate or severe)     5 if touched press on side of and near eyebrow 6. FEVER: Do you have a fever? If Yes, ask: What is it, how was it measured, and when did it start?      7/10 9. OTHER SYMPTOMS: Do you have any other symptoms? (e.g., blurred vision, eye discharge, rash, runny nose)     Clear Eye discharge, redness to white of eye  Protocols used: Eye - Swelling-A-AH

## 2024-03-17 NOTE — Progress Notes (Unsigned)
 Established Patient Office Visit  Subjective   Patient ID: Cheryl Patrick, female    DOB: [birth year]  Age: 52 y.o. MRN: [id number]  Chief Complaint  Patient presents with   Eye Problem    Patient has been experiencing eye irritation and itchiness for the past 3 days. Yesterday she noticed left eye was swollen and sore to touch, and left side of face. She denies any new foods or skin care products used.   Discussed the use of AI scribe software for clinical note transcription with the patient, who gave verbal consent to proceed.  History of Present Illness   Cheryl Patrick is a 53 year old female who presents with irritation and itchiness in her eyes.  She has experienced irritation and itchiness in her eyes for three days, primarily affecting the left eye, with the right eye slightly watery. There is no trauma or foreign body sensation. She applied ice to the area for relief. Yesterday, her eye was 'stuck together' and required cleaning. There is no ear or throat pain, and no sinus drainage. Her son recently had the flu.  Due to language barrier, an interpreter was present during the history-taking and subsequent discussion (and for part of the physical exam) with this patient.     Past Medical History:  Diagnosis Date   Anxiety    Diabetes mellitus without complication (HCC)    Hyperlipemia    Social History   Socioeconomic History   Marital status: Married    Spouse name: Not on file   Number of children: 3   Years of education: Not on file   Highest education level: 9th grade  Occupational History   Occupation: International aid/development worker: [employer]  Tobacco Use   Smoking status: Never   Smokeless tobacco: Never  Vaping Use   Vaping status: Never Used  Substance and Sexual Activity   Alcohol use: No   Drug use: No   Sexual activity: Yes    Birth control/protection: Post-menopausal  Other Topics Concern   Not on file  Social History Narrative   Not on file    Social Drivers of Health   Financial Resource Strain: Not on file  Food Insecurity: No Food Insecurity (06/25/2022)   Hunger Vital Sign    Worried About Running Out of Food in the Last Year: Never true    Ran Out of Food in the Last Year: Never true  Transportation Needs: No Transportation Needs (07/29/2023)   PRAPARE - Administrator, Civil Service (Medical): No    Lack of Transportation (Non-Medical): No  Physical Activity: Not on file  Stress: Not on file  Social Connections: Not on file  Intimate Partner Violence: Not on file   Family History  Problem Relation Age of Onset   Lymphoma Mother    Hypertension Mother    Diabetes Mother    Diabetes Father    Hypertension Father    Breast cancer Neg Hx    No Known Allergies  Review of Systems  Constitutional:  Negative for chills.  HENT:  Negative for ear pain.   Eyes:  Positive for blurred vision, discharge and redness. Negative for pain.  Respiratory:  Negative for shortness of breath.   Cardiovascular:  Negative for chest pain.  Gastrointestinal: Negative.   Genitourinary: Negative.   Musculoskeletal: Negative.   Skin: Negative.   Neurological: Negative.   Endo/Heme/Allergies: Negative.   Psychiatric/Behavioral: Negative.        Objective:  BP 126/62 (BP Location: Left Arm, Patient Position: Sitting, Cuff Size: Large)   Pulse 74   Ht 5' 2 (1.575 m)   Wt 151 lb (68.5 kg)   LMP 03/31/2021 (Exact Date)   SpO2 97%   BMI 27.62 kg/m  BP Readings from Last 3 Encounters:  03/17/24 126/62  01/26/24 108/74  01/08/24 121/71   Wt Readings from Last 3 Encounters:  03/17/24 151 lb (68.5 kg)  01/26/24 151 lb 3.2 oz (68.6 kg)  10/27/23 154 lb 12.8 oz (70.2 kg)    Physical Exam Vitals and nursing note reviewed.  Constitutional:      Appearance: Normal appearance.  HENT:     Head: Normocephalic and atraumatic.     Right Ear: External ear normal.     Left Ear: External ear normal.     Nose:  Nose normal.     Mouth/Throat:     Mouth: Mucous membranes are moist.     Pharynx: Oropharynx is clear.  Eyes:     General:        Right eye: No foreign body or discharge.        Left eye: Discharge present.No foreign body.     Extraocular Movements:     Right eye: Normal extraocular motion.     Left eye: Normal extraocular motion.     Conjunctiva/sclera:     Right eye: Right conjunctiva is not injected. No exudate.    Left eye: Left conjunctiva is injected. Exudate present.  Cardiovascular:     Rate and Rhythm: Normal rate and regular rhythm.     Pulses: Normal pulses.     Heart sounds: Normal heart sounds.  Pulmonary:     Effort: Pulmonary effort is normal.     Breath sounds: Normal breath sounds.  Musculoskeletal:        General: Normal range of motion.     Cervical back: Normal range of motion and neck supple.  Skin:    General: Skin is warm and dry.  Neurological:     General: No focal deficit present.     Mental Status: She is alert and oriented to person, place, and time.  Psychiatric:        Mood and Affect: Mood normal.        Behavior: Behavior normal.        Thought Content: Thought content normal.        Judgment: Judgment normal.        Assessment & Plan:   Problem List Items Addressed This Visit   None Visit Diagnoses       Acute bacterial conjunctivitis of left eye    -  Primary   Relevant Medications   ofloxacin  (OCUFLOX ) 0.3 % ophthalmic solution     1. Acute bacterial conjunctivitis of left eye (Primary) Trial Ocuflox , patient education given on supportive care, red flags given for prompt reevaluation. - ofloxacin  (OCUFLOX ) 0.3 % ophthalmic solution; Instill 2 drops in left eye every 2 to 4 hours for the first 2 days, then instill 2 drops 4 times daily for an additional 5 days.  Dispense: 5 mL; Refill: 0   I have reviewed the patient's medical history (PMH, PSH, Social History, Family History, Medications, and allergies) , and have been updated  if relevant. I spent 30 minutes reviewing chart and  face to face time with patient.     Return if symptoms worsen or fail to improve.    Kirk RAMAN Mayers, PA-C

## 2024-03-17 NOTE — Telephone Encounter (Signed)
 Noted

## 2024-03-17 NOTE — Patient Instructions (Signed)
 VISIT SUMMARY:  Today, you were seen for irritation and itchiness in your eyes, which has been affecting you for the past three days. Your left eye is primarily affected, while your right eye is slightly watery. You mentioned that your eye was 'stuck together' yesterday and required cleaning. There is no associated pain in your ears or throat, and no sinus drainage. Your son recently had the flu.  YOUR PLAN:  -Left ACUTE CONJUNCTIVITIS: Bilateral acute conjunctivitis is an inflammation of the outermost layer of the white part of the eye and the inner surface of the eyelid.  The cause of your conjunctivitis is unclear. You have been prescribed antibiotic eye drops to be used as follows: two drops in the affected eye every 2-4 hours for the first 2 days, then four times daily for the next 5 days. Additionally, you should avoid touching your eyes to prevent spreading the infection.  Conjuntivitis bacteriana en los adultos Bacterial Conjunctivitis, Adult La conjuntivitis bacteriana es una infeccin de la membrana transparente que cubre la parte blanca del ojo y la cara interna del prpado (conjuntiva). Cuando los vasos sanguneos de la conjuntiva se inflaman, el ojo se pone de color rojo o rosado. A menudo, el ojo se siente irritado o con picazn. La conjuntivitis bacteriana se transmite fcilmente de burkina faso persona a la otra (es contagiosa). Tambin se contagia fcilmente de un ojo al otro. Cules son las causas? Esta afeccin est causada por bacterias. Puede contraer la infeccin si entra en contacto estrecho con: Una persona que est infectada por la bacteria. Elementos que estn contaminados con la bacteria, como una toalla para la cara, solucin para lentes de contacto o maquillaje para ojos. Qu incrementa el riesgo? Es ms probable que contraiga esta afeccin si: Mantiene contacto fsico con personas que tienen la infeccin. [place]  lentes de contacto. Tiene sinusitis. Ha tenido una lesin o  ciruga reciente en el ojo. Tiene debilitado el sistema de defensa del organismo (sistema inmunitario). Tiene una afeccin mdica que causa sequedad en los ojos. Cules son los signos o sntomas? Los sntomas de esta afeccin incluyen: Secrecin espesa y Social worker del ojo. Esta secrecin puede convertirse en una costra en el prpado durante la noche, que hace que los prpados se peguen. Lagrimeo u ojos llorosos. Picazn en los ojos. Sensacin de ardor en los ojos. Ojos rojos. Hinchazn de los prpados. Visin borrosa. Cmo se diagnostica? Esta afeccin se diagnostica en funcin de los sntomas y los antecedentes mdicos. El mdico tambin puede obtener una muestra de la secrecin del ojo para averiguar la causa de la infeccin. Cmo se trata? El tratamiento de esta afeccin puede incluir: Gotas o ungento para los ojos con antibitico para English as a second language teacher infeccin con ms rapidez y evitar el contagio a Economist. Antibiticos tomados por boca (por va oral) para tratar infecciones que no responden a las gotas o los ungentos, o que duran ms de [address]. Paos hmedos fros (compresas fras) sobre los ojos. Lgrimas artificiales aplicadas 2 a 6 veces por da. Siga estas instrucciones en su casa: Medicamentos Tome los antibiticos o aplqueselos como se lo haya indicado el mdico. No deje de usar el antibitico, aunque la afeccin mejore, a menos que se lo indique el mdico. Use o aplquese los medicamentos de venta libre y los recetados solamente como se lo haya indicado el mdico. Tenga mucho cuidado de no tocar el borde del prpado con el frasco de las gotas para los ojos o el tubo del ungento cuando [employer]  medicamentos en el ojo afectado. Esto evitar que se contagie la infeccin al otro ojo o a Economist. Control de las molestias Retire suavemente la secrecin de los ojos con un pao tibio y hmedo o con una torunda de algodn. Aplquese una compresa fra y limpia en  el ojo durante 10 a 20 minutos, 3 o 4 veces al da. Instrucciones generales No use lentes de contacto hasta que haya desaparecido la inflamacin y su mdico le indique que es seguro usarlos nuevamente. Pregntele al mdico cmo esterilizar o reemplazar sus lentes de contacto antes de usarlos nuevamente. Use anteojos hasta que pueda volver a usar los lentes de [place]. Evite usar [employer] ojos hasta que la inflamacin se haya ido. Descarte cosmticos viejos para los ojos que puedan estar contaminados. Cambie o lave su almohada todos los [place]. No comparta las toallas o los paos. Esto puede propagar la infeccin. Lvese las manos con agua y jabn con frecuencia durante al menos 20 segundos y especialmente antes de tocarse la cara o los ojos. Use toallas de papel para secarse las manos. Evite tocarse o frotarse los ojos. No conduzca ni use maquinaria pesada si su visin es borrosa. Comunquese con un mdico si: Tiene fiebre. Los sntomas no mejoran despus de [address] de [place]. Solicite ayuda de inmediato si: Tiene fiebre y los sntomas empeoran repentinamente. Siente dolor intenso cuando mueve el ojo. Siente dolor u observa hinchazn o enrojecimiento en la cara. Pierde la visin repentinamente. Resumen La conjuntivitis bacteriana es una infeccin de la membrana transparente que cubre la parte blanca del ojo y la cara interna del prpado (conjuntiva). La conjuntivitis bacteriana se transmite fcilmente de un ojo al otro y de ignacia persona a la otra (es contagiosa). Lvese las manos con agua y jabn con frecuencia durante al menos 20 segundos y especialmente antes de tocarse la cara o los ojos. Use toallas de papel para secarse las manos. Tome los antibiticos o aplqueselos como se lo haya indicado el mdico. No deje de usar el antibitico aunque la afeccin mejore. Comunquese con un mdico si tiene fiebre o si sus sntomas no mejoran despus de 10 das. Busque ayuda de inmediato si  tiene prdida de la visin repentina. Esta informacin no tiene Theme park manager el consejo del mdico. Asegrese de hacerle al mdico cualquier pregunta que tenga. Document Revised: 11/16/2020 Document Reviewed: 11/16/2020 Elsevier Patient Education  2024 [employer].

## 2024-03-18 ENCOUNTER — Encounter: Payer: Self-pay | Admitting: Physician Assistant

## 2024-04-27 ENCOUNTER — Encounter: Payer: Self-pay | Admitting: Nurse Practitioner

## 2024-04-27 ENCOUNTER — Ambulatory Visit: Payer: Self-pay | Attending: Nurse Practitioner | Admitting: Nurse Practitioner

## 2024-04-27 VITALS — BP 119/71 | HR 70 | Resp 19 | Ht 62.0 in | Wt 151.6 lb

## 2024-04-27 DIAGNOSIS — Z1231 Encounter for screening mammogram for malignant neoplasm of breast: Secondary | ICD-10-CM

## 2024-04-27 DIAGNOSIS — E782 Mixed hyperlipidemia: Secondary | ICD-10-CM

## 2024-04-27 DIAGNOSIS — Z7984 Long term (current) use of oral hypoglycemic drugs: Secondary | ICD-10-CM

## 2024-04-27 DIAGNOSIS — E1165 Type 2 diabetes mellitus with hyperglycemia: Secondary | ICD-10-CM

## 2024-04-27 DIAGNOSIS — Z79899 Other long term (current) drug therapy: Secondary | ICD-10-CM

## 2024-04-27 DIAGNOSIS — H1013 Acute atopic conjunctivitis, bilateral: Secondary | ICD-10-CM

## 2024-04-27 DIAGNOSIS — Z23 Encounter for immunization: Secondary | ICD-10-CM

## 2024-04-27 LAB — POCT GLYCOSYLATED HEMOGLOBIN (HGB A1C): HbA1c, POC (controlled diabetic range): 6.2 % (ref 0.0–7.0)

## 2024-04-27 MED ORDER — METFORMIN HCL 500 MG PO TABS: 1000.0000 mg | ORAL_TABLET | Freq: Two times a day (BID) | ORAL | 1 refills | Status: AC

## 2024-04-27 MED ORDER — OLOPATADINE HCL 0.2 % OP SOLN: 1.0000 [drp] | Freq: Every day | OPHTHALMIC | 1 refills | Status: AC

## 2024-04-27 NOTE — Patient Instructions (Addendum)
 PATADAY ZATIDOR OPCON  VISINE A

## 2024-04-27 NOTE — Progress Notes (Signed)
 Assessment & Plan:  Dacy was seen today for diabetes.  Diagnoses and all orders for this visit:  Type 2 diabetes mellitus with hyperglycemia, without long-term current use of insulin  (HCC) -     POCT glycosylated hemoglobin (Hb A1C) -     CMP14+EGFR A1c improved from 6.5 to 6.2, indicating good glycemic control.  Need for influenza vaccination -     Flu vaccine trivalent PF, 6mos and older(Flulaval,Afluria,Fluarix,Fluzone)  Mixed hyperlipidemia -     Lipid panel  Breast cancer screening by mammogram -     MS 3D SCR MAMMO BILAT BR (aka MM); Future  Allergic conjunctivitis of both eyes -     Olopatadine HCl 0.2 % SOLN; Apply 1 drop to eye daily. Intermittent itching and crusting suggest allergic conjunctivitis. Ineffective response to antibiotic eye drops indicates need for allergy-specific treatment. - Discontinue antibiotic eye drops. - Prescribe allergy eye drops. - Provide names of over-the-counter allergy eye drops if prescription drops are too expensive.  General Health Maintenance Overdue for mammogram screening. - Provide scholarship form for mammogram. - Check cholesterol levels. - Check kidney function Patient has been counseled on age-appropriate routine health concerns for screening and prevention. These are reviewed and up-to-date. Referrals have been placed accordingly. Immunizations are up-to-date or declined.    Subjective:   Chief Complaint  Patient presents with   Diabetes    Cheryl Patrick 53 y.o. female presents to office today for Diabetes follow up and concerns of itchy eyes and a sensation of bilateral clogged ears.  [name] Spanish Interpreter [phone] She has a past medical history of Anxiety, DM2, and Hyperlipemia.   She experiences significant itching in her eyes, particularly in the mornings, with left eye being more affected than the right. She previously has been prescribed antibiotic eye drops but has never been prescribed allergy  eyedrops . She also has a sensation of her ears feeling clogged, which has persisted since last week and was noticeable yesterday morning. Despite attempts to clean her ears, she feels they remain blocked. She mentions that she does take showers     DM 2 A1c is at goal of less than 6.5 with metformin  1000 mg BID Lab Results  Component Value Date   HGBA1C 6.2 04/27/2024     She is not currently taking atorvastatin  Lab Results  Component Value Date   LDLCALC 105 (H) 10/27/2023  The 10-year ASCVD risk score (Arnett DK, et al., 2019) is: 3%   Values used to calculate the score:     Age: 31 years     Clincally relevant sex: Female     Is Non-Hispanic African American: No     Diabetic: Yes     Tobacco smoker: No     Systolic Blood Pressure: 119 mmHg     Is BP treated: No     HDL Cholesterol: 41 mg/dL     Total Cholesterol: 178 mg/dL     Blood pressure is well-controlled.  She is currently not on an ACE or ARB BP Readings from Last 3 Encounters:  04/27/24 119/71  03/17/24 126/62  01/26/24 108/74    Review of Systems  Constitutional:  Negative for fever, malaise/fatigue and weight loss.  HENT:  Positive for congestion. Negative for ear discharge, ear pain, hearing loss, nosebleeds, sinus pain, sore throat and tinnitus.        SEE HPI  Eyes: Negative.  Negative for blurred vision, double vision and photophobia.  Respiratory: Negative.  Negative for cough, shortness of  breath and stridor.   Cardiovascular: Negative.  Negative for chest pain, palpitations and leg swelling.  Gastrointestinal: Negative.  Negative for heartburn, nausea and vomiting.  Musculoskeletal: Negative.  Negative for myalgias.  Neurological: Negative.  Negative for dizziness, focal weakness, seizures and headaches.  Psychiatric/Behavioral: Negative.  Negative for suicidal ideas.     Past Medical History:  Diagnosis Date   Anxiety    Diabetes mellitus without complication (HCC)    Hyperlipemia     Past  Surgical History:  Procedure Laterality Date   DILATION AND EVACUATION N/A 05/10/2017   Procedure: DILATATION AND EVACUATION;  Surgeon: Izell Harari, MD;  Location: [facility];  Service: Gynecology;  Laterality: N/A;   NO PAST SURGERIES      Family History  Problem Relation Age of Onset   Lymphoma Mother    Hypertension Mother    Diabetes Mother    Diabetes Father    Hypertension Father    Breast cancer Neg Hx     Social History Reviewed with no changes to be made today.   Outpatient Medications Prior to Visit  Medication Sig Dispense Refill   atorvastatin  (LIPITOR) 40 MG tablet Take 1 tablet (40 mg total) by mouth daily. 90 tablet 1   gabapentin  (NEURONTIN ) 100 MG capsule Take 1 capsule (100 mg total) by mouth 2 (two) times daily. 60 capsule 3   ofloxacin  (OCUFLOX ) 0.3 % ophthalmic solution Instill 2 drops in left eye every 2 to 4 hours for the first 2 days, then instill 2 drops 4 times daily for an additional 5 days. 5 mL 0   omeprazole  (PRILOSEC) 20 MG capsule Take 1 capsule (20 mg total) by mouth 2 (two) times daily before a meal. 90 capsule 1   acetaminophen  (TYLENOL ) 500 MG tablet Take 500 mg by mouth every 6 (six) hours as needed for moderate pain. (Patient not taking: Reported on 04/27/2024)     dicyclomine  (BENTYL ) 10 MG capsule Take 1 capsule (10 mg total) by mouth 4 (four) times daily -  before meals and at bedtime. (Patient not taking: Reported on 04/27/2024) 20 capsule 0   hydrocortisone  (ANUSOL -HC) 2.5 % rectal cream Place 1 Application rectally 2 (two) times daily. (Patient not taking: Reported on 04/27/2024) 60 g 1   metFORMIN  (GLUCOPHAGE ) 500 MG tablet TAKE 2 TABLETS BY MOUTH TWICE DAILY WITH MEALS (Patient not taking: Reported on 04/27/2024) 360 tablet 1   ondansetron  (ZOFRAN -ODT) 8 MG disintegrating tablet Take 1 tablet (8 mg total) by mouth every 8 (eight) hours as needed for nausea or vomiting. (Patient not taking: Reported on 04/27/2024) 20 tablet 0    promethazine -dextromethorphan  (PROMETHAZINE -DM) 6.25-15 MG/5ML syrup Take 5 mLs by mouth 4 (four) times daily as needed. (Patient not taking: Reported on 04/27/2024) 240 mL 0   senna-docusate (SENOKOT-S) 8.6-50 MG tablet Take 2 tablets by mouth daily. (Patient not taking: Reported on 04/27/2024) 60 tablet 3   No facility-administered medications prior to visit.    No Known Allergies     Objective:    BP 119/71 (BP Location: Left Arm, Patient Position: Sitting, Cuff Size: Normal)   Pulse 70   Resp 19   Ht 5' 2 (1.575 m)   Wt 151 lb 9.6 oz (68.8 kg)   LMP 03/31/2021 (Exact Date)   SpO2 100%   BMI 27.73 kg/m  Wt Readings from Last 3 Encounters:  04/27/24 151 lb 9.6 oz (68.8 kg)  03/17/24 151 lb (68.5 kg)  01/26/24 151 lb 3.2 oz (68.6 kg)  Physical Exam Vitals and nursing note reviewed.  Constitutional:      Appearance: She is well-developed.  HENT:     Head: Normocephalic and atraumatic.     Right Ear: Hearing, tympanic membrane, ear canal and external ear normal.     Left Ear: Hearing, tympanic membrane, ear canal and external ear normal.  Cardiovascular:     Rate and Rhythm: Normal rate and regular rhythm.     Heart sounds: Normal heart sounds. No murmur heard.    No friction rub. No gallop.  Pulmonary:     Effort: Pulmonary effort is normal. No tachypnea or respiratory distress.     Breath sounds: Normal breath sounds. No decreased breath sounds, wheezing, rhonchi or rales.  Chest:     Chest wall: No tenderness.  Musculoskeletal:        General: Normal range of motion.     Cervical back: Normal range of motion.  Skin:    General: Skin is warm and dry.  Neurological:     Mental Status: She is alert and oriented to person, place, and time.     Coordination: Coordination normal.  Psychiatric:        Behavior: Behavior normal. Behavior is cooperative.        Thought Content: Thought content normal.        Judgment: Judgment normal.          Patient has been  counseled extensively about nutrition and exercise as well as the importance of adherence with medications and regular follow-up. The patient was given clear instructions to go to ER or return to medical center if symptoms don't improve, worsen or new problems develop. The patient verbalized understanding.   Follow-up: Return in about 6 months (around 10/25/2024).   Haze LELON Servant, FNP-BC [facility] and [facility] [place], [place] [phone]   04/27/2024, 9:44 AM

## 2024-04-28 ENCOUNTER — Ambulatory Visit: Payer: Self-pay | Admitting: Nurse Practitioner

## 2024-04-28 DIAGNOSIS — E785 Hyperlipidemia, unspecified: Secondary | ICD-10-CM

## 2024-04-28 LAB — CMP14+EGFR
ALT: 27 IU/L (ref 0–32)
AST: 25 IU/L (ref 0–40)
Albumin: 4.7 g/dL (ref 3.8–4.9)
Alkaline Phosphatase: 98 IU/L (ref 49–135)
BUN/Creatinine Ratio: 26 — ABNORMAL HIGH (ref 9–23)
BUN: 18 mg/dL (ref 6–24)
Bilirubin Total: 0.4 mg/dL (ref 0.0–1.2)
CO2: 22 mmol/L (ref 20–29)
Calcium: 10 mg/dL (ref 8.7–10.2)
Chloride: 103 mmol/L (ref 96–106)
Creatinine, Ser: 0.7 mg/dL (ref 0.57–1.00)
Globulin, Total: 2.4 g/dL (ref 1.5–4.5)
Glucose: 123 mg/dL — ABNORMAL HIGH (ref 70–99)
Potassium: 4.7 mmol/L (ref 3.5–5.2)
Sodium: 142 mmol/L (ref 134–144)
Total Protein: 7.1 g/dL (ref 6.0–8.5)
eGFR: 104 mL/min/1.73 (ref >59)

## 2024-04-28 LAB — LIPID PANEL
Chol/HDL Ratio: 5 ratio — ABNORMAL HIGH (ref 0.0–4.4)
Cholesterol, Total: 189 mg/dL (ref 100–199)
HDL: 38 mg/dL — ABNORMAL LOW (ref >39)
LDL Chol Calc (NIH): 104 mg/dL — ABNORMAL HIGH (ref 0–99)
Triglycerides: 275 mg/dL — ABNORMAL HIGH (ref 0–149)
VLDL Cholesterol Cal: 47 mg/dL — ABNORMAL HIGH (ref 5–40)

## 2024-04-28 MED ORDER — ATORVASTATIN CALCIUM 10 MG PO TABS: 10.0000 mg | ORAL_TABLET | Freq: Every day | ORAL | 3 refills | Status: AC

## 2024-05-14 ENCOUNTER — Telehealth: Payer: Self-pay

## 2024-05-14 NOTE — Telephone Encounter (Signed)
 Telephoned patient using inerpreter#[id number]. Left a voice message with [facility] (scholarship) contact information.

## 2024-06-15 ENCOUNTER — Ambulatory Visit
Admission: RE | Admit: 2024-06-15 | Discharge: 2024-06-15 | Disposition: A | Payer: Self-pay | Source: Ambulatory Visit | Attending: Nurse Practitioner

## 2024-06-15 DIAGNOSIS — Z1231 Encounter for screening mammogram for malignant neoplasm of breast: Secondary | ICD-10-CM

## 2024-11-02 ENCOUNTER — Ambulatory Visit: Payer: Self-pay | Admitting: Nurse Practitioner
